# Patient Record
Sex: Female | Born: 1965
Health system: Southern US, Community
[De-identification: ages and names within clinical notes are randomized; demographics above are authoritative.]

## PROBLEM LIST (undated history)

## (undated) ENCOUNTER — Emergency Department (HOSPITAL_COMMUNITY): Discharge status: Absent without leave | Payer: Self-pay

## (undated) DIAGNOSIS — E079 Disorder of thyroid, unspecified: Secondary | ICD-10-CM

## (undated) DIAGNOSIS — F329 Major depressive disorder, single episode, unspecified: Secondary | ICD-10-CM

## (undated) DIAGNOSIS — C801 Malignant (primary) neoplasm, unspecified: Secondary | ICD-10-CM

## (undated) DIAGNOSIS — I639 Cerebral infarction, unspecified: Secondary | ICD-10-CM

## (undated) DIAGNOSIS — K5190 Ulcerative colitis, unspecified, without complications: Secondary | ICD-10-CM

## (undated) DIAGNOSIS — I10 Essential (primary) hypertension: Secondary | ICD-10-CM

## (undated) DIAGNOSIS — F32A Depression, unspecified: Secondary | ICD-10-CM

## (undated) DIAGNOSIS — B1920 Unspecified viral hepatitis C without hepatic coma: Secondary | ICD-10-CM

## (undated) HISTORY — PX: ARM WOUND REPAIR / CLOSURE: SUR1141

## (undated) HISTORY — PX: TONSILLECTOMY: SUR1361

## (undated) HISTORY — PX: COLONOSCOPY: SHX174

---

## 2002-09-14 ENCOUNTER — Emergency Department (HOSPITAL_COMMUNITY): Admission: EM | Admit: 2002-09-14 | Discharge: 2002-09-14 | Payer: Self-pay | Admitting: Emergency Medicine

## 2002-11-01 ENCOUNTER — Inpatient Hospital Stay (HOSPITAL_COMMUNITY): Admission: AD | Admit: 2002-11-01 | Discharge: 2002-11-01 | Payer: Self-pay | Admitting: Family Medicine

## 2002-11-01 ENCOUNTER — Encounter: Payer: Self-pay | Admitting: Family Medicine

## 2002-12-01 ENCOUNTER — Inpatient Hospital Stay (HOSPITAL_COMMUNITY): Admission: AD | Admit: 2002-12-01 | Discharge: 2002-12-01 | Payer: Self-pay | Admitting: *Deleted

## 2002-12-20 ENCOUNTER — Other Ambulatory Visit: Admission: RE | Admit: 2002-12-20 | Discharge: 2002-12-20 | Payer: Self-pay | Admitting: *Deleted

## 2002-12-28 ENCOUNTER — Encounter: Payer: Self-pay | Admitting: *Deleted

## 2002-12-28 ENCOUNTER — Inpatient Hospital Stay (HOSPITAL_COMMUNITY): Admission: AD | Admit: 2002-12-28 | Discharge: 2002-12-28 | Payer: Self-pay | Admitting: Obstetrics and Gynecology

## 2003-01-11 ENCOUNTER — Encounter: Admission: RE | Admit: 2003-01-11 | Discharge: 2003-01-11 | Payer: Self-pay | Admitting: *Deleted

## 2003-01-18 ENCOUNTER — Encounter: Admission: RE | Admit: 2003-01-18 | Discharge: 2003-01-18 | Payer: Self-pay | Admitting: *Deleted

## 2003-02-09 ENCOUNTER — Encounter: Admission: RE | Admit: 2003-02-09 | Discharge: 2003-02-09 | Payer: Self-pay | Admitting: Family Medicine

## 2003-02-16 ENCOUNTER — Encounter: Admission: RE | Admit: 2003-02-16 | Discharge: 2003-02-16 | Payer: Self-pay | Admitting: Family Medicine

## 2003-02-17 ENCOUNTER — Ambulatory Visit (HOSPITAL_COMMUNITY): Admission: RE | Admit: 2003-02-17 | Discharge: 2003-02-17 | Payer: Self-pay | Admitting: *Deleted

## 2003-02-23 ENCOUNTER — Encounter: Admission: RE | Admit: 2003-02-23 | Discharge: 2003-02-23 | Payer: Self-pay | Admitting: Family Medicine

## 2003-03-02 ENCOUNTER — Encounter: Admission: RE | Admit: 2003-03-02 | Discharge: 2003-03-02 | Payer: Self-pay | Admitting: *Deleted

## 2003-03-07 ENCOUNTER — Ambulatory Visit (HOSPITAL_COMMUNITY): Admission: RE | Admit: 2003-03-07 | Discharge: 2003-03-07 | Payer: Self-pay | Admitting: *Deleted

## 2003-03-09 ENCOUNTER — Encounter: Admission: RE | Admit: 2003-03-09 | Discharge: 2003-03-09 | Payer: Self-pay | Admitting: *Deleted

## 2003-03-16 ENCOUNTER — Ambulatory Visit (HOSPITAL_COMMUNITY): Admission: RE | Admit: 2003-03-16 | Discharge: 2003-03-16 | Payer: Self-pay | Admitting: *Deleted

## 2003-03-16 ENCOUNTER — Encounter: Admission: RE | Admit: 2003-03-16 | Discharge: 2003-03-16 | Payer: Self-pay | Admitting: Family Medicine

## 2003-03-17 ENCOUNTER — Emergency Department (HOSPITAL_COMMUNITY): Admission: EM | Admit: 2003-03-17 | Discharge: 2003-03-17 | Payer: Self-pay | Admitting: Emergency Medicine

## 2003-03-19 ENCOUNTER — Inpatient Hospital Stay (HOSPITAL_COMMUNITY): Admission: AD | Admit: 2003-03-19 | Discharge: 2003-03-19 | Payer: Self-pay | Admitting: *Deleted

## 2003-03-19 ENCOUNTER — Encounter: Payer: Self-pay | Admitting: *Deleted

## 2003-10-09 ENCOUNTER — Emergency Department (HOSPITAL_COMMUNITY): Admission: EM | Admit: 2003-10-09 | Discharge: 2003-10-09 | Payer: Self-pay | Admitting: *Deleted

## 2003-10-11 ENCOUNTER — Emergency Department (HOSPITAL_COMMUNITY): Admission: AD | Admit: 2003-10-11 | Discharge: 2003-10-11 | Payer: Self-pay | Admitting: Family Medicine

## 2003-10-16 ENCOUNTER — Emergency Department (HOSPITAL_COMMUNITY): Admission: AD | Admit: 2003-10-16 | Discharge: 2003-10-16 | Payer: Self-pay | Admitting: Family Medicine

## 2003-12-06 ENCOUNTER — Emergency Department (HOSPITAL_COMMUNITY): Admission: EM | Admit: 2003-12-06 | Discharge: 2003-12-06 | Payer: Self-pay | Admitting: Emergency Medicine

## 2004-06-25 ENCOUNTER — Emergency Department (HOSPITAL_COMMUNITY): Admission: EM | Admit: 2004-06-25 | Discharge: 2004-06-25 | Payer: Self-pay | Admitting: Emergency Medicine

## 2004-12-05 ENCOUNTER — Emergency Department (HOSPITAL_COMMUNITY): Admission: EM | Admit: 2004-12-05 | Discharge: 2004-12-05 | Payer: Self-pay | Admitting: Emergency Medicine

## 2005-04-22 ENCOUNTER — Inpatient Hospital Stay (HOSPITAL_COMMUNITY): Admission: AD | Admit: 2005-04-22 | Discharge: 2005-04-22 | Payer: Self-pay | Admitting: *Deleted

## 2005-05-22 ENCOUNTER — Inpatient Hospital Stay (HOSPITAL_COMMUNITY): Admission: AD | Admit: 2005-05-22 | Discharge: 2005-05-22 | Payer: Self-pay | Admitting: Family Medicine

## 2005-06-05 ENCOUNTER — Ambulatory Visit: Payer: Self-pay | Admitting: Family Medicine

## 2005-07-03 ENCOUNTER — Ambulatory Visit: Payer: Self-pay | Admitting: Family Medicine

## 2006-01-25 ENCOUNTER — Emergency Department (HOSPITAL_COMMUNITY): Admission: EM | Admit: 2006-01-25 | Discharge: 2006-01-25 | Payer: Self-pay | Admitting: *Deleted

## 2006-02-11 ENCOUNTER — Emergency Department (HOSPITAL_COMMUNITY): Admission: EM | Admit: 2006-02-11 | Discharge: 2006-02-12 | Payer: Self-pay | Admitting: *Deleted

## 2006-06-20 ENCOUNTER — Emergency Department (HOSPITAL_COMMUNITY): Admission: EM | Admit: 2006-06-20 | Discharge: 2006-06-20 | Payer: Self-pay | Admitting: Emergency Medicine

## 2006-12-16 ENCOUNTER — Inpatient Hospital Stay (HOSPITAL_COMMUNITY): Admission: AD | Admit: 2006-12-16 | Discharge: 2006-12-16 | Payer: Self-pay | Admitting: Gynecology

## 2007-01-09 ENCOUNTER — Emergency Department (HOSPITAL_COMMUNITY): Admission: EM | Admit: 2007-01-09 | Discharge: 2007-01-10 | Payer: Self-pay | Admitting: Emergency Medicine

## 2014-02-28 ENCOUNTER — Emergency Department (HOSPITAL_COMMUNITY): Payer: Self-pay

## 2014-02-28 ENCOUNTER — Emergency Department (HOSPITAL_COMMUNITY)
Admission: EM | Admit: 2014-02-28 | Discharge: 2014-02-28 | Disposition: A | Discharge status: Home / Self Care | Payer: Self-pay | Attending: Emergency Medicine | Admitting: Emergency Medicine

## 2014-02-28 ENCOUNTER — Encounter (HOSPITAL_COMMUNITY): Payer: Self-pay | Admitting: Emergency Medicine

## 2014-02-28 DIAGNOSIS — R059 Cough, unspecified: Secondary | ICD-10-CM | POA: Insufficient documentation

## 2014-02-28 DIAGNOSIS — K5190 Ulcerative colitis, unspecified, without complications: Secondary | ICD-10-CM | POA: Insufficient documentation

## 2014-02-28 DIAGNOSIS — Z8673 Personal history of transient ischemic attack (TIA), and cerebral infarction without residual deficits: Secondary | ICD-10-CM | POA: Insufficient documentation

## 2014-02-28 DIAGNOSIS — F17200 Nicotine dependence, unspecified, uncomplicated: Secondary | ICD-10-CM | POA: Insufficient documentation

## 2014-02-28 DIAGNOSIS — E079 Disorder of thyroid, unspecified: Secondary | ICD-10-CM | POA: Insufficient documentation

## 2014-02-28 DIAGNOSIS — R0982 Postnasal drip: Secondary | ICD-10-CM | POA: Insufficient documentation

## 2014-02-28 DIAGNOSIS — Z880 Allergy status to penicillin: Secondary | ICD-10-CM | POA: Insufficient documentation

## 2014-02-28 DIAGNOSIS — Z888 Allergy status to other drugs, medicaments and biological substances status: Secondary | ICD-10-CM | POA: Insufficient documentation

## 2014-02-28 DIAGNOSIS — F3289 Other specified depressive episodes: Secondary | ICD-10-CM | POA: Insufficient documentation

## 2014-02-28 DIAGNOSIS — J3489 Other specified disorders of nose and nasal sinuses: Secondary | ICD-10-CM | POA: Insufficient documentation

## 2014-02-28 DIAGNOSIS — R05 Cough: Secondary | ICD-10-CM | POA: Insufficient documentation

## 2014-02-28 DIAGNOSIS — Z8619 Personal history of other infectious and parasitic diseases: Secondary | ICD-10-CM | POA: Insufficient documentation

## 2014-02-28 DIAGNOSIS — L509 Urticaria, unspecified: Secondary | ICD-10-CM | POA: Insufficient documentation

## 2014-02-28 DIAGNOSIS — F329 Major depressive disorder, single episode, unspecified: Secondary | ICD-10-CM | POA: Insufficient documentation

## 2014-02-28 DIAGNOSIS — R6889 Other general symptoms and signs: Secondary | ICD-10-CM | POA: Insufficient documentation

## 2014-02-28 HISTORY — DX: Depression, unspecified: F32.A

## 2014-02-28 HISTORY — DX: Ulcerative colitis, unspecified, without complications: K51.90

## 2014-02-28 HISTORY — DX: Cerebral infarction, unspecified: I63.9

## 2014-02-28 HISTORY — DX: Unspecified viral hepatitis C without hepatic coma: B19.20

## 2014-02-28 HISTORY — DX: Major depressive disorder, single episode, unspecified: F32.9

## 2014-02-28 HISTORY — DX: Disorder of thyroid, unspecified: E07.9

## 2014-02-28 MED ORDER — PREDNISONE 20 MG PO TABS: 40.0000 mg | ORAL_TABLET | Freq: Every day | ORAL | Status: DC

## 2014-02-28 MED ORDER — BENZONATATE 100 MG PO CAPS: 100.0000 mg | ORAL_CAPSULE | Freq: Three times a day (TID) | ORAL | Status: DC

## 2014-02-28 MED ORDER — PREDNISONE 20 MG PO TABS
60.0000 mg | ORAL_TABLET | Freq: Once | ORAL | Status: AC
  Administered 2014-02-28: 60 mg via ORAL
  Filled 2014-02-28: qty 3

## 2014-02-28 MED ORDER — FLUTICASONE PROPIONATE HFA 44 MCG/ACT IN AERO
2.0000 | INHALATION_SPRAY | Freq: Once | RESPIRATORY_TRACT | Status: DC
  Filled 2014-02-28: qty 10.6

## 2014-02-28 MED ORDER — FLUTICASONE PROPIONATE 50 MCG/ACT NA SUSP
2.0000 | Freq: Every day | NASAL | Status: DC
  Administered 2014-02-28: 2 via NASAL
  Filled 2014-02-28 (×2): qty 16

## 2014-02-28 NOTE — ED Notes (Signed)
Pt presents with a rash/hives to her upper back/chest, abd, and bilateral arms x3 weeks. Pt reports using hydrocortisone and benadryl OTC with no relief. Pt also c/o sinus congestion and cough x3 weeks also with no relief with OTC medications.

## 2014-02-28 NOTE — ED Notes (Signed)
Pt verbalizes understanding of d/c instructions and denies any further needs at this time. 

## 2014-02-28 NOTE — ED Provider Notes (Signed)
CSN: [id number]     Arrival date & time 02/28/14  1548 History   None    This chart was scribed for non-physician practitioner, Monico Blitz  PA-C working with Ephraim Hamburger, MD by Forrestine Him, ED Scribe. This patient was seen in room TR05C/TR05C and the patient's care was started at 5:07 PM.   Chief Complaint  Patient presents with  . Rash   HPI  HPI Comments: Holly Weber is a 48 y.o. female with a PMHx of Thyroid disease and Hepatitis C who presents to the Emergency Department complaining of a pruritis rash to the upper back, chest, and arms bilaterally x 3 weeks that is unchanged. No new soaps or detergents. She also reports sinus congestion, and a cough onset 3 weeks. She has tried applying topical calamine lotion and hydrocortisone to the areas without any improvement. She Has also tried OTC Benadryl without any relief. At this time she denies any fever or chills, shortness of breath, nausea vomiting. Pt is currently an every day smoker. She has no other pertinent past medical history. No other concerns this visit.  Pt is currently not followed by a PCP  Past Medical History  Diagnosis Date  . Colitis, ulcerative   . Thyroid disease   . Hepatitis C   . Depression   . Stroke    Past Surgical History  Procedure Laterality Date  . Tonsillectomy    . Arm wound repair / closure    . Colonoscopy     History reviewed. No pertinent family history. History  Substance Use Topics  . Smoking status: Current Every Day Smoker  . Smokeless tobacco: Not on file  . Alcohol Use: No   OB History   Grav Para Term Preterm Abortions TAB SAB Ect Mult Living                 Review of Systems  Constitutional: Negative for fever and chills.  HENT: Positive for congestion.   Eyes: Negative for redness.  Respiratory: Positive for cough.   Skin: Positive for rash.  Psychiatric/Behavioral: Negative for confusion.      Allergies  Asa; Nsaids; and Penicillins  Home Medications    Prior to Admission medications   Medication Sig Start Date End Date Taking? Authorizing Provider  predniSONE (DELTASONE) 20 MG tablet Take 2 tablets (40 mg total) by mouth daily. 02/28/14   Yer Olivencia, PA-C   Triage Vitals: BP 141/83  Pulse 101  Temp(Src) 98.3 F (36.8 C) (Oral)  SpO2 95%   Physical Exam  Nursing note and vitals reviewed. Constitutional: She is oriented to person, place, and time. She appears well-developed and well-nourished. No distress.  HENT:  Head: Normocephalic and atraumatic.  Right Ear: Hearing, tympanic membrane and external ear normal.  Left Ear: Hearing, tympanic membrane and external ear normal.  Nose: Nose normal.  Mouth/Throat: Oropharynx is clear and moist.  No drooling or stridor. Posterior pharynx mildly erythematous no significant tonsillar hypertrophy. No exudate. Soft palate rises symmetrically. No TTP or induration under tongue.   No tenderness to palpation of frontal or bilateral maxillary sinuses.  Moderate erythematous mucosal edema in the nares, no scabs.  Bilateral tympanic membranes with normal architecture and good light reflex.    Eyes: Conjunctivae and EOM are normal. Pupils are equal, round, and reactive to light.  Neck: Normal range of motion.  Cardiovascular: Normal rate, regular rhythm and normal heart sounds.   Pulmonary/Chest: Effort normal and breath sounds normal. No stridor. No respiratory distress.  She has no wheezes. She has no rales. She exhibits no tenderness.  Abdominal: Soft. There is no tenderness.  Musculoskeletal: Normal range of motion.  Lymphadenopathy:    She has no cervical adenopathy.  Neurological: She is alert and oriented to person, place, and time.  Skin: Rash noted.  Hives to bilateral upper extremities and trunk  No warmth, induration, discharge  Psychiatric: She has a normal mood and affect.    ED Course  Procedures (including critical care time)  DIAGNOSTIC STUDIES: Oxygen Saturation  is 95% on RA, Adequate by my interpretation.    COORDINATION OF CARE: 5:18 PM-Discussed treatment plan with pt at bedside and pt agreed to plan.     Labs Review Labs Reviewed - No data to display  Imaging Review Dg Chest 2 View  02/28/2014   CLINICAL DATA:  Shortness of breath, history of asthma  EXAM: CHEST  2 VIEW  COMPARISON:  12/06/2003  FINDINGS: Cardiomediastinal silhouette is stable. No acute infiltrate or pleural effusion. No pulmonary edema. Mild degenerative changes thoracic spine.  IMPRESSION: No active cardiopulmonary disease.   Electronically Signed   By: Lahoma Crocker M.D.   On: 02/28/2014 16:48     EKG Interpretation None      MDM   Final diagnoses:  Hives  Post-nasal drip  Rhinorrhea  Tobacco use disorder     Filed Vitals:   02/28/14 1558  BP: 141/83  Pulse: 101  Temp: 98.3 F (36.8 C)  TempSrc: Oral  SpO2: 95%    Medications  predniSONE (DELTASONE) tablet 60 mg (not administered)  fluticasone (FLOVENT HFA) 44 MCG/ACT inhaler 2 puff (not administered)    Lavada Weber is a 48 y.o. female presenting with hives and cough. Lung sounds are clear, x-ray without infiltrate. Advised smoking cessation. Posterior pharynx is mildly injected consistent with a post nasal drip.  Evaluation does not show pathology that would require ongoing emergent intervention or inpatient treatment. Pt is hemodynamically stable and mentating appropriately. Discussed findings and plan with patient/guardian, who agrees with care plan. All questions answered. Return precautions discussed and outpatient follow up given.   New Prescriptions   PREDNISONE (DELTASONE) 20 MG TABLET    Take 2 tablets (40 mg total) by mouth daily.       I personally performed the services described in this documentation, which was scribed in my presence. The recorded information has been reviewed and is accurate.    Monico Blitz, PA-C 02/28/14 1720

## 2014-02-28 NOTE — Discharge Instructions (Signed)
1 to 2 tablets of 25 mg Benadryl pills every 4-6 hours as needed to a maximum of 300 mg per day. In addition, you may apply a topical hydrocortisone ointment to all affected areas except for the face.   Do not hesitate to call 911 or return to the emergency room if you develop any shortness of breath, wheezing, tongue or lip swelling.  Do not hesitate to return to the Emergency Department for any new, worsening or concerning symptoms.   If you do not have a primary care doctor you can establish one at the   [facility]: [address] [place] [phone]  After you establish care. Let them know you were seen in the emergency room. They must obtain records for further management.

## 2014-03-01 NOTE — ED Provider Notes (Signed)
Medical screening examination/treatment/procedure(s) were performed by non-physician practitioner and as supervising physician I was immediately available for consultation/collaboration.   EKG Interpretation None        Ephraim Hamburger, MD 03/01/14 1539

## 2014-03-01 NOTE — Discharge Planning (Signed)
[place] Lia[facility] was not able to see patient, [facility] orange card information and resource guide will be mailed to the address listed.

## 2014-03-28 ENCOUNTER — Ambulatory Visit: Payer: Self-pay

## 2014-07-20 ENCOUNTER — Ambulatory Visit (HOSPITAL_COMMUNITY): Payer: Self-pay | Attending: Emergency Medicine

## 2014-07-20 ENCOUNTER — Emergency Department (INDEPENDENT_AMBULATORY_CARE_PROVIDER_SITE_OTHER)
Admission: EM | Admit: 2014-07-20 | Discharge: 2014-07-20 | Disposition: A | Discharge status: Home / Self Care | Payer: Self-pay | Source: Home / Self Care | Attending: Family Medicine | Admitting: Family Medicine

## 2014-07-20 ENCOUNTER — Encounter (HOSPITAL_COMMUNITY): Payer: Self-pay | Admitting: Emergency Medicine

## 2014-07-20 DIAGNOSIS — R079 Chest pain, unspecified: Secondary | ICD-10-CM | POA: Insufficient documentation

## 2014-07-20 DIAGNOSIS — R059 Cough, unspecified: Secondary | ICD-10-CM

## 2014-07-20 DIAGNOSIS — R05 Cough: Secondary | ICD-10-CM

## 2014-07-20 DIAGNOSIS — J40 Bronchitis, not specified as acute or chronic: Secondary | ICD-10-CM

## 2014-07-20 MED ORDER — ALBUTEROL SULFATE (2.5 MG/3ML) 0.083% IN NEBU
INHALATION_SOLUTION | RESPIRATORY_TRACT | Status: AC
  Filled 2014-07-20: qty 6

## 2014-07-20 MED ORDER — PREDNISONE 10 MG PO TABS: ORAL_TABLET | ORAL | Status: DC

## 2014-07-20 MED ORDER — BENZONATATE 200 MG PO CAPS: 200.0000 mg | ORAL_CAPSULE | Freq: Three times a day (TID) | ORAL | Status: DC | PRN

## 2014-07-20 MED ORDER — IPRATROPIUM BROMIDE 0.02 % IN SOLN
0.5000 mg | Freq: Once | RESPIRATORY_TRACT | Status: AC
  Administered 2014-07-20: 0.5 mg via RESPIRATORY_TRACT

## 2014-07-20 MED ORDER — IPRATROPIUM BROMIDE 0.02 % IN SOLN
RESPIRATORY_TRACT | Status: AC
  Filled 2014-07-20: qty 2.5

## 2014-07-20 MED ORDER — ALBUTEROL SULFATE (2.5 MG/3ML) 0.083% IN NEBU
5.0000 mg | INHALATION_SOLUTION | Freq: Once | RESPIRATORY_TRACT | Status: AC
  Administered 2014-07-20: 5 mg via RESPIRATORY_TRACT

## 2014-07-20 MED ORDER — AZITHROMYCIN 250 MG PO TABS: ORAL_TABLET | ORAL | Status: DC

## 2014-07-20 NOTE — ED Provider Notes (Signed)
CSN: [id number]     Arrival date & time 07/20/14  1337 History   First MD Initiated Contact with Patient 07/20/14 1405     Chief Complaint  Patient presents with  . URI   (Consider location/radiation/quality/duration/timing/severity/associated sxs/prior Treatment) HPI       48 year old female presents for evaluation of headache, cough, congestion, shortness of breath, sore throat, chest tightness with coughing, all for 1 month. She says her symptoms have been intermittent however. She feels like she starts to get better but then will get worse again. She is taking numerous over-the-counter medications recommended by her pharmacist without relief of her symptoms. She thinks she has also pulled a muscle in her back from coughing too hard. She has a a few episodes of posttussive vomiting. No fever, chills. No recent travel or sick contacts. She has a history of ulcerative colitis, hepatitis, and stroke.  Past Medical History  Diagnosis Date  . Colitis, ulcerative   . Thyroid disease   . Hepatitis C   . Depression   . Stroke    Past Surgical History  Procedure Laterality Date  . Tonsillectomy    . Arm wound repair / closure    . Colonoscopy     No family history on file. History  Substance Use Topics  . Smoking status: Current Every Day Smoker  . Smokeless tobacco: Not on file  . Alcohol Use: No   OB History   Grav Para Term Preterm Abortions TAB SAB Ect Mult Living                 Review of Systems  Constitutional: Positive for fatigue. Negative for fever and chills.  HENT: Positive for congestion, postnasal drip, rhinorrhea and sore throat. Negative for ear pain, hearing loss, nosebleeds and sinus pressure.   Eyes: Negative for visual disturbance.  Respiratory: Positive for cough, chest tightness, shortness of breath and wheezing.   Cardiovascular: Negative for chest pain and leg swelling.  Gastrointestinal: Positive for vomiting. Negative for nausea, abdominal pain and  diarrhea.  Endocrine: Negative for polydipsia and polyuria.  Genitourinary: Negative for dysuria, urgency, hematuria and flank pain.  Musculoskeletal: Negative for arthralgias and myalgias.  Skin: Negative for rash.  Neurological: Negative for weakness and numbness.  Hematological: Negative for adenopathy.  All other systems reviewed and are negative.   Allergies  Asa; Nsaids; and Penicillins  Home Medications   Prior to Admission medications   Medication Sig Start Date End Date Taking? Authorizing Provider  Chlorphen-Phenyleph-ASA (ALKA-SELTZER PLUS COLD PO) Take by mouth.   Yes Historical Provider, MD  guaifenesin (ROBITUSSIN) 100 MG/5ML syrup Take 200 mg by mouth 3 (three) times daily as needed for cough.   Yes Historical Provider, MD  azithromycin (ZITHROMAX Z-PAK) 250 MG tablet Use as directed 07/20/14   Liam Graham, PA-C  benzonatate (TESSALON) 100 MG capsule Take 1 capsule (100 mg total) by mouth every 8 (eight) hours. 02/28/14   Nicole Pisciotta, PA-C  benzonatate (TESSALON) 200 MG capsule Take 1 capsule (200 mg total) by mouth 3 (three) times daily as needed for cough. 07/20/14   Freeman Caldron Etheleen Valtierra, PA-C  predniSONE (DELTASONE) 10 MG tablet 4 tabs PO QD for 4 days; 3 tabs PO QD for 3 days; 2 tabs PO QD for 2 days; 1 tab PO QD for 1 day 07/20/14   Liam Graham, PA-C  predniSONE (DELTASONE) 20 MG tablet Take 2 tablets (40 mg total) by mouth daily. 02/28/14   Monico Blitz, PA-C  BP 92/56  Pulse 96  Temp(Src) 98.1 F (36.7 C) (Oral)  Resp 24  SpO2 96% Physical Exam  Nursing note and vitals reviewed. Constitutional: She is oriented to person, place, and time. Vital signs are normal. She appears well-developed and well-nourished. No distress.  HENT:  Head: Normocephalic and atraumatic.  Right Ear: External ear normal.  Left Ear: External ear normal.  Nose: Nose normal. Right sinus exhibits no maxillary sinus tenderness and no frontal sinus tenderness. Left sinus exhibits  no maxillary sinus tenderness and no frontal sinus tenderness.  Mouth/Throat: Uvula is midline, oropharynx is clear and moist and mucous membranes are normal. No oropharyngeal exudate or posterior oropharyngeal erythema.  Eyes: Conjunctivae are normal. Right eye exhibits no discharge. Left eye exhibits no discharge.  Neck: Normal range of motion. Neck supple. No JVD present.  Cardiovascular: Normal rate, regular rhythm, normal heart sounds and intact distal pulses.   Pulmonary/Chest: Effort normal. Not tachypneic. No respiratory distress. She has no decreased breath sounds. She has wheezes (diffuse, inspiratory and expiratory ). She has no rhonchi. She has no rales.  Lymphadenopathy:    She has no cervical adenopathy.  Neurological: She is alert and oriented to person, place, and time. She has normal strength. Coordination normal.  Skin: Skin is warm and dry. No rash noted. She is not diaphoretic.  Psychiatric: She has a normal mood and affect. Judgment normal.    ED Course  Procedures (including critical care time) Labs Review Labs Reviewed - No data to display  Imaging Review Dg Chest 2 View  07/20/2014   CLINICAL DATA:  Cough and chest pain for 1 month  EXAM: CHEST  2 VIEW  COMPARISON:  02/28/2014  FINDINGS: The heart size and mediastinal contours are within normal limits. Both lungs are clear. The visualized skeletal structures are unremarkable.  IMPRESSION: No active cardiopulmonary disease.   Electronically Signed   By: Inez Catalina M.D.   On: 07/20/2014 15:56     MDM   1. Cough   2. Bronchitis    Chest x-ray is normal. Treat symptomatically as well as with azithromycin due to the duration of symptoms beyond 3 weeks. Return precautions discussed with patient  Medications  albuterol (PROVENTIL) (2.5 MG/3ML) 0.083% nebulizer solution 5 mg (5 mg Nebulization Given 07/20/14 1459)  ipratropium (ATROVENT) nebulizer solution 0.5 mg (0.5 mg Nebulization Given 07/20/14 1459)    Discharge Medication List as of 07/20/2014  4:21 PM    START taking these medications   Details  azithromycin (ZITHROMAX Z-PAK) 250 MG tablet Use as directed, Print    !! benzonatate (TESSALON) 200 MG capsule Take 1 capsule (200 mg total) by mouth 3 (three) times daily as needed for cough., Starting 07/20/2014, Until Discontinued, Print    !! predniSONE (DELTASONE) 10 MG tablet 4 tabs PO QD for 4 days; 3 tabs PO QD for 3 days; 2 tabs PO QD for 2 days; 1 tab PO QD for 1 day, Print     !! - Potential duplicate medications found. Please discuss with provider.         Liam Graham, PA-C 07/20/14 [phone]

## 2014-07-20 NOTE — Discharge Instructions (Signed)
Cough, Adult  A cough is a reflex that helps clear your throat and airways. It can help heal the body or may be a reaction to an irritated airway. A cough may only last 2 or 3 weeks (acute) or may last more than 8 weeks (chronic).  CAUSES Acute cough:  Viral or bacterial infections. Chronic cough:  Infections.  Allergies.  Asthma.  Post-nasal drip.  Smoking.  Heartburn or acid reflux.  Some medicines.  Chronic lung problems (COPD).  Cancer. SYMPTOMS   Cough.  Fever.  Chest pain.  Increased breathing rate.  High-pitched whistling sound when breathing (wheezing).  Colored mucus that you cough up (sputum). TREATMENT   A bacterial cough may be treated with antibiotic medicine.  A viral cough must run its course and will not respond to antibiotics.  Your caregiver may recommend other treatments if you have a chronic cough. HOME CARE INSTRUCTIONS   Only take over-the-counter or prescription medicines for pain, discomfort, or fever as directed by your caregiver. Use cough suppressants only as directed by your caregiver.  Use a cold steam vaporizer or humidifier in your bedroom or home to help loosen secretions.  Sleep in a semi-upright position if your cough is worse at night.  Rest as needed.  Stop smoking if you smoke. SEEK IMMEDIATE MEDICAL CARE IF:   You have pus in your sputum.  Your cough starts to worsen.  You cannot control your cough with suppressants and are losing sleep.  You begin coughing up blood.  You have difficulty breathing.  You develop pain which is getting worse or is uncontrolled with medicine.  You have a fever. MAKE SURE YOU:   Understand these instructions.  Will watch your condition.  Will get help right away if you are not doing well or get worse. Document Released: 03/07/2011 Document Revised: 12/01/2011 Document Reviewed: 03/07/2011 Exit[employer] Patient Information 2015 [employer]. This information is not intended  to replace advice given to you by your health care provider. Make sure you discuss any questions you have with your health care provider.  

## 2014-07-20 NOTE — ED Notes (Signed)
Patient transferred to [facility] for x ray

## 2014-07-20 NOTE — ED Notes (Signed)
Cough is very forceful, sometimes resulting in vomiting and patient thinks she has a pulled muscle from coughing

## 2014-07-20 NOTE — ED Notes (Signed)
Headache, sneezing, coughing, runny nose and sob.  Reports white phlegm, no fever

## 2015-08-20 ENCOUNTER — Emergency Department (INDEPENDENT_AMBULATORY_CARE_PROVIDER_SITE_OTHER)
Admission: EM | Admit: 2015-08-20 | Discharge: 2015-08-20 | Disposition: A | Discharge status: Home / Self Care | Payer: Self-pay | Source: Home / Self Care

## 2015-08-20 ENCOUNTER — Encounter (HOSPITAL_COMMUNITY): Payer: Self-pay | Admitting: Emergency Medicine

## 2015-08-20 ENCOUNTER — Emergency Department (INDEPENDENT_AMBULATORY_CARE_PROVIDER_SITE_OTHER): Payer: Self-pay

## 2015-08-20 DIAGNOSIS — M79644 Pain in right finger(s): Secondary | ICD-10-CM

## 2015-08-20 DIAGNOSIS — K0889 Other specified disorders of teeth and supporting structures: Secondary | ICD-10-CM

## 2015-08-20 MED ORDER — METRONIDAZOLE 500 MG PO TABS: 500.0000 mg | ORAL_TABLET | Freq: Two times a day (BID) | ORAL | Status: DC

## 2015-08-20 MED ORDER — TRAMADOL HCL 50 MG PO TABS: 50.0000 mg | ORAL_TABLET | Freq: Four times a day (QID) | ORAL | Status: DC | PRN

## 2015-08-20 NOTE — ED Notes (Signed)
Pt here with c/o right hand index finger, unsure of reason of pain Swelling and unable fully bend Pt also returns with worsening cough unrelieved by prescriptions given
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
## 2015-08-20 NOTE — Discharge Instructions (Signed)
Musculoskeletal Pain Musculoskeletal pain is muscle and boney aches and pains. These pains can occur in any part of the body. Your caregiver may treat you without knowing the cause of the pain. They may treat you if blood or urine tests, X-rays, and other tests were normal.  CAUSES There is often not a definite cause or reason for these pains. These pains may be caused by a type of germ (virus). The discomfort may also come from overuse. Overuse includes working out too hard when your body is not fit. Boney aches also come from weather changes. Bone is sensitive to atmospheric pressure changes. HOME CARE INSTRUCTIONS   Ask when your test results will be ready. Make sure you get your test results.  Only take over-the-counter or prescription medicines for pain, discomfort, or fever as directed by your caregiver. If you were given medications for your condition, do not drive, operate machinery or power tools, or sign legal documents for 24 hours. Do not drink alcohol. Do not take sleeping pills or other medications that may interfere with treatment.  Continue all activities unless the activities cause more pain. When the pain lessens, slowly resume normal activities. Gradually increase the intensity and duration of the activities or exercise.  During periods of severe pain, bed rest may be helpful. Lay or sit in any position that is comfortable.  Putting ice on the injured area.  Put ice in a bag.  Place a towel between your skin and the bag.  Leave the ice on for 15 to 20 minutes, 3 to 4 times a day.  Follow up with your caregiver for continued problems and no reason can be found for the pain. If the pain becomes worse or does not go away, it may be necessary to repeat tests or do additional testing. Your caregiver may need to look further for a possible cause. SEEK IMMEDIATE MEDICAL CARE IF:  You have pain that is getting worse and is not relieved by medications.  You develop chest pain  that is associated with shortness or breath, sweating, feeling sick to your stomach (nauseous), or throw up (vomit).  Your pain becomes localized to the abdomen.  You develop any new symptoms that seem different or that concern you. MAKE SURE YOU:   Understand these instructions.  Will watch your condition.  Will get help right away if you are not doing well or get worse.   This information is not intended to replace advice given to you by your health care provider. Make sure you discuss any questions you have with your health care provider.   Document Released: 09/08/2005 Document Revised: 12/01/2011 Document Reviewed: 05/13/2013 Elsevier Interactive Patient Education 2016 [place] Pain Dental pain may be caused by many things, including:  Tooth decay (cavities or caries). Cavities cause the nerve of your tooth to be open to air and hot or cold temperatures. This can cause pain or discomfort.  Abscess or infection. A dental abscess is an area that is full of infected pus from a bacterial infection in the inner part of the tooth (pulp). It usually happens at the end of the tooth's root.  Injury.  An unknown reason (idiopathic). Your pain may be mild or severe. It may only happen when:  You are chewing.  You are exposed to hot or cold temperature.  You are eating or drinking sugary foods or beverages, such as:  Soda.  Candy. Your pain may also be there all of the time. HOME CARE  Watch your dental pain for any changes. Do these things to lessen your discomfort:  Take medicines only as told by your dentist.  If your dentist tells you to take an antibiotic medicine, finish all of it even if you start to feel better.  Keep all follow-up visits as told by your dentist. This is important.  Do not apply heat to the outside of your face.  Rinse your mouth or gargle with salt water if told by your dentist. This helps with pain and swelling.  You can make salt  water by adding  tsp of salt to 1 cup of warm water.  Apply ice to the painful area of your face:  Put ice in a plastic bag.  Place a towel between your skin and the bag.  Leave the ice on for 20 minutes, 2-3 times per day.  Avoid foods or drinks that cause you pain, such as:  Very hot or very cold foods or drinks.  Sweet or sugary foods or drinks. GET HELP IF:  Your pain is not helped with medicines.  Your symptoms are worse.  You have new symptoms. GET HELP RIGHT AWAY IF:  You cannot open your mouth.  You are having trouble breathing or swallowing.  You have a fever.  Your face, neck, or jaw is puffy (swollen).   This information is not intended to replace advice given to you by your health care provider. Make sure you discuss any questions you have with your health care provider.   Document Released: 02/25/2008 Document Revised: 01/23/2015 Document Reviewed: 09/04/2014 Elsevier Interactive Patient Education [employer].

## 2015-11-01 ENCOUNTER — Emergency Department (HOSPITAL_COMMUNITY)
Admission: EM | Admit: 2015-11-01 | Discharge: 2015-11-01 | Disposition: A | Discharge status: Home / Self Care | Payer: Self-pay | Attending: Emergency Medicine | Admitting: Emergency Medicine

## 2015-11-01 ENCOUNTER — Encounter (HOSPITAL_COMMUNITY): Payer: Self-pay

## 2015-11-01 ENCOUNTER — Emergency Department (HOSPITAL_COMMUNITY): Payer: Self-pay

## 2015-11-01 DIAGNOSIS — Z8719 Personal history of other diseases of the digestive system: Secondary | ICD-10-CM | POA: Insufficient documentation

## 2015-11-01 DIAGNOSIS — Z8639 Personal history of other endocrine, nutritional and metabolic disease: Secondary | ICD-10-CM | POA: Insufficient documentation

## 2015-11-01 DIAGNOSIS — F329 Major depressive disorder, single episode, unspecified: Secondary | ICD-10-CM | POA: Insufficient documentation

## 2015-11-01 DIAGNOSIS — Z792 Long term (current) use of antibiotics: Secondary | ICD-10-CM | POA: Insufficient documentation

## 2015-11-01 DIAGNOSIS — S60222A Contusion of left hand, initial encounter: Secondary | ICD-10-CM | POA: Insufficient documentation

## 2015-11-01 DIAGNOSIS — W2203XA Walked into furniture, initial encounter: Secondary | ICD-10-CM | POA: Insufficient documentation

## 2015-11-01 DIAGNOSIS — Y9289 Other specified places as the place of occurrence of the external cause: Secondary | ICD-10-CM | POA: Insufficient documentation

## 2015-11-01 DIAGNOSIS — Y998 Other external cause status: Secondary | ICD-10-CM | POA: Insufficient documentation

## 2015-11-01 DIAGNOSIS — Z8673 Personal history of transient ischemic attack (TIA), and cerebral infarction without residual deficits: Secondary | ICD-10-CM | POA: Insufficient documentation

## 2015-11-01 DIAGNOSIS — F17200 Nicotine dependence, unspecified, uncomplicated: Secondary | ICD-10-CM | POA: Insufficient documentation

## 2015-11-01 DIAGNOSIS — Y9389 Activity, other specified: Secondary | ICD-10-CM | POA: Insufficient documentation

## 2015-11-01 DIAGNOSIS — Z8619 Personal history of other infectious and parasitic diseases: Secondary | ICD-10-CM | POA: Insufficient documentation

## 2015-11-01 DIAGNOSIS — Z7952 Long term (current) use of systemic steroids: Secondary | ICD-10-CM | POA: Insufficient documentation

## 2015-11-01 DIAGNOSIS — Z880 Allergy status to penicillin: Secondary | ICD-10-CM | POA: Insufficient documentation

## 2015-11-01 MED ORDER — OXYCODONE-ACETAMINOPHEN 5-325 MG PO TABS
ORAL_TABLET | ORAL | Status: AC
  Filled 2015-11-01: qty 1

## 2015-11-01 MED ORDER — OXYCODONE-ACETAMINOPHEN 5-325 MG PO TABS
1.0000 | ORAL_TABLET | Freq: Once | ORAL | Status: AC
  Administered 2015-11-01: 1 via ORAL

## 2015-11-01 NOTE — Discharge Instructions (Signed)
Hand Contusion Holly Weber, continue to use ice packs, take Tylenol, and elevate your hand for treatment. See a primary care physician within 3 days for close follow-up. The x-rays did not show any broken bones. Come back to emergency department immediately for any worsening pain or symptoms. Thank you.  A hand contusion is a deep bruise to the hand. Contusions happen when an injury causes bleeding under the skin. Signs of bruising include pain, puffiness (swelling), and discolored skin. The contusion may turn blue, purple, or yellow. HOME CARE  Put ice on the injured area.  Put ice in a plastic bag.  Place a towel between your skin and the bag.  Leave the ice on for 15-20 minutes, 03-04 times a day.  Only take medicines as told by your doctor.  Use an elastic wrap only as told. You may remove the wrap for sleeping, showering, and bathing. Take the wrap off if you lose feeling (have numbness) in your fingers, or they turn blue or cold. Put the wrap on more loosely.  Keep the hand raised (elevated) with pillows.  Avoid using your hand too much if it is painful. GET HELP RIGHT AWAY IF:   You have more redness, puffiness, or pain in your hand.  Your puffiness or pain does not get better with medicine.  You lose feeling in your hand, or you cannot move your fingers.  Your hand turns cold or blue.  You have pain when you move your fingers.  Your hand feels warm.  Your contusion does not get better in 2 days. MAKE SURE YOU:   Understand these instructions.  Will watch this condition.  Will get help right away if you are not doing well or you get worse.   This information is not intended to replace advice given to you by your health care provider. Make sure you discuss any questions you have with your health care provider.   Document Released: 02/25/2008 Document Revised: 09/29/2014 Document Reviewed: 03/01/2012 Elsevier Interactive Patient Education [employer].

## 2015-11-01 NOTE — ED Provider Notes (Signed)
CSN: [id number]     Arrival date & time 11/01/15  0345 History   First MD Initiated Contact with Patient 11/01/15 [phone]     Chief Complaint  Patient presents with  . Hand Injury     (Consider location/radiation/quality/duration/timing/severity/associated sxs/prior Treatment) HPI  Holly Weber is a 50 y.o. female withhim past medical history presenting today with left hand pain. Patient was recently evaluated with her son, she states that she struck her hand on the table. She has pain and swelling to her fourth and fifth digits. She is concerned for possible fracture. She took ibuprofen without any significant pain relief. She denies injury elsewhere. There are no further complaints.  10 Systems reviewed and are negative for acute change except as noted in the HPI.     Past Medical History  Diagnosis Date  . Colitis, ulcerative ([place])   . Thyroid disease   . Hepatitis C   . Depression   . Stroke [facility])    Past Surgical History  Procedure Laterality Date  . Tonsillectomy    . Arm wound repair / closure    . Colonoscopy     History reviewed. No pertinent family history. Social History  Substance Use Topics  . Smoking status: Current Every Day Smoker  . Smokeless tobacco: None  . Alcohol Use: No   OB History    No data available     Review of Systems    Allergies  Asa; Nsaids; and Penicillins  Home Medications   Prior to Admission medications   Medication Sig Start Date End Date Taking? Authorizing Provider  azithromycin (ZITHROMAX Z-PAK) 250 MG tablet Use as directed 07/20/14   Liam Graham, PA-C  benzonatate (TESSALON) 100 MG capsule Take 1 capsule (100 mg total) by mouth every 8 (eight) hours. 02/28/14   Nicole Pisciotta, PA-C  benzonatate (TESSALON) 200 MG capsule Take 1 capsule (200 mg total) by mouth 3 (three) times daily as needed for cough. 07/20/14   Freeman Caldron Baker, PA-C  Chlorphen-Phenyleph-ASA (ALKA-SELTZER PLUS COLD PO) Take by mouth.    Historical  Provider, MD  guaifenesin (ROBITUSSIN) 100 MG/5ML syrup Take 200 mg by mouth 3 (three) times daily as needed for cough.    Historical Provider, MD  metroNIDAZOLE (FLAGYL) 500 MG tablet Take 1 tablet (500 mg total) by mouth 2 (two) times daily. 08/20/15   Konrad Felix, PA  predniSONE (DELTASONE) 10 MG tablet 4 tabs PO QD for 4 days; 3 tabs PO QD for 3 days; 2 tabs PO QD for 2 days; 1 tab PO QD for 1 day 07/20/14   Liam Graham, PA-C  predniSONE (DELTASONE) 20 MG tablet Take 2 tablets (40 mg total) by mouth daily. 02/28/14   Nicole Pisciotta, PA-C  traMADol (ULTRAM) 50 MG tablet Take 1 tablet (50 mg total) by mouth every 6 (six) hours as needed. 08/20/15   Konrad Felix, PA   BP 156/85 mmHg  Pulse 95  Temp(Src) 98.1 F (36.7 C) (Oral)  Resp 18  SpO2 96% Physical Exam  Constitutional: She is oriented to person, place, and time. She appears well-developed and well-nourished. No distress.  HENT:  Head: Normocephalic and atraumatic.  Nose: Nose normal.  Mouth/Throat: Oropharynx is clear and moist. No oropharyngeal exudate.  Eyes: Conjunctivae and EOM are normal. Pupils are equal, round, and reactive to light. No scleral icterus.  Neck: Normal range of motion. Neck supple. No JVD present. No tracheal deviation present. No thyromegaly present.  Cardiovascular: Normal rate,  regular rhythm and normal heart sounds.  Exam reveals no gallop and no friction rub.   No murmur heard. Pulmonary/Chest: Effort normal and breath sounds normal. No respiratory distress. She has no wheezes. She exhibits no tenderness.  Abdominal: Soft. Bowel sounds are normal. She exhibits no distension and no mass. There is no tenderness. There is no rebound and no guarding.  Musculoskeletal: Normal range of motion. She exhibits edema and tenderness.  Dorsum of the left hand over the fourth and fifth metacarpals reveal significant swelling and tenderness to palpation. There is no warmth or erythema. No break in the skin.   Lymphadenopathy:    She has no cervical adenopathy.  Neurological: She is alert and oriented to person, place, and time. No cranial nerve deficit. She exhibits normal muscle tone.  Skin: Skin is warm and dry. No rash noted. No erythema. No pallor.  Nursing note and vitals reviewed.   ED Course  Procedures (including critical care time) Labs Review Labs Reviewed - No data to display  Imaging Review Dg Hand Complete Left  11/01/2015  CLINICAL DATA:  Hit countertop with left hand, with pain and swelling about the fourth and fifth metacarpals. Initial encounter. EXAM: LEFT HAND - COMPLETE 3+ VIEW COMPARISON:  None. FINDINGS: There is no evidence of fracture or dislocation. The joint spaces are preserved. The carpal rows are intact, and demonstrate normal alignment. Mild soft tissue swelling is noted about the ulnar aspect of the hand. IMPRESSION: No evidence of fracture or dislocation. Electronically Signed   By: Garald Balding M.D.   On: 11/01/2015 04:37   I have personally reviewed and evaluated these images and lab results as part of my medical decision-making.   EKG Interpretation None      MDM   Final diagnoses:  Hand contusion, left, initial encounter    Patient presents emergency department for hand pain. X-rays are negative for fractures. Patient advised to continue ice, ibuprofen (she states she took this despite her allergy list) or Tylenol, and to elevate the hand for treatment. Primary care follow-up was advised within 3 days. She appears well and in no acute distress, signs within her normal limits and she is safe for discharge.  Everlene Balls, MD 11/01/15 [phone]
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
## 2015-11-01 NOTE — ED Notes (Signed)
Pt left with all her belongings and ambulated out of the treatment area.  

## 2015-11-11 ENCOUNTER — Encounter (HOSPITAL_COMMUNITY): Payer: Self-pay | Admitting: Emergency Medicine

## 2015-11-11 ENCOUNTER — Emergency Department (HOSPITAL_COMMUNITY)
Admission: EM | Admit: 2015-11-11 | Discharge: 2015-11-12 | Disposition: A | Discharge status: Home / Self Care | Payer: Self-pay | Attending: Emergency Medicine | Admitting: Emergency Medicine

## 2015-11-11 DIAGNOSIS — L02512 Cutaneous abscess of left hand: Secondary | ICD-10-CM | POA: Insufficient documentation

## 2015-11-11 DIAGNOSIS — Z8719 Personal history of other diseases of the digestive system: Secondary | ICD-10-CM | POA: Insufficient documentation

## 2015-11-11 DIAGNOSIS — Z8673 Personal history of transient ischemic attack (TIA), and cerebral infarction without residual deficits: Secondary | ICD-10-CM | POA: Insufficient documentation

## 2015-11-11 DIAGNOSIS — Z8639 Personal history of other endocrine, nutritional and metabolic disease: Secondary | ICD-10-CM | POA: Insufficient documentation

## 2015-11-11 DIAGNOSIS — Z8619 Personal history of other infectious and parasitic diseases: Secondary | ICD-10-CM | POA: Insufficient documentation

## 2015-11-11 DIAGNOSIS — Z880 Allergy status to penicillin: Secondary | ICD-10-CM | POA: Insufficient documentation

## 2015-11-11 DIAGNOSIS — F17200 Nicotine dependence, unspecified, uncomplicated: Secondary | ICD-10-CM | POA: Insufficient documentation

## 2015-11-11 DIAGNOSIS — L03114 Cellulitis of left upper limb: Secondary | ICD-10-CM | POA: Insufficient documentation

## 2015-11-11 DIAGNOSIS — L02519 Cutaneous abscess of unspecified hand: Secondary | ICD-10-CM

## 2015-11-11 DIAGNOSIS — Z8659 Personal history of other mental and behavioral disorders: Secondary | ICD-10-CM | POA: Insufficient documentation

## 2015-11-11 NOTE — ED Notes (Signed)
Pt states that she hit her hand on something several weeks ago and now it is infected appearing. Area is swollen and scabbed over. States she cut it herself at home several weeks ago and pus came out. Alert and oriented.

## 2015-11-12 ENCOUNTER — Emergency Department (HOSPITAL_COMMUNITY): Payer: Self-pay

## 2015-11-12 LAB — CBC WITH DIFFERENTIAL/PLATELET
BASOS ABS: 0.1 10*3/uL (ref 0.0–0.1)
BASOS PCT: 1 %
EOS PCT: 2 %
Eosinophils Absolute: 0.2 10*3/uL (ref 0.0–0.7)
HCT: 46.3 % — ABNORMAL HIGH (ref 36.0–46.0)
HEMOGLOBIN: 14.9 g/dL (ref 12.0–15.0)
LYMPHS ABS: 3.2 10*3/uL (ref 0.7–4.0)
Lymphocytes Relative: 39 %
MCH: 27.2 pg (ref 26.0–34.0)
MCHC: 32.2 g/dL (ref 30.0–36.0)
MCV: 84.5 fL (ref 78.0–100.0)
Monocytes Absolute: 0.7 10*3/uL (ref 0.1–1.0)
Monocytes Relative: 8 %
NEUTROS PCT: 50 %
Neutro Abs: 4.2 10*3/uL (ref 1.7–7.7)
Platelets: 349 10*3/uL (ref 150–400)
RBC: 5.48 MIL/uL — AB (ref 3.87–5.11)
RDW: 14.1 % (ref 11.5–15.5)
WBC: 8.3 10*3/uL (ref 4.0–10.5)

## 2015-11-12 LAB — BASIC METABOLIC PANEL
Anion gap: 11 (ref 5–15)
BUN: 10 mg/dL (ref 6–20)
CHLORIDE: 103 mmol/L (ref 101–111)
CO2: 26 mmol/L (ref 22–32)
Calcium: 9.6 mg/dL (ref 8.9–10.3)
Creatinine, Ser: 0.6 mg/dL (ref 0.44–1.00)
GFR calc non Af Amer: >60 mL/min (ref >60)
Glucose, Bld: 159 mg/dL — ABNORMAL HIGH (ref 65–99)
POTASSIUM: 3.9 mmol/L (ref 3.5–5.1)
Sodium: 140 mmol/L (ref 135–145)

## 2015-11-12 MED ORDER — HYDROCODONE-ACETAMINOPHEN 5-325 MG PO TABS
2.0000 | ORAL_TABLET | Freq: Once | ORAL | Status: AC
  Administered 2015-11-12: 2 via ORAL
  Filled 2015-11-12: qty 2

## 2015-11-12 MED ORDER — CLINDAMYCIN PHOSPHATE 600 MG/50ML IV SOLN
600.0000 mg | Freq: Once | INTRAVENOUS | Status: AC
  Administered 2015-11-12: 600 mg via INTRAVENOUS
  Filled 2015-11-12: qty 50

## 2015-11-12 MED ORDER — HYDROCODONE-ACETAMINOPHEN 5-325 MG PO TABS: 1.0000 | ORAL_TABLET | Freq: Four times a day (QID) | ORAL | Status: DC | PRN

## 2015-11-12 MED ORDER — LIDOCAINE HCL 2 % IJ SOLN
5.0000 mL | Freq: Once | INTRAMUSCULAR | Status: AC
  Administered 2015-11-12: 100 mg
  Filled 2015-11-12: qty 20

## 2015-11-12 MED ORDER — CLINDAMYCIN HCL 150 MG PO CAPS: 300.0000 mg | ORAL_CAPSULE | Freq: Four times a day (QID) | ORAL | Status: DC

## 2015-11-12 NOTE — Consult Note (Signed)
Reason for Consult:infection L hand Referring Physician: ER  CC:My hand is infected  HPI:  Holly Weber is an 50 y.o. right handed female who presents with pain, swelling, redness L dorsal hand.  ~2wks ago punched a wall, XRays negative, re-injured 2d after initial event, since then worsening pain, swelling; has been using peroxide, otc antibiotic cream with no improvement.       .   Pain is rated at  9  /10 and is described as sharp.  Pain is constant.  Pain is made better by rest/immobilization, worse with motion.   Associated signs/symptoms:no fracture Previous treatment:    Past Medical History  Diagnosis Date  . Colitis, ulcerative ([place])   . Thyroid disease   . Hepatitis C   . Depression   . Stroke [facility])     Past Surgical History  Procedure Laterality Date  . Tonsillectomy    . Arm wound repair / closure    . Colonoscopy      History reviewed. No pertinent family history.  Social History:  reports that she has been smoking.  She does not have any smokeless tobacco history on file. She reports that she does not drink alcohol or use illicit drugs.  Allergies:  Allergies  Allergen Reactions  . Asa [Aspirin] Other (See Comments)    Ulcerative colitis flares.  . Nsaids Other (See Comments)    Ulcerative colitis flares  . Penicillins Rash    Has patient had a PCN reaction causing immediate rash, facial/tongue/throat swelling, SOB or lightheadedness with hypotension:unsure Has patient had a PCN reaction causing severe rash involving mucus membranes or skin necrosis:unsure Has patient had a PCN reaction that required hospitalization:No Has patient had a PCN reaction occurring within the last 10 years:Yes If all of the above answers are "NO", then may proceed with Cephalosporin use.     Medications: I have reviewed the patient's current medications.  Results for orders placed or performed during the hospital encounter of 11/11/15 (from the past 48 hour(s))  CBC with  Differential/Platelet     Status: Abnormal   Collection Time: 11/12/15 12:22 AM  Result Value Ref Range   WBC 8.3 4.0 - 10.5 K/uL   RBC 5.48 (H) 3.87 - 5.11 MIL/uL   Hemoglobin 14.9 12.0 - 15.0 g/dL   HCT 46.3 (H) 36.0 - 46.0 %   MCV 84.5 78.0 - 100.0 fL   MCH 27.2 26.0 - 34.0 pg   MCHC 32.2 30.0 - 36.0 g/dL   RDW 14.1 11.5 - 15.5 %   Platelets 349 150 - 400 K/uL   Neutrophils Relative % 50 %   Neutro Abs 4.2 1.7 - 7.7 K/uL   Lymphocytes Relative 39 %   Lymphs Abs 3.2 0.7 - 4.0 K/uL   Monocytes Relative 8 %   Monocytes Absolute 0.7 0.1 - 1.0 K/uL   Eosinophils Relative 2 %   Eosinophils Absolute 0.2 0.0 - 0.7 K/uL   Basophils Relative 1 %   Basophils Absolute 0.1 0.0 - 0.1 K/uL  Basic metabolic panel     Status: Abnormal   Collection Time: 11/12/15 12:22 AM  Result Value Ref Range   Sodium 140 135 - 145 mmol/L   Potassium 3.9 3.5 - 5.1 mmol/L   Chloride 103 101 - 111 mmol/L   CO2 26 22 - 32 mmol/L   Glucose, Bld 159 (H) 65 - 99 mg/dL   BUN 10 6 - 20 mg/dL   Creatinine, Ser 0.60 0.44 - 1.00 mg/dL  Calcium 9.6 8.9 - 10.3 mg/dL   GFR calc non Af Amer >60 >60 mL/min   GFR calc Af Amer >60 >60 mL/min    Comment: (NOTE) The eGFR has been calculated using the CKD EPI equation. This calculation has not been validated in all clinical situations. eGFR's persistently <60 mL/min signify possible Chronic Kidney Disease.    Anion gap 11 5 - 15    Dg Hand Complete Left  11/12/2015  CLINICAL DATA:  Progressive redness, swelling and pain about the left hand for a few days. Symptoms involve the ulnar side posteriorly. EXAM: LEFT HAND - COMPLETE 3+ VIEW COMPARISON:  Radiographs 11/01/2015 FINDINGS: Dorsal ulnar soft tissue edema in the region of the metacarpals, increased from prior. No radiopaque foreign body or tracking soft tissue air. No associated osseous abnormality. No fracture or dislocation. IMPRESSION: Progressive soft tissue edema about the dorsal ulnar hand. No acute osseous  abnormality. Electronically Signed   By: Jeb Levering M.D.   On: 11/12/2015 02:45    Pertinent items are noted in HPI. Temp:  [97.5 F (36.4 C)-97.8 F (36.6 C)] 97.5 F (36.4 C) (02/20 0629) Pulse Rate:  [84-93] 86 (02/20 0629) Resp:  [18-20] 18 (02/20 0629) BP: (156-190)/(80-96) 156/80 mmHg (02/20 0629) SpO2:  [93 %-98 %] 98 % (02/20 0629) General appearance: alert and cooperative Resp: clear to auscultation bilaterally Cardio: regular rate and rhythm GI: soft, non-tender; bowel sounds normal; no masses,  no organomegaly Extremities: L dorsal hand with ~1cm scab, with underlying fluctuance, surrounding erythema; R Hand wnl   Assessment: Abscess L hand Plan: Will I&D I have discussed this treatment plan in detail with patient , including the risks of the recommended treatment or surgery, the benefits and the alternatives.  The patient  understands that additional treatment may be necessary.  Ramzy Cappelletti CHRISTOPHER 11/12/2015, 7:35 AM
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
## 2015-11-12 NOTE — Discharge Instructions (Signed)

## 2015-11-12 NOTE — ED Provider Notes (Signed)
Care transferred to me. Patient with hand infection, Dr. Lenon Curt currently at bedside. Plan per hand surgery.  Dr. Lenon Curt has debrided. D/c home with antibiotics. Patient does not have insurance, given good Rx coupon for $16 clindamycin at [employer]. She tells me she can afford this. F/u with hand as outpatient.  Sherwood Gambler, MD 11/12/15 [phone]

## 2015-11-12 NOTE — ED Notes (Signed)
Bed: YP95 Expected date:  Expected time:  Means of arrival:  Comments: Aileen Fass

## 2015-11-12 NOTE — ED Notes (Signed)
MD at bedside. 

## 2015-11-16 ENCOUNTER — Telehealth (HOSPITAL_COMMUNITY): Payer: Self-pay

## 2015-11-16 NOTE — Telephone Encounter (Signed)
Pt calling for care instructions of hand stated doesn't have AVS.  Current writer was reviewing chart and place caller on hold when I picked up line was open but coud'nt get a response from caller.

## 2016-01-17 ENCOUNTER — Ambulatory Visit (INDEPENDENT_AMBULATORY_CARE_PROVIDER_SITE_OTHER): Payer: Self-pay

## 2016-01-17 ENCOUNTER — Ambulatory Visit (HOSPITAL_COMMUNITY)
Admission: EM | Admit: 2016-01-17 | Discharge: 2016-01-17 | Disposition: A | Discharge status: Home / Self Care | Payer: Self-pay | Attending: Emergency Medicine | Admitting: Emergency Medicine

## 2016-01-17 ENCOUNTER — Encounter (HOSPITAL_COMMUNITY): Payer: Self-pay | Admitting: Emergency Medicine

## 2016-01-17 DIAGNOSIS — J9809 Other diseases of bronchus, not elsewhere classified: Secondary | ICD-10-CM

## 2016-01-17 DIAGNOSIS — W1839XA Other fall on same level, initial encounter: Secondary | ICD-10-CM

## 2016-01-17 DIAGNOSIS — S60222A Contusion of left hand, initial encounter: Secondary | ICD-10-CM

## 2016-01-17 DIAGNOSIS — L03116 Cellulitis of left lower limb: Secondary | ICD-10-CM

## 2016-01-17 DIAGNOSIS — W010XXA Fall on same level from slipping, tripping and stumbling without subsequent striking against object, initial encounter: Secondary | ICD-10-CM

## 2016-01-17 MED ORDER — TRAMADOL-ACETAMINOPHEN 37.5-325 MG PO TABS: 1.0000 | ORAL_TABLET | Freq: Four times a day (QID) | ORAL | Status: DC | PRN

## 2016-01-17 MED ORDER — CEFTRIAXONE SODIUM 1 G IJ SOLR
1.0000 g | Freq: Once | INTRAMUSCULAR | Status: AC
  Administered 2016-01-17: 1 g via INTRAMUSCULAR

## 2016-01-17 MED ORDER — CEFTRIAXONE SODIUM 1 G IJ SOLR
INTRAMUSCULAR | Status: AC
  Filled 2016-01-17: qty 10

## 2016-01-17 MED ORDER — CLINDAMYCIN HCL 300 MG PO CAPS: 300.0000 mg | ORAL_CAPSULE | Freq: Three times a day (TID) | ORAL | Status: DC

## 2016-01-17 NOTE — Discharge Instructions (Signed)
Cellulitis Cellulitis is an infection of the skin and the tissue beneath it. The infected area is usually red and tender. Cellulitis occurs most often in the arms and lower legs.  CAUSES  Cellulitis is caused by bacteria that enter the skin through cracks or cuts in the skin. The most common types of bacteria that cause cellulitis are staphylococci and streptococci. SIGNS AND SYMPTOMS   Redness and warmth.  Swelling.  Tenderness or pain.  Fever. DIAGNOSIS  Your health care provider can usually determine what is wrong based on a physical exam. Blood tests may also be done. TREATMENT  Treatment usually involves taking an antibiotic medicine. HOME CARE INSTRUCTIONS   Take your antibiotic medicine as directed by your health care provider. Finish the antibiotic even if you start to feel better.  Keep the infected arm or leg elevated to reduce swelling.  Apply a warm cloth to the affected area up to 4 times per day to relieve pain.  Take medicines only as directed by your health care provider.  Keep all follow-up visits as directed by your health care provider. SEEK MEDICAL CARE IF:   You notice red streaks coming from the infected area.  Your red area gets larger or turns dark in color.  Your bone or joint underneath the infected area becomes painful after the skin has healed.  Your infection returns in the same area or another area.  You notice a swollen bump in the infected area.  You develop new symptoms.  You have a fever. SEEK IMMEDIATE MEDICAL CARE IF:   You feel very sleepy.  You develop vomiting or diarrhea.  You have a general ill feeling (malaise) with muscle aches and pains.   This information is not intended to replace advice given to you by your health care provider. Make sure you discuss any questions you have with your health care provider.   Document Released: 06/18/2005 Document Revised: 05/30/2015 Document Reviewed: 11/24/2011 Elsevier Interactive  Patient Education 2016 [place] A contusion is a deep bruise. Contusions are the result of a blunt injury to tissues and muscle fibers under the skin. The injury causes bleeding under the skin. The skin overlying the contusion may turn blue, purple, or yellow. Minor injuries will give you a painless contusion, but more severe contusions may stay painful and swollen for a few weeks.  CAUSES  This condition is usually caused by a blow, trauma, or direct force to an area of the body. SYMPTOMS  Symptoms of this condition include:  Swelling of the injured area.  Pain and tenderness in the injured area.  Discoloration. The area may have redness and then turn blue, purple, or yellow. DIAGNOSIS  This condition is diagnosed based on a physical exam and medical history. An X-ray, CT scan, or MRI may be needed to determine if there are any associated injuries, such as broken bones (fractures). TREATMENT  Specific treatment for this condition depends on what area of the body was injured. In general, the best treatment for a contusion is resting, icing, applying pressure to (compression), and elevating the injured area. This is often called the RICE strategy. Over-the-counter anti-inflammatory medicines may also be recommended for pain control.  HOME CARE INSTRUCTIONS   Rest the injured area.  If directed, apply ice to the injured area:  Put ice in a plastic bag.  Place a towel between your skin and the bag.  Leave the ice on for 20 minutes, 2-3 times per day.  If  directed, apply light compression to the injured area using an elastic bandage. Make sure the bandage is not wrapped too tightly. Remove and reapply the bandage as directed by your health care provider.  If possible, raise (elevate) the injured area above the level of your heart while you are sitting or lying down.  Take over-the-counter and prescription medicines only as told by your health care provider. SEEK  MEDICAL CARE IF:  Your symptoms do not improve after several days of treatment.  Your symptoms get worse.  You have difficulty moving the injured area. SEEK IMMEDIATE MEDICAL CARE IF:   You have severe pain.  You have numbness in a hand or foot.  Your hand or foot turns pale or cold.   This information is not intended to replace advice given to you by your health care provider. Make sure you discuss any questions you have with your health care provider.   Document Released: 06/18/2005 Document Revised: 05/30/2015 Document Reviewed: 01/24/2015 Elsevier Interactive Patient Education 2016 [place]  A hand contusion is a deep bruise to the hand. Contusions happen when an injury causes bleeding under the skin. Signs of bruising include pain, puffiness (swelling), and discolored skin. The contusion may turn blue, purple, or yellow. HOME CARE  Put ice on the injured area.  Put ice in a plastic bag.  Place a towel between your skin and the bag.  Leave the ice on for 15-20 minutes, 03-04 times a day.  Only take medicines as told by your doctor.  Use an elastic wrap only as told. You may remove the wrap for sleeping, showering, and bathing. Take the wrap off if you lose feeling (have numbness) in your fingers, or they turn blue or cold. Put the wrap on more loosely.  Keep the hand raised (elevated) with pillows.  Avoid using your hand too much if it is painful. GET HELP RIGHT AWAY IF:   You have more redness, puffiness, or pain in your hand.  Your puffiness or pain does not get better with medicine.  You lose feeling in your hand, or you cannot move your fingers.  Your hand turns cold or blue.  You have pain when you move your fingers.  Your hand feels warm.  Your contusion does not get better in 2 days. MAKE SURE YOU:   Understand these instructions.  Will watch this condition.  Will get help right away if you are not doing well or you get  worse.   This information is not intended to replace advice given to you by your health care provider. Make sure you discuss any questions you have with your health care provider.   Document Released: 02/25/2008 Document Revised: 09/29/2014 Document Reviewed: 03/01/2012 Elsevier Interactive Patient Education [employer].

## 2016-01-17 NOTE — ED Notes (Signed)
Patient observed for 20 minutes prior to discharge.

## 2016-01-17 NOTE — ED Notes (Signed)
The patient presented to the [facility] with a complaint of left foot pain that she attributes to a possible infection. The patient's foot was swollen, red and warm to the touch. Patient did have good PMS.

## 2016-01-17 NOTE — ED Provider Notes (Signed)
CSN: [id number]     Arrival date & time 01/17/16  1751 History   First MD Initiated Contact with Patient 01/17/16 2031     Chief Complaint  Patient presents with  . Foot Pain   (Consider location/radiation/quality/duration/timing/severity/associated sxs/prior Treatment) HPI Comments: 50 year old female states that she was intoxicated to the point where she passed out approximately 3 days ago. She states that she struck her left foot on some object when she fell. She is now complaining of pain to the left foot where there is marked erythema involving approximately  3/4  force of the foot from the toes or the ankle. To the dorsum of the foot there is a wound with superficial underlying fluctuance, puffiness and tenderness.  SHe is also complaining of a cough. She is a current daily smoker.  After the history was obtained from the patient she later told me that she injured her right hand. She is complaining of swelling pain and tenderness to the dorsum of the hand as well as the right index finger.   Past Medical History  Diagnosis Date  . Colitis, ulcerative ([place])   . Thyroid disease   . Hepatitis C   . Depression   . Stroke [facility])    Past Surgical History  Procedure Laterality Date  . Tonsillectomy    . Arm wound repair / closure    . Colonoscopy     History reviewed. No pertinent family history. Social History  Substance Use Topics  . Smoking status: Current Every Day Smoker  . Smokeless tobacco: None  . Alcohol Use: No   OB History    No data available     Review of Systems  Constitutional: Negative for fever.  HENT: Negative.   Respiratory: Positive for cough and shortness of breath.   Cardiovascular: Negative.   Gastrointestinal: Negative.   Genitourinary: Negative.   Musculoskeletal: Positive for gait problem. Negative for myalgias and back pain.       As per history of present illness  Skin: Positive for color change.  Neurological: Negative.   All other systems  reviewed and are negative.   Allergies  Asa; Nsaids; and Penicillins  Home Medications   Prior to Admission medications   Medication Sig Start Date End Date Taking? Authorizing Provider  clindamycin (CLEOCIN) 300 MG capsule Take 1 capsule (300 mg total) by mouth 3 (three) times daily. 01/17/16   Janne Napoleon, NP  diphenhydrAMINE (BENADRYL) 25 MG tablet Take 25 mg by mouth every 6 (six) hours as needed.    Historical Provider, MD  HYDROcodone-acetaminophen (NORCO) 5-325 MG tablet Take 1-2 tablets by mouth every 6 (six) hours as needed for severe pain. 11/12/15   Sherwood Gambler, MD  traMADol-acetaminophen (ULTRACET) 37.5-325 MG tablet Take 1 tablet by mouth every 6 (six) hours as needed. 01/17/16   Janne Napoleon, NP   Meds Ordered and Administered this Visit   Medications  cefTRIAXone (ROCEPHIN) injection 1 g (1 g Intramuscular Given 01/17/16 2054)    BP 187/88 mmHg  Pulse 93  Temp(Src) 98.9 F (37.2 C) (Oral)  Resp 16  SpO2 100% No data found.   Physical Exam  Constitutional: She is oriented to person, place, and time. She appears well-developed and well-nourished. No distress.  HENT:  Mouth/Throat: Oropharynx is clear and moist.  Eyes: EOM are normal.  Neck: Normal range of motion. Neck supple.  Cardiovascular: Normal rate, regular rhythm and normal heart sounds.   Pulmonary/Chest: Effort normal. No respiratory distress. She has wheezes.  Bilateral diffuse expiratory wheezes.  Musculoskeletal: She exhibits edema and tenderness.  Swelling and tenderness to the right hand and index finger. Diminished range of motion of the right index finger.Distal neurovascular and sensory is intact.  Left foot without deformity. Positive for mild swelling and deep erythema to the distal 75% of the foot. Apparent wound with purplish red discoloration to the dorsum of the foot. Positive for tenderness.  Lymphadenopathy:    She has no cervical adenopathy.  Neurological: She is alert and oriented to  person, place, and time. She exhibits normal muscle tone.  Skin: Skin is warm and dry.  Nursing note and vitals reviewed.   ED Course  Procedures (including critical care time)  Labs Review Labs Reviewed - No data to display  Imaging Review Dg Hand Complete Right  01/17/2016  CLINICAL DATA:  Status post fall 3 days ago with pain in the right second and third metacarpals. EXAM: RIGHT HAND - COMPLETE 3+ VIEW COMPARISON:  August 20, 2015 FINDINGS: There is no evidence of fracture or dislocation. There is no evidence of arthropathy or other focal bone abnormality. Soft tissues are unremarkable. IMPRESSION: No acute fracture or dislocation. Electronically Signed   By: Abelardo Diesel M.D.   On: 01/17/2016 21:11   Dg Foot Complete Left  01/17/2016  CLINICAL DATA:  Status post fall a few days ago injuring left foot with pain. EXAM: LEFT FOOT - COMPLETE 3+ VIEW COMPARISON:  None. FINDINGS: There is no evidence of fracture or dislocation. There is small 1 island in the distal aspect of the fifth metatarsal. Soft tissues are unremarkable. IMPRESSION: No acute fracture or dislocation. Electronically Signed   By: Abelardo Diesel M.D.   On: 01/17/2016 21:13     Visual Acuity Review  Right Eye Distance:   Left Eye Distance:   Bilateral Distance:    Right Eye Near:   Left Eye Near:    Bilateral Near:         MDM   1. Fall from slip, trip, or stumble, initial encounter   2. Cellulitis of foot without toes, left   3. Hand contusion, left, initial encounter   4. Recurrent bronchospasm    Meds ordered this encounter  Medications  . cefTRIAXone (ROCEPHIN) injection 1 g    Sig:   . clindamycin (CLEOCIN) 300 MG capsule    Sig: Take 1 capsule (300 mg total) by mouth 3 (three) times daily.    Dispense:  30 capsule    Refill:  0    Order Specific Question:  Supervising Provider    Answer:  Melony Overly [id number]  . traMADol-acetaminophen (ULTRACET) 37.5-325 MG tablet    Sig: Take 1 tablet by  mouth every 6 (six) hours as needed.    Dispense:  15 tablet    Refill:  0    Order Specific Question:  Supervising Provider    Answer:  Melony Overly [id number]   Meds ordered this encounter  Medications  . cefTRIAXone (ROCEPHIN) injection 1 g    Sig:   . clindamycin (CLEOCIN) 300 MG capsule    Sig: Take 1 capsule (300 mg total) by mouth 3 (three) times daily.    Dispense:  30 capsule    Refill:  0    Order Specific Question:  Supervising Provider    Answer:  Melony Overly [id number]  . traMADol-acetaminophen (ULTRACET) 37.5-325 MG tablet    Sig: Take 1 tablet by mouth every 6 (six) hours as needed.  Dispense:  15 tablet    Refill:  0    Order Specific Question:  Supervising Provider    Answer:  Melony Overly [[id number]]   ACE to right hand  Ice locally Follow up woth your doctor or here Monday for recheck of wound. If worse return or go to the ED. Use your albuterol inhaler for cough and wheeze, stop smoking.    Janne Napoleon, NP 01/17/16 2135

## 2016-01-17 NOTE — ED Notes (Signed)
Patient called in waiting room, no answer

## 2016-04-03 ENCOUNTER — Emergency Department (HOSPITAL_COMMUNITY)
Admission: EM | Admit: 2016-04-03 | Discharge: 2016-04-03 | Discharge status: Absent without leave | Payer: Self-pay | Source: Home / Self Care | Attending: Emergency Medicine | Admitting: Emergency Medicine

## 2016-04-03 ENCOUNTER — Emergency Department (HOSPITAL_COMMUNITY): Payer: Self-pay

## 2016-04-03 DIAGNOSIS — Y9389 Activity, other specified: Secondary | ICD-10-CM | POA: Insufficient documentation

## 2016-04-03 DIAGNOSIS — Z79899 Other long term (current) drug therapy: Secondary | ICD-10-CM | POA: Insufficient documentation

## 2016-04-03 DIAGNOSIS — S0083XA Contusion of other part of head, initial encounter: Secondary | ICD-10-CM

## 2016-04-03 DIAGNOSIS — W1839XA Other fall on same level, initial encounter: Secondary | ICD-10-CM | POA: Insufficient documentation

## 2016-04-03 DIAGNOSIS — F17200 Nicotine dependence, unspecified, uncomplicated: Secondary | ICD-10-CM | POA: Insufficient documentation

## 2016-04-03 DIAGNOSIS — T5491XA Toxic effect of unspecified corrosive substance, accidental (unintentional), initial encounter: Secondary | ICD-10-CM

## 2016-04-03 DIAGNOSIS — Y999 Unspecified external cause status: Secondary | ICD-10-CM | POA: Insufficient documentation

## 2016-04-03 DIAGNOSIS — S0093XA Contusion of unspecified part of head, initial encounter: Secondary | ICD-10-CM | POA: Insufficient documentation

## 2016-04-03 DIAGNOSIS — F329 Major depressive disorder, single episode, unspecified: Secondary | ICD-10-CM | POA: Insufficient documentation

## 2016-04-03 DIAGNOSIS — Z8673 Personal history of transient ischemic attack (TIA), and cerebral infarction without residual deficits: Secondary | ICD-10-CM | POA: Insufficient documentation

## 2016-04-03 DIAGNOSIS — Y929 Unspecified place or not applicable: Secondary | ICD-10-CM | POA: Insufficient documentation

## 2016-04-03 DIAGNOSIS — T405X1A Poisoning by cocaine, accidental (unintentional), initial encounter: Secondary | ICD-10-CM | POA: Insufficient documentation

## 2016-04-03 LAB — CBC
HCT: 45.3 % (ref 36.0–46.0)
Hemoglobin: 15.1 g/dL — ABNORMAL HIGH (ref 12.0–15.0)
MCH: 27.3 pg (ref 26.0–34.0)
MCHC: 33.3 g/dL (ref 30.0–36.0)
MCV: 81.9 fL (ref 78.0–100.0)
PLATELETS: 225 10*3/uL (ref 150–400)
RBC: 5.53 MIL/uL — AB (ref 3.87–5.11)
RDW: 14.3 % (ref 11.5–15.5)
WBC: 7.4 10*3/uL (ref 4.0–10.5)

## 2016-04-03 LAB — COMPREHENSIVE METABOLIC PANEL
ALK PHOS: 107 U/L (ref 38–126)
ALT: 24 U/L (ref 14–54)
AST: 23 U/L (ref 15–41)
Albumin: 3.8 g/dL (ref 3.5–5.0)
Anion gap: 7 (ref 5–15)
BILIRUBIN TOTAL: 0.5 mg/dL (ref 0.3–1.2)
BUN: 15 mg/dL (ref 6–20)
CALCIUM: 8.6 mg/dL — AB (ref 8.9–10.3)
CO2: 23 mmol/L (ref 22–32)
CREATININE: 0.66 mg/dL (ref 0.44–1.00)
Chloride: 107 mmol/L (ref 101–111)
GFR calc Af Amer: >60 mL/min (ref >60)
Glucose, Bld: 116 mg/dL — ABNORMAL HIGH (ref 65–99)
Potassium: 3.4 mmol/L — ABNORMAL LOW (ref 3.5–5.1)
Sodium: 137 mmol/L (ref 135–145)
TOTAL PROTEIN: 7.8 g/dL (ref 6.5–8.1)

## 2016-04-03 LAB — I-STAT BETA HCG BLOOD, ED (MC, WL, AP ONLY)
HCG, QUANTITATIVE: 6 m[IU]/mL — AB (ref <5)
I-stat hCG, quantitative: 5.3 m[IU]/mL — ABNORMAL HIGH (ref <5)

## 2016-04-03 LAB — RAPID URINE DRUG SCREEN, HOSP PERFORMED
AMPHETAMINES: NOT DETECTED
Barbiturates: NOT DETECTED
Benzodiazepines: NOT DETECTED
Cocaine: POSITIVE — AB
Opiates: NOT DETECTED
Tetrahydrocannabinol: POSITIVE — AB

## 2016-04-03 LAB — ACETAMINOPHEN LEVEL: Acetaminophen (Tylenol), Serum: <10 ug/mL — ABNORMAL LOW (ref 10–30)

## 2016-04-03 LAB — CBG MONITORING, ED: GLUCOSE-CAPILLARY: 120 mg/dL — AB (ref 65–99)

## 2016-04-03 LAB — ETHANOL

## 2016-04-03 LAB — SALICYLATE LEVEL: Salicylate Lvl: <4 mg/dL (ref 2.8–30.0)

## 2016-04-03 NOTE — ED Notes (Signed)
Bed: GE36 Expected date:  Expected time:  Means of arrival:  Comments: EMS 62F swallowed small bag of cocaine?

## 2016-04-03 NOTE — ED Notes (Addendum)
Per EMS, Pt brought in with [employer], picked up at [facility]. EMS was about to clear the pt. to be arrested and pt then stated that she swallowed a small bag of cocaine. Pt is a regular IV cocaine user. Pt appears diaphoretic and agitated. A&Ox4 and ambulatory. When pt found out she was going to jail pt threw herself in the floor and faked a seizure and hit her head/ R eyebrow on the floor. Multiple witnesses confirm it was not a real seizure. Pt was not post ictal. Pt had given [employer] a dead person's ID and credit cards. Pt was being arrested for stealing, drug issues, and outstanding warrants.

## 2016-04-03 NOTE — ED Provider Notes (Signed)
CSN: [id number]     Arrival date & time 04/03/16  2013 History  By signing my name below, I, [facility], attest that this documentation has been prepared under the direction and in the presence of Junius Creamer, NP. Electronically Signed: Virgel Bouquet, ED Scribe. 04/03/2016. 10:20 PM.    Chief Complaint  Patient presents with  . Ingestion     The history is provided by the patient. No language interpreter was used.   HPI Comments: Holly Weber is a 50 y.o. female with a hx of CP, HA, arthritis, and IV cocaine use who presents to the Emergency Department for evaluation after ingesting a small bag of cocaine shortly PTA. Pt states that she has been taking cocaine since yesterday, bought a new 3-gram bag earlier today, and was about to be arrested this evening when she swallowed this bag followed by seizure-like activity that caused her to fall and strike her head on the ground. She notes that the bag contained 3 grams of cocaine when she bought it but was unsure how much was left when she ingested the bag this evening. She reports associated back pain, HA, and a feverish feeling. She notes that she has not eaten recently but did drink beer earlier today. Per pt, she has had two seizures in the past although she is unable to recall her most recent episode. She takes prescription medications regularly for "pain and nerves" but states that she is unable to recall who prescribed these medications to her. Denies any other symptoms currently.  Past Medical History  Diagnosis Date  . Colitis, ulcerative ([place])   . Thyroid disease   . Hepatitis C   . Depression   . Stroke [facility])    Past Surgical History  Procedure Laterality Date  . Tonsillectomy    . Arm wound repair / closure    . Colonoscopy     No family history on file. Social History  Substance Use Topics  . Smoking status: Current Every Day Smoker  . Smokeless tobacco: Not on file  . Alcohol Use: No   OB History    No  data available     Review of Systems  Constitutional: Negative for fever.  HENT: Negative for congestion.   Eyes: Negative for visual disturbance.  Respiratory: Negative for shortness of breath.   Musculoskeletal: Positive for back pain. Negative for neck pain.  Skin: Positive for wound.  Neurological: Positive for headaches.  All other systems reviewed and are negative.     Allergies  Asa; Nsaids; and Penicillins  Home Medications   Prior to Admission medications   Medication Sig Start Date End Date Taking? Authorizing Provider  clindamycin (CLEOCIN) 300 MG capsule Take 1 capsule (300 mg total) by mouth 3 (three) times daily. Patient not taking: Reported on 04/04/2016 01/17/16   Janne Napoleon, NP  HYDROcodone-acetaminophen (NORCO) 5-325 MG tablet Take 1-2 tablets by mouth every 6 (six) hours as needed for severe pain. Patient not taking: Reported on 04/04/2016 11/12/15   Sherwood Gambler, MD  traMADol-acetaminophen (ULTRACET) 37.5-325 MG tablet Take 1 tablet by mouth every 6 (six) hours as needed. Patient not taking: Reported on 04/04/2016 01/17/16   Janne Napoleon, NP   BP 190/92 mmHg  Pulse 70  Temp(Src) 97.9 F (36.6 C) (Oral)  Resp 18  Ht 5' 6"  (1.676 m)  Wt 86.183 kg  BMI 30.68 kg/m2  SpO2 97% Physical Exam  Constitutional: She is oriented to person, place, and time. She appears well-developed and  well-nourished. No distress.  HENT:  Head: Normocephalic and atraumatic.  Eyes: Conjunctivae are normal.  Neck: Normal range of motion.  Cardiovascular: Normal rate.   Pulmonary/Chest: Effort normal. No respiratory distress.  Musculoskeletal: Normal range of motion.  Neurological: She is alert and oriented to person, place, and time.  Skin: Skin is warm and dry.     Psychiatric: Her speech is normal. Her affect is inappropriate. Cognition and memory are impaired. She expresses impulsivity and inappropriate judgment.  agitiated unable to hold still than falls asleep mid sentence  She is inattentive.  Nursing note and vitals reviewed.   ED Course  Procedures   DIAGNOSTIC STUDIES: Oxygen Saturation is 100% on RA, normal by my interpretation.    COORDINATION OF CARE: 8:43 PM Will monitor pt in ED. Will return to discuss results of labs and imaging. Discussed treatment plan with pt at bedside and pt agreed to plan.   Labs Review Labs Reviewed  COMPREHENSIVE METABOLIC PANEL - Abnormal; Notable for the following:    Potassium 3.4 (*)    Glucose, Bld 116 (*)    Calcium 8.6 (*)    All other components within normal limits  ACETAMINOPHEN LEVEL - Abnormal; Notable for the following:    Acetaminophen (Tylenol), Serum <10 (*)    All other components within normal limits  CBC - Abnormal; Notable for the following:    RBC 5.53 (*)    Hemoglobin 15.1 (*)    All other components within normal limits  URINE RAPID DRUG SCREEN, HOSP PERFORMED - Abnormal; Notable for the following:    Cocaine POSITIVE (*)    Tetrahydrocannabinol POSITIVE (*)    All other components within normal limits  CBG MONITORING, ED - Abnormal; Notable for the following:    Glucose-Capillary 120 (*)    All other components within normal limits  I-STAT BETA HCG BLOOD, ED ([facility], [facility], [facility] ONLY) - Abnormal; Notable for the following:    I-stat hCG, quantitative 6.0 (*)    All other components within normal limits  I-STAT BETA HCG BLOOD, ED ([facility], [facility], [facility] ONLY) - Abnormal; Notable for the following:    I-stat hCG, quantitative 5.3 (*)    All other components within normal limits  ETHANOL  SALICYLATE LEVEL    Imaging Review Ct Head Wo Contrast  04/04/2016  CLINICAL DATA:  Seizure-like activity after swallowing cocaine. EXAM: CT HEAD WITHOUT CONTRAST TECHNIQUE: Contiguous axial images were obtained from the base of the skull through the vertex without intravenous contrast. COMPARISON:  None. FINDINGS: Examination is somewhat limited due to motion artifact. Ventricles and sulci appear symmetrical. No  ventricular dilatation. No mass effect or midline shift. No abnormal extra-axial fluid collections. Gray-white matter junctions are distinct. Basal cisterns are not effaced. No evidence of acute intracranial hemorrhage. No depressed skull fractures. Visualized paranasal sinuses and mastoid air cells are not opacified. IMPRESSION: No acute intracranial abnormalities. Electronically Signed   By: Lucienne Capers M.D.   On: 04/04/2016 01:19   Ct Head Wo Contrast  04/03/2016  CLINICAL DATA:  Facial trauma.  Diaphoretic.  Alert and oriented. EXAM: CT HEAD WITHOUT CONTRAST TECHNIQUE: Contiguous axial images were obtained from the base of the skull through the vertex without intravenous contrast. COMPARISON:  None. FINDINGS: There is no evidence of mass effect, midline shift or extra-axial fluid collections. There is no evidence of a space-occupying lesion or intracranial hemorrhage. There is no evidence of a cortical-based area of acute infarction. The ventricles and sulci are appropriate for the  patient's age. The basal cisterns are patent. Visualized portions of the orbits are unremarkable. The visualized portions of the paranasal sinuses and mastoid air cells are unremarkable. The osseous structures are unremarkable. IMPRESSION: No acute intracranial pathology. Electronically Signed   By: Kathreen Devoid   On: 04/03/2016 22:48   I have personally reviewed and evaluated these images and lab results as part of my medical decision-making.   EKG Interpretation None    [employer] reports the "seizure" was not real as patient was shieldiong her head from the concrete as she hit her head on the ground and made sure people were watching, there was was post ictal phase as patient was back to normal behavior immediately after.  Patient states she swallowed the bag of cocaine to avoid a charge by the police. She states she has swallowed as much as 14 grams at a time in similar circumstances   MDM   Final diagnoses:   Ingestion of bleach, initial encounter  Facial contusion, initial encounter    I personally performed the services described in this documentation, which was scribed in my presence. The recorded information has been reviewed and is accurate.  Junius Creamer, NP 04/04/16 [place], MD 04/05/16 [phone]

## 2016-04-04 ENCOUNTER — Emergency Department (HOSPITAL_COMMUNITY): Payer: Self-pay

## 2016-04-04 ENCOUNTER — Emergency Department (HOSPITAL_COMMUNITY)
Admission: EM | Admit: 2016-04-04 | Discharge: 2016-04-04 | Disposition: A | Discharge status: Home / Self Care | Payer: Self-pay | Attending: Emergency Medicine | Admitting: Emergency Medicine

## 2016-04-04 ENCOUNTER — Encounter (HOSPITAL_COMMUNITY): Payer: Self-pay | Admitting: Emergency Medicine

## 2016-04-04 DIAGNOSIS — S0093XA Contusion of unspecified part of head, initial encounter: Secondary | ICD-10-CM

## 2016-04-04 DIAGNOSIS — T5791XA Toxic effect of unspecified inorganic substance, accidental (unintentional), initial encounter: Secondary | ICD-10-CM

## 2016-04-04 LAB — RAPID URINE DRUG SCREEN, HOSP PERFORMED
Amphetamines: NOT DETECTED
BARBITURATES: NOT DETECTED
Benzodiazepines: NOT DETECTED
COCAINE: POSITIVE — AB
Opiates: NOT DETECTED
Tetrahydrocannabinol: POSITIVE — AB

## 2016-04-04 LAB — COMPREHENSIVE METABOLIC PANEL
ALBUMIN: 3.7 g/dL (ref 3.5–5.0)
ALK PHOS: 97 U/L (ref 38–126)
ALT: 23 U/L (ref 14–54)
ANION GAP: 6 (ref 5–15)
AST: 25 U/L (ref 15–41)
BUN: 12 mg/dL (ref 6–20)
CALCIUM: 8.7 mg/dL — AB (ref 8.9–10.3)
CO2: 24 mmol/L (ref 22–32)
Chloride: 108 mmol/L (ref 101–111)
Creatinine, Ser: 0.54 mg/dL (ref 0.44–1.00)
GFR calc non Af Amer: >60 mL/min (ref >60)
GLUCOSE: 102 mg/dL — AB (ref 65–99)
Potassium: 3.9 mmol/L (ref 3.5–5.1)
SODIUM: 138 mmol/L (ref 135–145)
TOTAL PROTEIN: 7.7 g/dL (ref 6.5–8.1)
Total Bilirubin: 0.3 mg/dL (ref 0.3–1.2)

## 2016-04-04 LAB — CBC
HEMATOCRIT: 44.4 % (ref 36.0–46.0)
Hemoglobin: 14.7 g/dL (ref 12.0–15.0)
MCH: 26.6 pg (ref 26.0–34.0)
MCHC: 33.1 g/dL (ref 30.0–36.0)
MCV: 80.4 fL (ref 78.0–100.0)
Platelets: 262 10*3/uL (ref 150–400)
RBC: 5.52 MIL/uL — ABNORMAL HIGH (ref 3.87–5.11)
RDW: 14.5 % (ref 11.5–15.5)
WBC: 7.4 10*3/uL (ref 4.0–10.5)

## 2016-04-04 LAB — I-STAT BETA HCG BLOOD, ED (MC, WL, AP ONLY): HCG, QUANTITATIVE: 6.8 m[IU]/mL — AB (ref <5)

## 2016-04-04 LAB — ACETAMINOPHEN LEVEL

## 2016-04-04 LAB — ETHANOL: Alcohol, Ethyl (B): <5 mg/dL (ref <5)

## 2016-04-04 LAB — SALICYLATE LEVEL: Salicylate Lvl: <4 mg/dL (ref 2.8–30.0)

## 2016-04-04 NOTE — Discharge Instructions (Signed)
Facial or Scalp Contusion  A facial or scalp contusion is a deep bruise on the face or head. Contusions happen when an injury causes bleeding under the skin. Signs of bruising include pain, puffiness (swelling), and discolored skin. The contusion may turn blue, purple, or yellow. HOME CARE  Only take medicines as told by your doctor.  Put ice on the injured area.  Put ice in a plastic bag.  Place a towel between your skin and the bag.  Leave the ice on for 20 minutes, 2-3 times a day. GET HELP IF:  You have bite problems.  You have pain when chewing.  You are worried about your face not healing normally. GET HELP RIGHT AWAY IF:   You have severe pain or a headache and medicine does not help.  You are very tired or confused, or your personality changes.  You throw up (vomit).  You have a nosebleed that will not stop.  You see two of everything (double vision) or have blurry vision.  You have fluid coming from your nose or ear.  You have problems walking or using your arms or legs. MAKE SURE YOU:   Understand these instructions.  Will watch your condition.  Will get help right away if you are not doing well or get worse.   This information is not intended to replace advice given to you by your health care provider. Make sure you discuss any questions you have with your health care provider.   Document Released: 08/28/2011 Document Revised: 09/29/2014 Document Reviewed: 04/21/2013 Elsevier Interactive Patient Education [employer].

## 2016-04-04 NOTE — ED Provider Notes (Signed)
CSN: [id number]     Arrival date & time 04/03/16  2013 History  By signing my name below, I, [facility], attest that this documentation has been prepared under the direction and in the presence of Junius Creamer, NP. Electronically Signed: Virgel Bouquet, ED Scribe. 04/04/2016. 12:23 AM.   Chief Complaint  Patient presents with  . Ingestion    cocaine    The history is provided by the patient. No language interpreter was used.    HPI Comments: Holly Weber is a 50 y.o. female with a hx of colitis, hepatitis C, depression, and stroke who presents to the Emergency Department for evaluation after ingesting a small bag of cocaine shortly PTA. Pt states that she has been taking cocaine since yesterday, bought a new 3-gram bag earlier today, and was about to be arrested this evening when she swallowed this bag followed by seizure-like activity that caused her to fall and strike her head on the ground. She notes that the bag contained 3 grams of cocaine when she bought it but was unsure how much was left when she ingested the bag this evening. She reports associated back pain, HA, and a feverish feeling. She notes that she has not eaten recently but did drink beer earlier today. Per pt, she has had two seizures in the past although she is unable to recall her most recent episode. She takes prescription medications regularly for "pain and nerves" but states that she is unable to recall who prescribed these medications to her. Denies any other symptoms currently.  Past Medical History  Diagnosis Date  . Colitis, ulcerative ([place])   . Thyroid disease   . Hepatitis C   . Depression   . Stroke [facility])    Past Surgical History  Procedure Laterality Date  . Tonsillectomy    . Arm wound repair / closure    . Colonoscopy     History reviewed. No pertinent family history. Social History  Substance Use Topics  . Smoking status: Current Every Day Smoker  . Smokeless tobacco: None  . Alcohol  Use: No   OB History    No data available     Review of Systems  Constitutional: Negative for fever.  HENT: Negative for congestion.   Eyes: Negative for visual disturbance.  Respiratory: Negative for shortness of breath.   Musculoskeletal: Positive for back pain. Negative for neck pain.  Skin: Positive for wound.  Neurological: Positive for headaches.  All other systems reviewed and are negative.     Allergies  Asa; Nsaids; and Penicillins  Home Medications   Prior to Admission medications   Medication Sig Start Date End Date Taking? Authorizing Provider  clindamycin (CLEOCIN) 300 MG capsule Take 1 capsule (300 mg total) by mouth 3 (three) times daily. Patient not taking: Reported on 04/04/2016 01/17/16   Janne Napoleon, NP  HYDROcodone-acetaminophen (NORCO) 5-325 MG tablet Take 1-2 tablets by mouth every 6 (six) hours as needed for severe pain. Patient not taking: Reported on 04/04/2016 11/12/15   Sherwood Gambler, MD  traMADol-acetaminophen (ULTRACET) 37.5-325 MG tablet Take 1 tablet by mouth every 6 (six) hours as needed. Patient not taking: Reported on 04/04/2016 01/17/16   Janne Napoleon, NP   BP 133/89 mmHg  Pulse 65  Temp(Src) 97.9 F (36.6 C) (Oral)  Resp 19  Ht 5' 6"  (1.676 m)  Wt 86.183 kg  BMI 30.68 kg/m2  SpO2 98% Physical Exam  Constitutional: She is oriented to person, place, and time. She appears well-developed  and well-nourished. No distress.  HENT:  Head: Normocephalic and atraumatic.  Eyes: Conjunctivae are normal.  Neck: Normal range of motion.  Cardiovascular: Normal rate.   Pulmonary/Chest: Effort normal. No respiratory distress.  Musculoskeletal: Normal range of motion.  Neurological: She is alert and oriented to person, place, and time.  Skin: Skin is warm and dry.     Psychiatric: Her speech is normal. Her affect is inappropriate. Cognition and memory are impaired. She expresses impulsivity and inappropriate judgment. She exhibits abnormal recent  memory.  Agitated. Unable to hold still then falls asleep mid-sentence. She is inattentive.  Nursing note and vitals reviewed.   ED Course  Procedures   DIAGNOSTIC STUDIES: Oxygen Saturation is 100% on RA, normal by my interpretation.    COORDINATION OF CARE: 12:21 AM Monitored pt in ED for the past four hours. Spoke with medical staff who stated that the pt gave her sister's name during initial assesment. Will order labs and head CT. Discussed treatment plan with pt at bedside and pt agreed to plan.   Labs Review Labs Reviewed  COMPREHENSIVE METABOLIC PANEL - Abnormal; Notable for the following:    Glucose, Bld 102 (*)    Calcium 8.7 (*)    All other components within normal limits  ACETAMINOPHEN LEVEL - Abnormal; Notable for the following:    Acetaminophen (Tylenol), Serum <10 (*)    All other components within normal limits  CBC - Abnormal; Notable for the following:    RBC 5.52 (*)    All other components within normal limits  URINE RAPID DRUG SCREEN, HOSP PERFORMED - Abnormal; Notable for the following:    Cocaine POSITIVE (*)    Tetrahydrocannabinol POSITIVE (*)    All other components within normal limits  I-STAT BETA HCG BLOOD, ED ([facility], [facility], [facility] ONLY) - Abnormal; Notable for the following:    I-stat hCG, quantitative 6.8 (*)    All other components within normal limits  ETHANOL  SALICYLATE LEVEL    Imaging Review Ct Head Wo Contrast  04/04/2016  CLINICAL DATA:  Seizure-like activity after swallowing cocaine. EXAM: CT HEAD WITHOUT CONTRAST TECHNIQUE: Contiguous axial images were obtained from the base of the skull through the vertex without intravenous contrast. COMPARISON:  None. FINDINGS: Examination is somewhat limited due to motion artifact. Ventricles and sulci appear symmetrical. No ventricular dilatation. No mass effect or midline shift. No abnormal extra-axial fluid collections. Gray-white matter junctions are distinct. Basal cisterns are not effaced. No evidence  of acute intracranial hemorrhage. No depressed skull fractures. Visualized paranasal sinuses and mastoid air cells are not opacified. IMPRESSION: No acute intracranial abnormalities. Electronically Signed   By: Lucienne Capers M.D.   On: 04/04/2016 01:19   I have personally reviewed and evaluated these images and lab results as part of my medical decision-making.   EKG Interpretation None      [employer] reports the "seizure" was not real as patient was shielding her head from the concrete as she hit her head on the ground and made sure people were watching, there was was post ictal phase as patient was back to normal behavior immediately after.   Patient states she swallowed the bag of cocaine to avoid a charge by the police. She states she has swallowed as much as 14 grams at a time in similar circumstances  Patient has been observed for the past 8 hours.  She has been calm and cooperative.  Vital signs have normalized.  There's been no seizure activity.  Head CT  is normal. MDM   Final diagnoses:  Ingestion of substance, initial encounter  Head contusion, initial encounter        Junius Creamer, NP 04/04/16 [place], MD 04/04/16 1220

## 2016-04-04 NOTE — ED Notes (Addendum)
   Done 07/13/ 2017 2014 Per EMS, Pt brought in with [employer], picked up at [facility]. EMS was about to clear the pt. to be arrested and pt then stated that she swallowed a small bag of cocaine. Pt is a regular IV cocaine user. Pt appears diaphoretic and agitated. A&Ox4 and ambulatory. When pt found out she was going to jail pt threw herself in the floor and faked a seizure and hit her head/ R eyebrow on the floor. Multiple witnesses confirm it was not a real seizure. Pt was not post ictal. Pt had given [employer] a dead person's ID and credit cards. Pt was being arrested for stealing, drug issues, and outstanding warrants.

## 2016-04-04 NOTE — ED Notes (Signed)
Patient used sister ID. Patient really is another person. Working to credit patient for labs and CT.

## 2016-04-04 NOTE — Discharge Instructions (Signed)
Contusion A contusion is a deep bruise. Contusions happen when an injury causes bleeding under the skin. Symptoms of bruising include pain, swelling, and discolored skin. The skin may turn blue, purple, or yellow. HOME CARE   Rest the injured area.  If told, put ice on the injured area.  Put ice in a plastic bag.  Place a towel between your skin and the bag.  Leave the ice on for 20 minutes, 2-3 times per day.  If told, put light pressure (compression) on the injured area using an elastic bandage. Make sure the bandage is not too tight. Remove it and put it back on as told by your doctor.  If possible, raise (elevate) the injured area above the level of your heart while you are sitting or lying down.  Take over-the-counter and prescription medicines only as told by your doctor. GET HELP IF:  Your symptoms do not get better after several days of treatment.  Your symptoms get worse.  You have trouble moving the injured area. GET HELP RIGHT AWAY IF:   You have very bad pain.  You have a loss of feeling (numbness) in a hand or foot.  Your hand or foot turns pale or cold.   This information is not intended to replace advice given to you by your health care provider. Make sure you discuss any questions you have with your health care provider.   Document Released: 02/25/2008 Document Revised: 05/30/2015 Document Reviewed: 01/24/2015 Elsevier Interactive Patient Education [employer].

## 2016-04-04 NOTE — ED Notes (Signed)
Patient stated " The cocaine isnt going to kill me like I wanted it too. I wish I was dead."

## 2016-04-04 NOTE — ED Provider Notes (Signed)
Original note written under name provided by patient she was later than identified as Basilia Mcroberts and chart was copied into this chart   Junius Creamer, NP 04/04/16 0024  Daleen Bo, MD 04/04/16 [phone]

## 2016-12-13 ENCOUNTER — Emergency Department (HOSPITAL_COMMUNITY): Payer: Self-pay

## 2016-12-13 ENCOUNTER — Inpatient Hospital Stay (HOSPITAL_COMMUNITY)
Admission: EM | Admit: 2016-12-13 | Discharge: 2016-12-21 | DRG: 871 | Discharge status: Against Medical Advice | Payer: Self-pay | Attending: Nephrology | Admitting: Nephrology

## 2016-12-13 ENCOUNTER — Observation Stay (HOSPITAL_COMMUNITY): Payer: Self-pay

## 2016-12-13 ENCOUNTER — Encounter (HOSPITAL_COMMUNITY): Payer: Self-pay | Admitting: Emergency Medicine

## 2016-12-13 DIAGNOSIS — Z720 Tobacco use: Secondary | ICD-10-CM | POA: Diagnosis present

## 2016-12-13 DIAGNOSIS — D72829 Elevated white blood cell count, unspecified: Secondary | ICD-10-CM | POA: Insufficient documentation

## 2016-12-13 DIAGNOSIS — F1210 Cannabis abuse, uncomplicated: Secondary | ICD-10-CM | POA: Diagnosis present

## 2016-12-13 DIAGNOSIS — J189 Pneumonia, unspecified organism: Secondary | ICD-10-CM | POA: Diagnosis present

## 2016-12-13 DIAGNOSIS — Z765 Malingerer [conscious simulation]: Secondary | ICD-10-CM

## 2016-12-13 DIAGNOSIS — I159 Secondary hypertension, unspecified: Secondary | ICD-10-CM

## 2016-12-13 DIAGNOSIS — M5442 Lumbago with sciatica, left side: Secondary | ICD-10-CM | POA: Diagnosis present

## 2016-12-13 DIAGNOSIS — M461 Sacroiliitis, not elsewhere classified: Secondary | ICD-10-CM | POA: Diagnosis present

## 2016-12-13 DIAGNOSIS — E871 Hypo-osmolality and hyponatremia: Secondary | ICD-10-CM | POA: Diagnosis present

## 2016-12-13 DIAGNOSIS — A4101 Sepsis due to Methicillin susceptible Staphylococcus aureus: Principal | ICD-10-CM | POA: Diagnosis present

## 2016-12-13 DIAGNOSIS — Z880 Allergy status to penicillin: Secondary | ICD-10-CM

## 2016-12-13 DIAGNOSIS — F329 Major depressive disorder, single episode, unspecified: Secondary | ICD-10-CM | POA: Diagnosis present

## 2016-12-13 DIAGNOSIS — M609 Myositis, unspecified: Secondary | ICD-10-CM | POA: Diagnosis present

## 2016-12-13 DIAGNOSIS — F1990 Other psychoactive substance use, unspecified, uncomplicated: Secondary | ICD-10-CM | POA: Diagnosis present

## 2016-12-13 DIAGNOSIS — E876 Hypokalemia: Secondary | ICD-10-CM | POA: Diagnosis present

## 2016-12-13 DIAGNOSIS — I10 Essential (primary) hypertension: Secondary | ICD-10-CM | POA: Diagnosis present

## 2016-12-13 DIAGNOSIS — Z886 Allergy status to analgesic agent status: Secondary | ICD-10-CM

## 2016-12-13 DIAGNOSIS — M549 Dorsalgia, unspecified: Secondary | ICD-10-CM

## 2016-12-13 DIAGNOSIS — F1410 Cocaine abuse, uncomplicated: Secondary | ICD-10-CM | POA: Diagnosis present

## 2016-12-13 DIAGNOSIS — A419 Sepsis, unspecified organism: Secondary | ICD-10-CM

## 2016-12-13 DIAGNOSIS — R29898 Other symptoms and signs involving the musculoskeletal system: Secondary | ICD-10-CM

## 2016-12-13 DIAGNOSIS — F17210 Nicotine dependence, cigarettes, uncomplicated: Secondary | ICD-10-CM | POA: Diagnosis present

## 2016-12-13 DIAGNOSIS — M009 Pyogenic arthritis, unspecified: Secondary | ICD-10-CM | POA: Diagnosis present

## 2016-12-13 DIAGNOSIS — Z79899 Other long term (current) drug therapy: Secondary | ICD-10-CM

## 2016-12-13 DIAGNOSIS — D649 Anemia, unspecified: Secondary | ICD-10-CM | POA: Diagnosis present

## 2016-12-13 DIAGNOSIS — R7881 Bacteremia: Secondary | ICD-10-CM | POA: Diagnosis present

## 2016-12-13 DIAGNOSIS — M869 Osteomyelitis, unspecified: Secondary | ICD-10-CM | POA: Diagnosis present

## 2016-12-13 DIAGNOSIS — R739 Hyperglycemia, unspecified: Secondary | ICD-10-CM | POA: Diagnosis present

## 2016-12-13 DIAGNOSIS — Z8673 Personal history of transient ischemic attack (TIA), and cerebral infarction without residual deficits: Secondary | ICD-10-CM

## 2016-12-13 DIAGNOSIS — B182 Chronic viral hepatitis C: Secondary | ICD-10-CM | POA: Diagnosis present

## 2016-12-13 DIAGNOSIS — B9561 Methicillin susceptible Staphylococcus aureus infection as the cause of diseases classified elsewhere: Secondary | ICD-10-CM | POA: Diagnosis present

## 2016-12-13 HISTORY — DX: Essential (primary) hypertension: I10

## 2016-12-13 LAB — CBC WITH DIFFERENTIAL/PLATELET
BASOS ABS: 0 10*3/uL (ref 0.0–0.1)
Basophils Relative: 0 %
Eosinophils Absolute: 0.1 10*3/uL (ref 0.0–0.7)
Eosinophils Relative: 0 %
HEMATOCRIT: 38.8 % (ref 36.0–46.0)
Hemoglobin: 13 g/dL (ref 12.0–15.0)
LYMPHS PCT: 10 %
Lymphs Abs: 1.4 10*3/uL (ref 0.7–4.0)
MCH: 26.5 pg (ref 26.0–34.0)
MCHC: 33.5 g/dL (ref 30.0–36.0)
MCV: 79.2 fL (ref 78.0–100.0)
MONO ABS: 1.3 10*3/uL — AB (ref 0.1–1.0)
Monocytes Relative: 9 %
NEUTROS ABS: 11.4 10*3/uL — AB (ref 1.7–7.7)
Neutrophils Relative %: 81 %
Platelets: 167 10*3/uL (ref 150–400)
RBC: 4.9 MIL/uL (ref 3.87–5.11)
RDW: 13.6 % (ref 11.5–15.5)
WBC: 14.2 10*3/uL — ABNORMAL HIGH (ref 4.0–10.5)

## 2016-12-13 LAB — PROTIME-INR
INR: 1.07
PROTHROMBIN TIME: 13.9 s (ref 11.4–15.2)

## 2016-12-13 LAB — URINALYSIS, ROUTINE W REFLEX MICROSCOPIC
BILIRUBIN URINE: NEGATIVE
GLUCOSE, UA: NEGATIVE mg/dL
HGB URINE DIPSTICK: NEGATIVE
KETONES UR: 5 mg/dL — AB
Leukocytes, UA: NEGATIVE
Nitrite: NEGATIVE
PROTEIN: NEGATIVE mg/dL
Specific Gravity, Urine: 1.018 (ref 1.005–1.030)
pH: 7 (ref 5.0–8.0)

## 2016-12-13 LAB — BASIC METABOLIC PANEL
ANION GAP: 10 (ref 5–15)
BUN: 9 mg/dL (ref 6–20)
CALCIUM: 8.8 mg/dL — AB (ref 8.9–10.3)
CO2: 23 mmol/L (ref 22–32)
CREATININE: 0.52 mg/dL (ref 0.44–1.00)
Chloride: 101 mmol/L (ref 101–111)
GFR calc Af Amer: >60 mL/min (ref >60)
GFR calc non Af Amer: >60 mL/min (ref >60)
Glucose, Bld: 147 mg/dL — ABNORMAL HIGH (ref 65–99)
Potassium: 3.4 mmol/L — ABNORMAL LOW (ref 3.5–5.1)
Sodium: 134 mmol/L — ABNORMAL LOW (ref 135–145)

## 2016-12-13 LAB — CBC
HEMATOCRIT: 41.8 % (ref 36.0–46.0)
Hemoglobin: 14 g/dL (ref 12.0–15.0)
MCH: 26.8 pg (ref 26.0–34.0)
MCHC: 33.5 g/dL (ref 30.0–36.0)
MCV: 79.9 fL (ref 78.0–100.0)
Platelets: 164 10*3/uL (ref 150–400)
RBC: 5.23 MIL/uL — ABNORMAL HIGH (ref 3.87–5.11)
RDW: 13.7 % (ref 11.5–15.5)
WBC: 16.5 10*3/uL — ABNORMAL HIGH (ref 4.0–10.5)

## 2016-12-13 LAB — CREATININE, SERUM
Creatinine, Ser: 0.59 mg/dL (ref 0.44–1.00)
GFR calc Af Amer: >60 mL/min (ref >60)
GFR calc non Af Amer: >60 mL/min (ref >60)

## 2016-12-13 LAB — RAPID URINE DRUG SCREEN, HOSP PERFORMED
Amphetamines: NOT DETECTED
BARBITURATES: NOT DETECTED
BENZODIAZEPINES: NOT DETECTED
COCAINE: POSITIVE — AB
OPIATES: POSITIVE — AB
Tetrahydrocannabinol: POSITIVE — AB

## 2016-12-13 LAB — POC URINE PREG, ED: Preg Test, Ur: NEGATIVE

## 2016-12-13 LAB — TSH: TSH: 1.23 u[IU]/mL (ref 0.350–4.500)

## 2016-12-13 LAB — MAGNESIUM: MAGNESIUM: 1.6 mg/dL — AB (ref 1.7–2.4)

## 2016-12-13 MED ORDER — POTASSIUM CHLORIDE IN NACL 20-0.9 MEQ/L-% IV SOLN
INTRAVENOUS | Status: DC
  Administered 2016-12-13 – 2016-12-17 (×5): via INTRAVENOUS
  Filled 2016-12-13 (×9): qty 1000

## 2016-12-13 MED ORDER — MORPHINE SULFATE (PF) 4 MG/ML IV SOLN
4.0000 mg | Freq: Once | INTRAVENOUS | Status: AC
  Administered 2016-12-13: 4 mg via INTRAVENOUS
  Filled 2016-12-13: qty 1

## 2016-12-13 MED ORDER — ACETAMINOPHEN 325 MG PO TABS
650.0000 mg | ORAL_TABLET | Freq: Four times a day (QID) | ORAL | Status: DC | PRN
  Administered 2016-12-13 – 2016-12-17 (×3): 650 mg via ORAL
  Filled 2016-12-13 (×3): qty 2

## 2016-12-13 MED ORDER — VANCOMYCIN HCL IN DEXTROSE 1-5 GM/200ML-% IV SOLN
1000.0000 mg | Freq: Once | INTRAVENOUS | Status: AC
  Administered 2016-12-14: 1000 mg via INTRAVENOUS
  Filled 2016-12-13: qty 200

## 2016-12-13 MED ORDER — SODIUM CHLORIDE 0.9 % IV BOLUS (SEPSIS)
1000.0000 mL | Freq: Once | INTRAVENOUS | Status: AC
  Administered 2016-12-14: 1000 mL via INTRAVENOUS

## 2016-12-13 MED ORDER — DEXTROSE 5 % IV SOLN
2.0000 g | Freq: Three times a day (TID) | INTRAVENOUS | Status: DC
  Administered 2016-12-14 (×2): 2 g via INTRAVENOUS
  Filled 2016-12-13 (×3): qty 2

## 2016-12-13 MED ORDER — SODIUM CHLORIDE 0.9 % IV BOLUS (SEPSIS)
1000.0000 mL | Freq: Once | INTRAVENOUS | Status: AC
  Administered 2016-12-13: 1000 mL via INTRAVENOUS

## 2016-12-13 MED ORDER — LEVOFLOXACIN IN D5W 750 MG/150ML IV SOLN: 750.0000 mg | INTRAVENOUS | Status: DC

## 2016-12-13 MED ORDER — HYDROMORPHONE HCL 1 MG/ML IJ SOLN
1.0000 mg | Freq: Once | INTRAMUSCULAR | Status: AC
  Administered 2016-12-13: 1 mg via INTRAVENOUS
  Filled 2016-12-13: qty 1

## 2016-12-13 MED ORDER — CYCLOBENZAPRINE HCL 10 MG PO TABS
10.0000 mg | ORAL_TABLET | Freq: Once | ORAL | Status: AC
  Administered 2016-12-13: 10 mg via ORAL
  Filled 2016-12-13: qty 1

## 2016-12-13 MED ORDER — GADOBENATE DIMEGLUMINE 529 MG/ML IV SOLN
20.0000 mL | Freq: Once | INTRAVENOUS | Status: AC | PRN
  Administered 2016-12-13: 19 mL via INTRAVENOUS

## 2016-12-13 MED ORDER — ONDANSETRON HCL 4 MG/2ML IJ SOLN
4.0000 mg | Freq: Once | INTRAMUSCULAR | Status: AC
Start: 2016-12-13 — End: 2016-12-13
  Administered 2016-12-13: 4 mg via INTRAVENOUS
  Filled 2016-12-13: qty 2

## 2016-12-13 MED ORDER — VANCOMYCIN HCL IN DEXTROSE 750-5 MG/150ML-% IV SOLN
750.0000 mg | Freq: Three times a day (TID) | INTRAVENOUS | Status: DC
  Administered 2016-12-14 – 2016-12-15 (×4): 750 mg via INTRAVENOUS
  Filled 2016-12-13 (×6): qty 150

## 2016-12-13 MED ORDER — DOCUSATE SODIUM 100 MG PO CAPS
100.0000 mg | ORAL_CAPSULE | Freq: Two times a day (BID) | ORAL | Status: DC
  Administered 2016-12-13 – 2016-12-21 (×16): 100 mg via ORAL
  Filled 2016-12-13 (×16): qty 1

## 2016-12-13 MED ORDER — KETOROLAC TROMETHAMINE 30 MG/ML IJ SOLN
30.0000 mg | Freq: Four times a day (QID) | INTRAMUSCULAR | Status: DC | PRN
  Administered 2016-12-13 – 2016-12-18 (×11): 30 mg via INTRAVENOUS
  Filled 2016-12-13 (×11): qty 1

## 2016-12-13 MED ORDER — HYDROCODONE-ACETAMINOPHEN 5-325 MG PO TABS
1.0000 | ORAL_TABLET | ORAL | Status: DC | PRN
  Administered 2016-12-14 – 2016-12-16 (×7): 2 via ORAL
  Administered 2016-12-16: 1 via ORAL
  Administered 2016-12-16: 2 via ORAL
  Administered 2016-12-17: 1 via ORAL
  Administered 2016-12-17 – 2016-12-21 (×13): 2 via ORAL
  Filled 2016-12-13 (×3): qty 2
  Filled 2016-12-13: qty 1
  Filled 2016-12-13 (×19): qty 2
  Filled 2016-12-13: qty 1
  Filled 2016-12-13: qty 2

## 2016-12-13 MED ORDER — SODIUM CHLORIDE 0.9% FLUSH
3.0000 mL | Freq: Two times a day (BID) | INTRAVENOUS | Status: DC
  Administered 2016-12-13 – 2016-12-17 (×6): 3 mL via INTRAVENOUS

## 2016-12-13 MED ORDER — ENOXAPARIN SODIUM 40 MG/0.4ML ~~LOC~~ SOLN
40.0000 mg | SUBCUTANEOUS | Status: DC
  Administered 2016-12-13 – 2016-12-20 (×8): 40 mg via SUBCUTANEOUS
  Filled 2016-12-13 (×9): qty 0.4

## 2016-12-13 MED ORDER — INFLUENZA VAC SPLIT QUAD 0.5 ML IM SUSY
0.5000 mL | PREFILLED_SYRINGE | INTRAMUSCULAR | Status: AC
  Administered 2016-12-14: 0.5 mL via INTRAMUSCULAR
  Filled 2016-12-13: qty 0.5

## 2016-12-13 MED ORDER — NICOTINE 21 MG/24HR TD PT24
21.0000 mg | MEDICATED_PATCH | Freq: Every day | TRANSDERMAL | Status: DC
  Administered 2016-12-13 – 2016-12-21 (×9): 21 mg via TRANSDERMAL
  Filled 2016-12-13 (×10): qty 1

## 2016-12-13 MED ORDER — AZTREONAM 2 G IJ SOLR
2.0000 g | Freq: Once | INTRAMUSCULAR | Status: AC
  Administered 2016-12-14: 2 g via INTRAVENOUS
  Filled 2016-12-13: qty 2

## 2016-12-13 MED ORDER — ACETAMINOPHEN 650 MG RE SUPP: 650.0000 mg | Freq: Four times a day (QID) | RECTAL | Status: DC | PRN

## 2016-12-13 MED ORDER — HYDROCHLOROTHIAZIDE 25 MG PO TABS
25.0000 mg | ORAL_TABLET | Freq: Every day | ORAL | Status: DC
  Administered 2016-12-13 – 2016-12-17 (×5): 25 mg via ORAL
  Filled 2016-12-13 (×5): qty 1

## 2016-12-13 MED ORDER — METHOCARBAMOL 1000 MG/10ML IJ SOLN
500.0000 mg | Freq: Three times a day (TID) | INTRAVENOUS | Status: DC
  Administered 2016-12-13 – 2016-12-17 (×14): 500 mg via INTRAVENOUS
  Filled 2016-12-13 (×15): qty 5

## 2016-12-13 MED ORDER — MORPHINE SULFATE (PF) 4 MG/ML IV SOLN
4.0000 mg | INTRAVENOUS | Status: DC | PRN
  Administered 2016-12-13 – 2016-12-18 (×18): 4 mg via INTRAVENOUS
  Filled 2016-12-13 (×18): qty 1

## 2016-12-13 MED ORDER — SENNOSIDES-DOCUSATE SODIUM 8.6-50 MG PO TABS
1.0000 | ORAL_TABLET | Freq: Every evening | ORAL | Status: DC | PRN
  Administered 2016-12-21: 1 via ORAL
  Filled 2016-12-13 (×2): qty 1

## 2016-12-13 MED ORDER — ONDANSETRON HCL 4 MG PO TABS: 4.0000 mg | ORAL_TABLET | Freq: Four times a day (QID) | ORAL | Status: DC | PRN

## 2016-12-13 MED ORDER — BISACODYL 10 MG RE SUPP: 10.0000 mg | Freq: Every day | RECTAL | Status: DC | PRN

## 2016-12-13 MED ORDER — SODIUM CHLORIDE 0.9 % IV BOLUS (SEPSIS)
250.0000 mL | Freq: Once | INTRAVENOUS | Status: AC
  Administered 2016-12-14: 250 mL via INTRAVENOUS

## 2016-12-13 MED ORDER — LEVOFLOXACIN IN D5W 750 MG/150ML IV SOLN
750.0000 mg | Freq: Once | INTRAVENOUS | Status: AC
  Administered 2016-12-14: 750 mg via INTRAVENOUS
  Filled 2016-12-13: qty 150

## 2016-12-13 MED ORDER — MAGNESIUM SULFATE 2 GM/50ML IV SOLN
2.0000 g | Freq: Once | INTRAVENOUS | Status: AC
  Administered 2016-12-14: 2 g via INTRAVENOUS
  Filled 2016-12-13: qty 50

## 2016-12-13 MED ORDER — ONDANSETRON HCL 4 MG/2ML IJ SOLN
4.0000 mg | Freq: Four times a day (QID) | INTRAMUSCULAR | Status: DC | PRN
  Administered 2016-12-13: 4 mg via INTRAVENOUS
  Filled 2016-12-13: qty 2

## 2016-12-13 NOTE — Progress Notes (Signed)
Patient ID: Holly Weber, female   DOB: 21-Sep-1966, 51 y.o.   MRN: [id number] The patient had a fever of 101.2 F.  Chart was reviewed. Sepsis work up initiated.  Started on IV antibiotics and NS bolus at 30 mL/Kg.  Labs are still pending.  CLINICAL DATA:  Sepsis.  EXAM: PORTABLE CHEST 1 VIEW  COMPARISON:  07/20/2014  FINDINGS: The heart size is normal. There are patchy densities in the lung bases bilaterally suspicious for infiltrates. No pulmonary edema.  IMPRESSION: Suspect bilateral lower lobe infiltrates.  Tennis Must, MD [phone].

## 2016-12-13 NOTE — ED Notes (Signed)
Patient transported to X-ray 

## 2016-12-13 NOTE — ED Notes (Signed)
Admitting at bedside 

## 2016-12-13 NOTE — Progress Notes (Signed)
MD on call notified that patient has a temp greater than 101, and her HR is continuously tachy.  MD ordered blood cultures, lactic acid levels, chest xray, boluses, Magnesium, and several antibiotics.  Will administer medications, and closely monitor patient progress.

## 2016-12-13 NOTE — ED Notes (Signed)
Pt c/o of major pain in MRI. This RN took pain meds over to MRI.

## 2016-12-13 NOTE — Progress Notes (Signed)
Pharmacy Antibiotic Note  Holly Weber is a 51 y.o. female admitted on 12/13/2016 with sepsis.  Pharmacy has been consulted for Vancocin, Levaquin, and Azactam dosing.  Plan: Vancomycin 1024m IV x1 then 7563mIV every 8 hours.  Goal trough 15-20 mcg/mL.  Levaquin 75066mV every 24 hours. Azactam 2g IV every 8 hours.  Height: 5' 5"  (165.1 cm) Weight: 160 lb 1.6 oz (72.6 kg) IBW/kg (Calculated) : 57  Temp (24hrs), Avg:99.7 F (37.6 C), Min:98.8 F (37.1 C), Max:101.2 F (38.4 C)   Recent Labs Lab 12/13/16 0825 12/13/16 1718  WBC 14.2* 16.5*  CREATININE 0.52 0.59    Estimated Creatinine Clearance: 84.9 mL/min (by C-G formula based on SCr of 0.59 mg/dL).    Allergies  Allergen Reactions  . Asa [Aspirin] Other (See Comments)    Ulcerative colitis flares.  . Nsaids Other (See Comments)    Ulcerative colitis flares  . Penicillins Rash and Other (See Comments)    Has patient had a PCN reaction causing immediate rash, facial/tongue/throat swelling, SOB or lightheadedness with hypotension:unsure Has patient had a PCN reaction causing severe rash involving mucus membranes or skin necrosis:unsure Has patient had a PCN reaction that required hospitalization:No Has patient had a PCN reaction occurring within the last 10 years:Yes If all of the above answers are "NO", then may proceed with Cephalosporin use.      Thank you for allowing pharmacy to be a part of this patient's care.  VerWynona NeatharmD, BCPS  12/13/2016 10:37 PM

## 2016-12-13 NOTE — ED Notes (Signed)
Patient is stable and ready to be transport to the floor at this time.  Report was called to 5W RN.  Belongings taken with the patient to the floor.

## 2016-12-13 NOTE — ED Triage Notes (Signed)
Per EMS: Pt from home. Pt has middle to lower back pain radiating to left hip. Started yesterday and has progressively gotten worse. Pt unable to get out of bed this morning. Denies falling or any other injury.

## 2016-12-13 NOTE — Progress Notes (Signed)
When patient was admitted we found a plastic bag hidden behind her back with a white substance in it. Provider notified. Holly Weber is a 51 y.o. female patient admitted from ED awake, alert - oriented  X 4 - no acute distress noted.  VSS - Blood pressure (!) 163/91, pulse (!) 109, temperature 98.8 F (37.1 C), temperature source Oral, resp. rate (!) 26, height 5' 5"  (1.651 m), weight 72.6 kg (160 lb 1.6 oz), SpO2 94 %.    IV in place, occlusive dsg intact without redness.  Orientation to room, and floor completed with information packet given to patient/family.  Patient declined safety video at this time.  Admission INP armband ID verified with patient/family, and in place.   SR up x 2, fall assessment complete, with patient and family able to verbalize understanding of risk associated with falls, and verbalized understanding to call nsg before up out of bed.  Call light within reach, patient able to voice, and demonstrate understanding.  Skin, clean-dry- intact without evidence of bruising, or skin tears.        Will cont to eval and treat per MD orders.  [name], RN 12/13/2016 7:35 PM

## 2016-12-13 NOTE — ED Provider Notes (Signed)
[facility] DEPT Provider Note   CSN: [id number] Arrival date & time: 12/13/16  0109     History   Chief Complaint Chief Complaint  Patient presents with  . Back Pain    HPI Holly Weber is a 51 y.o. female with a past medical history significant for ulcerative colitis, hepatitis C, prior stroke, thyroid disease, depression, and reported chronic muscle spasms who presents with low back pain, left hip pain, and decrease ambulation. Patient reports that starting yesterday around noon, patient had gradual onset of low back pain. She describes as worsening to a 10 out of 10 severity. It goes from her left low back into her left hip and left leg. She describes the pain as sharp, worsened with any type of movement, and associated with inability to ambulate. She says that she was unable to walk or get to the bathroom status urinating on herself today. She said she did not lose control of her bladder but could not get to the bathroom in time. She denies numbness but feels weaker in the left leg. She denies abdominal pain, fevers, chills, chest pain, shortness of breath, constipation, diarrhea, or cough. She does report that she has had some bright blood in her bowel movements associated with her ulcerative colitis.  Patient denies any falls or traumatic injuries. She says this feels much worse than any low back pain she's had in the past. She says this feels different than prior muscle spasms.   The history is provided by the patient.  Back Pain   This is a new problem. The current episode started yesterday. The problem occurs constantly. The problem has been gradually worsening. The pain is associated with no known injury. The pain is present in the lumbar spine. The quality of the pain is described as shooting and stabbing. The pain radiates to the left thigh and left knee. The pain is at a severity of 10/10. The pain is severe. The symptoms are aggravated by bending, twisting and certain  positions. The pain is the same all the time. Associated symptoms include leg pain and weakness. Pertinent negatives include no chest pain, no fever, no numbness, no abdominal pain, no abdominal swelling, no bowel incontinence, no perianal numbness, no bladder incontinence (pt urinated on self when she could not ambulate to bathroom), no pelvic pain and no paresthesias. She has tried nothing for the symptoms.    Past Medical History:  Diagnosis Date  . Colitis, ulcerative ([place])   . Depression   . Hepatitis C   . Stroke ([place])   . Thyroid disease     There are no active problems to display for this patient.   Past Surgical History:  Procedure Laterality Date  . ARM WOUND REPAIR / CLOSURE    . COLONOSCOPY    . TONSILLECTOMY      OB History    No data available       Home Medications    Prior to Admission medications   Medication Sig Start Date End Date Taking? Authorizing Provider  clindamycin (CLEOCIN) 300 MG capsule Take 1 capsule (300 mg total) by mouth 3 (three) times daily. Patient not taking: Reported on 04/04/2016 01/17/16   Janne Napoleon, NP  HYDROcodone-acetaminophen (NORCO) 5-325 MG tablet Take 1-2 tablets by mouth every 6 (six) hours as needed for severe pain. Patient not taking: Reported on 04/04/2016 11/12/15   Sherwood Gambler, MD  traMADol-acetaminophen (ULTRACET) 37.5-325 MG tablet Take 1 tablet by mouth every 6 (six) hours as needed.  Patient not taking: Reported on 04/04/2016 01/17/16   Janne Napoleon, NP    Family History History reviewed. No pertinent family history.  Social History Social History  Substance Use Topics  . Smoking status: Current Every Day Smoker  . Smokeless tobacco: Not on file  . Alcohol use No     Allergies   Asa [aspirin]; Nsaids; and Penicillins   Review of Systems Review of Systems  Constitutional: Negative for chills, diaphoresis, fatigue and fever.  HENT: Negative for congestion.   Eyes: Negative for visual disturbance.    Respiratory: Negative for chest tightness, shortness of breath and wheezing.   Cardiovascular: Negative for chest pain and leg swelling.  Gastrointestinal: Positive for blood in stool (chronic reported from ulcerative colitis). Negative for abdominal pain, bowel incontinence, diarrhea, nausea and vomiting.  Genitourinary: Negative for bladder incontinence (pt urinated on self when she could not ambulate to bathroom), dyspareunia, flank pain, frequency and pelvic pain.  Musculoskeletal: Positive for back pain. Negative for neck pain and neck stiffness.  Skin: Negative for rash and wound.  Neurological: Positive for weakness. Negative for seizures, light-headedness, numbness and paresthesias.  Psychiatric/Behavioral: Negative for agitation.  All other systems reviewed and are negative.    Physical Exam Updated Vital Signs BP (!) 161/82 (BP Location: Left Arm)   Pulse 88   Temp 98.8 F (37.1 C) (Oral)   Resp 20   SpO2 99%   Physical Exam  Constitutional: She is oriented to person, place, and time. She appears well-developed and well-nourished. No distress.  HENT:  Head: Normocephalic and atraumatic.  Right Ear: External ear normal.  Left Ear: External ear normal.  Nose: Nose normal.  Mouth/Throat: Oropharynx is clear and moist. No oropharyngeal exudate.  Eyes: Conjunctivae and EOM are normal. Pupils are equal, round, and reactive to light.  Neck: Normal range of motion. Neck supple.  Cardiovascular: Normal rate, regular rhythm and intact distal pulses.   No murmur heard. Pulmonary/Chest: Effort normal. No stridor. No respiratory distress. She has no wheezes. She exhibits no tenderness.  Abdominal: Soft. She exhibits no distension. There is no tenderness. There is no rebound.  Musculoskeletal: She exhibits tenderness. She exhibits no edema or deformity.       Lumbar back: She exhibits tenderness.       Back:  Straight leg raise positive. No numbness. Possible poor effort but  decreased strength in left leg raise. Normal pulses.   Neurological: She is alert and oriented to person, place, and time. She has normal reflexes. She displays normal reflexes. No cranial nerve deficit or sensory deficit. She exhibits abnormal muscle tone. Coordination normal.  Skin: Skin is warm. Capillary refill takes less than 2 seconds. No rash noted. She is not diaphoretic. No erythema.  Psychiatric: She has a normal mood and affect.  Nursing note and vitals reviewed.    ED Treatments / Results  Labs (all labs ordered are listed, but only abnormal results are displayed) Labs Reviewed  CBC WITH DIFFERENTIAL/PLATELET - Abnormal; Notable for the following:       Result Value   WBC 14.2 (*)    Neutro Abs 11.4 (*)    Monocytes Absolute 1.3 (*)    All other components within normal limits  BASIC METABOLIC PANEL - Abnormal; Notable for the following:    Sodium 134 (*)    Potassium 3.4 (*)    Glucose, Bld 147 (*)    Calcium 8.8 (*)    All other components within normal limits  URINALYSIS, ROUTINE  W REFLEX MICROSCOPIC - Abnormal; Notable for the following:    APPearance HAZY (*)    Ketones, ur 5 (*)    All other components within normal limits  PROTIME-INR  RAPID URINE DRUG SCREEN, HOSP PERFORMED  POC URINE PREG, ED    EKG  EKG Interpretation None       Radiology Ct Lumbar Spine Wo Contrast  Result Date: 12/13/2016 CLINICAL DATA:  Lumbago with left-sided radicular symptoms EXAM: CT LUMBAR SPINE WITHOUT CONTRAST TECHNIQUE: Multidetector CT imaging of the lumbar spine was performed without intravenous contrast administration. Multiplanar CT image reconstructions were also generated. COMPARISON:  None. FINDINGS: Segmentation: There are 5 non-rib-bearing lumbar type vertebral bodies. 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 There is a well-defined S1-S2 interspace. Alignment: There is no spondylolisthesis. Vertebrae: No fracture.  No blastic or  lytic bone lesions. Paraspinal and other soft tissues: No paraspinous lesions are evident. There are foci of calcification in the abdominal aorta and common iliac artery without demonstrable abdominal aortic aneurysm. Disc levels: At T12-L1, there is no nerve root edema or effacement. No disc extrusion or stenosis. At L1-2, there is mild facet hypertrophy bilaterally. There is slight diffuse disc bulging. No nerve root edema or effacement. No disc extrusion or stenosis. At L2-3, there is mild facet hypertrophy bilaterally. There is a moderate diffuse disc bulging without nerve root edema or effacement. No disc extrusion or stenosis. At L3-4, there is moderate facet hypertrophy bilaterally. There is mild diffuse disc bulging without nerve root edema or effacement. No disc extrusion or stenosis. At L4-5, there is moderate facet hypertrophy bilaterally. There is broad-based disc bulging. No disc extrusion. There is, however, mild to moderate generalized spinal stenosis at L4-5 due to generalized disc bulging coupled with facet and ligamentum flava hypertrophy bilaterally. At L5-S1, there is moderate facet hypertrophy bilaterally. There is broad-based disc protrusion without nerve root edema or effacement. No frank disc extrusion or stenosis. At S1-2, there is moderate facet hypertrophy bilaterally. No nerve root edema or effacement. No disc extrusion or stenosis. There is osteoarthritic change in the assimilation joint on the right at S1-2. There is mild osteoarthritic change in the sacroiliac joints bilaterally. No sacroiliitis evident. 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 No disc extrusion on this study. Facet hypertrophy at multiple levels. No fracture or spondylolisthesis. 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 Aortoiliac atherosclerosis without demonstrable aneurysm. Electronically Signed   By: Lowella Grip III M.D.   On: 12/13/2016 09:26   Mr Lumbar Spine W Wo Contrast  Result Date: 12/13/2016 CLINICAL DATA:  Mid to low back pain radiating to LEFT hip. Progressive worsening. EXAM: MRI LUMBAR SPINE WITHOUT AND WITH CONTRAST TECHNIQUE: Multiplanar and multiecho pulse sequences of the lumbar spine were obtained without and with intravenous contrast. CONTRAST:  49m MULTIHANCE GADOBENATE DIMEGLUMINE 529 MG/ML IV SOLN COMPARISON:  CT lumbar earlier today. FINDINGS: Segmentation: Transitional anatomy. S1-S2 is well-developed due to partial assimilation of S1 into the sacrum. Alignment:  Anatomic Vertebrae: No worrisome osseous lesion. Congenitally short pedicles. No abnormal postcontrast enhancement. Conus medullaris: Extends to the L1 level and appears normal. Paraspinal and other soft tissues: Negative. Disc levels: L1-L2:  Unremarkable. L2-L3:  Annular bulge.  No impingement. L3-L4:  And bulge.  No impingement. L4-L5: Unremarkable  disc space. Posterior element hypertrophy. Short pedicles. Mild stenosis without definite impingement. L5-S1: Unremarkable disc space. Posterior element hypertrophy. Short pedicles. Mild stenosis without definite impingement. S1-S2 transitional interspace.  No impingement. IMPRESSION: No disc pathology of significance. 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 No definite subarticular zone or foraminal zone narrowing is established. Electronically Signed   By: Staci Righter M.D.   On: 12/13/2016 13:00   Dg Hip Unilat W Or Wo Pelvis 2-3 Views Left  Result Date: 12/13/2016 CLINICAL DATA:  Mid and lower back pain radiating to the left hip. EXAM: DG HIP (WITH OR WITHOUT PELVIS) 2-3V LEFT COMPARISON:  None. FINDINGS:  Pelvic bony ring is intact. No gross abnormality to the right hip. Left hip is located without a fracture. No significant joint space narrowing in either hip. Gas and stool in the pelvis. IMPRESSION: No acute abnormality in the pelvis or left hip. Electronically Signed   By: Markus Daft M.D.   On: 12/13/2016 09:12    Procedures Procedures (including critical care time)  Medications Ordered in ED Medications  morphine 4 MG/ML injection 4 mg (4 mg Intravenous Given 12/13/16 0808)  ondansetron (ZOFRAN) injection 4 mg (4 mg Intravenous Given 12/13/16 0808)  HYDROmorphone (DILAUDID) injection 1 mg (1 mg Intravenous Given 12/13/16 0921)  HYDROmorphone (DILAUDID) injection 1 mg (1 mg Intravenous Given 12/13/16 1012)  HYDROmorphone (DILAUDID) injection 1 mg (1 mg Intravenous Given 12/13/16 1142)  gadobenate dimeglumine (MULTIHANCE) injection 20 mL (19 mLs Intravenous Contrast Given 12/13/16 1220)  HYDROmorphone (DILAUDID) injection 1 mg (1 mg Intravenous Given 12/13/16 1509)  cyclobenzaprine (FLEXERIL) tablet 10 mg (10 mg Oral Given 12/13/16 1509)     Initial Impression / Assessment and Plan / ED Course  I have reviewed the triage vital signs and the nursing notes.  Pertinent labs & imaging results that were available during my care of the patient were reviewed by me and considered in my medical decision making (see chart for details).     Holly Weber is a 51 y.o. female with a past medical history significant for ulcerative colitis, hepatitis C, prior stroke, thyroid disease, depression, and reported chronic muscle spasms who presents with low back pain, left hip pain, and decrease ambulation.  History and exam are seen above.   On exam, patient is tenderness in her left low back and left SI area. Straight leg test positive with passive leg raise. Patient had no numbness appreciated but at decreased strength on the right with leg raise. Patient's lungs were clear, abdomen nontender. Physical exam  otherwise unremarkable.  Patient will have CT imaging to look for fracture. She was given pain medicine and have screening laboratory testing performed.  Given the weakness, anticipate patient may need MRI.   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 She said 3 weeks ago, she was injecting a cane. Laboratory testing shows a leukocytosis of 14.2. Urine showed no infection. Lumbar spine CT showed some mild to moderate spinal stenosis at L4/L5 with disc protrusion and some generalized facet and ligamentum flavum hypertrophy. No evidence of disc extrusion on the study. No fracture or spondylolisthesis seen. Some osteoarthritic changes seen at S1/S2.   Given the continued weakness on the left leg exam and the continued severe pain with the new revelation of IV drug use and leukocytosis, patient will have MRI with contrast to look for nerve abnormality versus epidural  abscess.  MRI showed no evidence of epidural abscess. No evidence of acute cord compression. No disc pathology of significance was seen.  After reassuring imaging, patient was given more pain medicine, oral muscle relaxant, and reassessed for improvement in her symptoms.   Patient was then walked to see if she could safely discharged. Unfortunately, patient screamed in pain and was unable to bear weight or even sit up in bed. Patient says that she lives at home upstairs. Given her uncontrolled pain despite IV pain medications, do not feel patient will be able to be discharged home safely given her decrease ambulation and severe pain.  Hospitalist team called and they will but the patient for pain management and physical therapy assessment. Patient admitted in stable condition.   Final Clinical Impressions(s) / ED Diagnoses   Final diagnoses:  Leg weakness  Acute left-sided low back pain with left-sided sciatica   Clinical Impression: 1. Acute left-sided low back pain with left-sided sciatica     2. Leg weakness   3. Back pain     Disposition: Admit to Hospitalist service    Courtney Paris, MD 12/13/16 [phone]

## 2016-12-13 NOTE — H&P (Signed)
[facility] History and Physical  Holly Weber JFH:[id number] DOB: 1965/12/15 DOA: 12/13/2016  Referring physician: ED MD PCP: No PCP Per Patient   Chief Complaint: acute lower back pain  HPI: Holly Weber is a 51 y.o. female w/  No pcp, with history of ulcers, colitis, hepatitis C, depression, history of stroke per prior records, cocaine abuse (last use, 4 wks ago IV per pt), tobacco abuse presents with progressive worsening lower back pain that is radiating down to the left leg numbness since Thursday around noon time.  Of note, she denies any urinary incontinence, but she had a hard time making it to the bathroom secondary to the severe pain and ended up urinating on herself.  Denies fevers or chills, cough, nausea, chest pain, shortness of breath, dyspnea on exertion.  History is limited given her sedated state. She tells me her pain is 7 out of 10. She is able to answer questions on verbal stimuli, but easily nods off to sleep after answer questions.   VS In ED on arrival: 169/84  t 98.8, rr 20, hr 88  Review of Systems:  Limited to sedation.  Past Medical History:  Diagnosis Date  . Colitis, ulcerative ([place])   . Depression   . Hepatitis C   . Stroke ([place])   . Thyroid disease    Past Surgical History:  Procedure Laterality Date  . ARM WOUND REPAIR / CLOSURE    . COLONOSCOPY    . TONSILLECTOMY     Social History:  reports that she has been smoking.  She does not have any smokeless tobacco history on file. She reports that she does not drink alcohol or use drugs.  Allergies  Allergen Reactions  . Asa [Aspirin] Other (See Comments)    Ulcerative colitis flares.  . Nsaids Other (See Comments)    Ulcerative colitis flares  . Penicillins Rash and Other (See Comments)    Has patient had a PCN reaction causing immediate rash, facial/tongue/throat swelling, SOB or lightheadedness with hypotension:unsure Has patient had a PCN reaction causing severe rash involving mucus  membranes or skin necrosis:unsure Has patient had a PCN reaction that required hospitalization:No Has patient had a PCN reaction occurring within the last 10 years:Yes If all of the above answers are "NO", then may proceed with Cephalosporin use.     History reviewed. No pertinent family history. reviewed   Prior to Admission medications   Medication Sig Start Date End Date Taking? Authorizing Provider  clindamycin (CLEOCIN) 300 MG capsule Take 1 capsule (300 mg total) by mouth 3 (three) times daily. Patient not taking: Reported on 04/04/2016 01/17/16   Janne Napoleon, NP  HYDROcodone-acetaminophen (NORCO) 5-325 MG tablet Take 1-2 tablets by mouth every 6 (six) hours as needed for severe pain. Patient not taking: Reported on 04/04/2016 11/12/15   Sherwood Gambler, MD  traMADol-acetaminophen (ULTRACET) 37.5-325 MG tablet Take 1 tablet by mouth every 6 (six) hours as needed. Patient not taking: Reported on 04/04/2016 01/17/16   Janne Napoleon, NP   Physical Exam: Vitals:   12/13/16 1430 12/13/16 1445 12/13/16 1500 12/13/16 1530  BP: (!) 176/83  (!) 183/84 (!) 152/87  Pulse: (!) 106 (!) 112 100 (!) 109  Resp:      Temp:      TempSrc:      SpO2: 93% 100% 100% 95%    Wt Readings from Last 3 Encounters:  04/03/16 86.2 kg (190 lb)  04/03/16 86.2 kg (190 lb)    General:  Appears calm, c/o of pain in lower back w/ radiation down to her left leg, sedated, able to answer questions w/ verbal stimuli, but than will quickly nod off to sleep after answering each question.  Older than stated age. Eyes: PERRL, normal lids, irises & conjunctiva ENT: grossly normal hearing, lips & tongue, mmm Neck: no LAD, no jvp. Cardiovascular: RRR, no m/r/g. No LE edema. Telemetry: SR, no arrhythmias  Respiratory: CTA bilaterally, no w/r/r. Normal respiratory effort. Abdomen: soft, ntnd, ntd Skin: no rash or induration seen on limited exam Musculoskeletal: grossly normal tone BUE/BLE, able to bend both of her knees while  in sitting up position on the bed w/o assitance, but rom more limited to left le.   Neg Homan's sign.  Pt had +straight leg test Left side initially per ED MD. Psychiatric: grossly normal mood and affect, speech fluent and appropriate Neurologic: grossly non-focal, but sedated from pain rx. Arousable to verbal stimuli          Labs on Admission:  Basic Metabolic Panel:  Recent Labs Lab 12/13/16 0825  NA 134*  K 3.4*  CL 101  CO2 23  GLUCOSE 147*  BUN 9  CREATININE 0.52  CALCIUM 8.8*   Liver Function Tests: No results for input(s): AST, ALT, ALKPHOS, BILITOT, PROT, ALBUMIN in the last 168 hours. No results for input(s): LIPASE, AMYLASE in the last 168 hours. No results for input(s): AMMONIA in the last 168 hours. CBC:  Recent Labs Lab 12/13/16 0825  WBC 14.2*  NEUTROABS 11.4*  HGB 13.0  HCT 38.8  MCV 79.2  PLT 167   Cardiac Enzymes: No results for input(s): CKTOTAL, CKMB, CKMBINDEX, TROPONINI in the last 168 hours.  BNP (last 3 results) No results for input(s): BNP in the last 8760 hours.  ProBNP (last 3 results) No results for input(s): PROBNP in the last 8760 hours.  CBG: No results for input(s): GLUCAP in the last 168 hours.  Radiological Exams on Admission: Ct Lumbar Spine Wo Contrast  Result Date: 12/13/2016 CLINICAL DATA:  Lumbago with left-sided radicular symptoms EXAM: CT LUMBAR SPINE WITHOUT CONTRAST TECHNIQUE: Multidetector CT imaging of the lumbar spine was performed without intravenous contrast administration. Multiplanar CT image reconstructions were also generated. COMPARISON:  None. FINDINGS: Segmentation: There are 5 non-rib-bearing lumbar type vertebral bodies. 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 There is a well-defined S1-S2 interspace. Alignment: There is no spondylolisthesis. Vertebrae: No fracture.  No blastic or lytic bone lesions. Paraspinal and other soft tissues: No paraspinous lesions are evident. There  are foci of calcification in the abdominal aorta and common iliac artery without demonstrable abdominal aortic aneurysm. Disc levels: At T12-L1, there is no nerve root edema or effacement. No disc extrusion or stenosis. At L1-2, there is mild facet hypertrophy bilaterally. There is slight diffuse disc bulging. No nerve root edema or effacement. No disc extrusion or stenosis. At L2-3, there is mild facet hypertrophy bilaterally. There is a moderate diffuse disc bulging without nerve root edema or effacement. No disc extrusion or stenosis. At L3-4, there is moderate facet hypertrophy bilaterally. There is mild diffuse disc bulging without nerve root edema or effacement. No disc extrusion or stenosis. At L4-5, there is moderate facet hypertrophy bilaterally. There is broad-based disc bulging. No disc extrusion. There is, however, mild to moderate generalized spinal stenosis at L4-5 due to generalized disc bulging coupled with facet and ligamentum flava hypertrophy bilaterally. At L5-S1, there is moderate facet hypertrophy bilaterally. There is broad-based disc  protrusion without nerve root edema or effacement. No frank disc extrusion or stenosis. At S1-2, there is moderate facet hypertrophy bilaterally. No nerve root edema or effacement. No disc extrusion or stenosis. There is osteoarthritic change in the assimilation joint on the right at S1-2. There is mild osteoarthritic change in the sacroiliac joints bilaterally. No sacroiliitis evident. 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 No disc extrusion on this study. Facet hypertrophy at multiple levels. No fracture or spondylolisthesis. 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 Aortoiliac atherosclerosis  without demonstrable aneurysm. Electronically Signed   By: Lowella Grip III M.D.   On: 12/13/2016 09:26   Mr Lumbar Spine W Wo Contrast  Result Date: 12/13/2016 CLINICAL DATA:  Mid to low back pain radiating to LEFT hip. Progressive worsening. EXAM: MRI LUMBAR SPINE WITHOUT AND WITH CONTRAST TECHNIQUE: Multiplanar and multiecho pulse sequences of the lumbar spine were obtained without and with intravenous contrast. CONTRAST:  28m MULTIHANCE GADOBENATE DIMEGLUMINE 529 MG/ML IV SOLN COMPARISON:  CT lumbar earlier today. FINDINGS: Segmentation: Transitional anatomy. S1-S2 is well-developed due to partial assimilation of S1 into the sacrum. Alignment:  Anatomic Vertebrae: No worrisome osseous lesion. Congenitally short pedicles. No abnormal postcontrast enhancement. Conus medullaris: Extends to the L1 level and appears normal. Paraspinal and other soft tissues: Negative. Disc levels: L1-L2:  Unremarkable. L2-L3:  Annular bulge.  No impingement. L3-L4:  And bulge.  No impingement. L4-L5: Unremarkable disc space. Posterior element hypertrophy. Short pedicles. Mild stenosis without definite impingement. L5-S1: Unremarkable disc space. Posterior element hypertrophy. Short pedicles. Mild stenosis without definite impingement. S1-S2 transitional interspace.  No impingement. IMPRESSION: No disc pathology of significance. 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 No definite subarticular zone or foraminal zone narrowing is established. Electronically Signed   By: JStaci RighterM.D.   On: 12/13/2016 13:00   Dg Hip Unilat W Or Wo Pelvis 2-3 Views Left  Result Date: 12/13/2016 CLINICAL DATA:  Mid and lower back pain radiating to the left hip. EXAM: DG HIP (WITH OR WITHOUT PELVIS) 2-3V LEFT COMPARISON:  None. FINDINGS: Pelvic bony ring is intact. No gross abnormality to the right hip. Left hip is located without a  fracture. No significant joint space narrowing in either hip. Gas and stool in the pelvis. IMPRESSION: No acute abnormality in the pelvis or left hip. Electronically Signed   By: AMarkus DaftM.D.   On: 12/13/2016 09:12   cxr //pending.  EKG: none  EKG Interpretation  Date/Time:    Ventricular Rate:    PR Interval:    QRS Duration:   QT Interval:    QTC Calculation:   R Axis:     Text Interpretation:          Assessment/Plan Principal Problem:   Acute low back pain with left-sided sciatica Active Problems:   Cocaine abuse   Tobacco abuse   HTN (hypertension)   Leukocytosis   1. Acute lower back pain w/ left sided sciatica - mri w/o disc pathology, likely MSK in nature, w/ persistent pain and unable to walk at this time. - obs med/surg - no signs of epi abscess on mri lumbar spine - trial tylenol/hydrocodone prn, toradol iv prn, and robaxin 500iv q8 scheduled.  Morphine  70m iv q4 hrs for severe - PT eval in am.  2. Cocaine iv use, last use 184monthgo per pt - uds ordered - tot cessation recd  3. Leukocytosis - unclear etiology, stress demargination from pain? - ua neg, ordered ucx  - ordered cxr as well  4. htn, uncontrolled, may be worse w/ pain - not on any meds - prior er visits w/ htn as well - started pt on hctz 25 qd, may need to change to prinzide 10-12.5 if still poorly uncontrolled - heart healthy diet - chk tsh/a1c  5. tob abuse  nicoderm patch 2165mday Total cessation recd  6. Pt is uninsured, no pcp - [facility] - have sw set up appt on dc w/ our clinic.  No c/s  Code Status: full (must indicate code status--if unknown or must be presumed, indicate so) DVT Prophylaxis: lovenox 40 sq qd Family Communication: no family (indicate person spoken with, if applicable, with phone number if by telephone) Disposition Plan: obs med surg (indicate anticipated LOS)  Time spent: 30m36m DawnMaren Reamer, MBA/MHA [facility] Pager [phone]

## 2016-12-14 ENCOUNTER — Encounter (HOSPITAL_COMMUNITY): Payer: Self-pay | Admitting: *Deleted

## 2016-12-14 DIAGNOSIS — A419 Sepsis, unspecified organism: Secondary | ICD-10-CM

## 2016-12-14 LAB — CBC WITH DIFFERENTIAL/PLATELET
BASOS ABS: 0 10*3/uL (ref 0.0–0.1)
Basophils Relative: 0 %
EOS ABS: 0 10*3/uL (ref 0.0–0.7)
Eosinophils Relative: 0 %
HCT: 35.7 % — ABNORMAL LOW (ref 36.0–46.0)
HEMOGLOBIN: 11.7 g/dL — AB (ref 12.0–15.0)
LYMPHS ABS: 1.4 10*3/uL (ref 0.7–4.0)
LYMPHS PCT: 11 %
MCH: 26.2 pg (ref 26.0–34.0)
MCHC: 32.8 g/dL (ref 30.0–36.0)
MCV: 79.9 fL (ref 78.0–100.0)
Monocytes Absolute: 1 10*3/uL (ref 0.1–1.0)
Monocytes Relative: 8 %
NEUTROS ABS: 10 10*3/uL — AB (ref 1.7–7.7)
Neutrophils Relative %: 81 %
Platelets: 154 10*3/uL (ref 150–400)
RBC: 4.47 MIL/uL (ref 3.87–5.11)
RDW: 13.7 % (ref 11.5–15.5)
WBC: 12.4 10*3/uL — AB (ref 4.0–10.5)

## 2016-12-14 LAB — COMPREHENSIVE METABOLIC PANEL
ALBUMIN: 3 g/dL — AB (ref 3.5–5.0)
ALT: 16 U/L (ref 14–54)
ANION GAP: 13 (ref 5–15)
AST: 19 U/L (ref 15–41)
Alkaline Phosphatase: 110 U/L (ref 38–126)
BILIRUBIN TOTAL: 1.6 mg/dL — AB (ref 0.3–1.2)
BUN: 7 mg/dL (ref 6–20)
CALCIUM: 8.8 mg/dL — AB (ref 8.9–10.3)
CHLORIDE: 93 mmol/L — AB (ref 101–111)
CO2: 24 mmol/L (ref 22–32)
Creatinine, Ser: 0.75 mg/dL (ref 0.44–1.00)
GFR calc Af Amer: >60 mL/min (ref >60)
GFR calc non Af Amer: >60 mL/min (ref >60)
GLUCOSE: 182 mg/dL — AB (ref 65–99)
Potassium: 3.3 mmol/L — ABNORMAL LOW (ref 3.5–5.1)
SODIUM: 130 mmol/L — AB (ref 135–145)
TOTAL PROTEIN: 7.2 g/dL (ref 6.5–8.1)

## 2016-12-14 LAB — HIV ANTIBODY (ROUTINE TESTING W REFLEX): HIV SCREEN 4TH GENERATION: NONREACTIVE

## 2016-12-14 LAB — PROTIME-INR
INR: 1.14
Prothrombin Time: 14.6 seconds (ref 11.4–15.2)

## 2016-12-14 LAB — PROCALCITONIN: PROCALCITONIN: 0.15 ng/mL

## 2016-12-14 LAB — URINE CULTURE: Culture: <10000 — AB

## 2016-12-14 LAB — LACTIC ACID, PLASMA
LACTIC ACID, VENOUS: 1.5 mmol/L (ref 0.5–1.9)
LACTIC ACID, VENOUS: 0.7 mmol/L (ref 0.5–1.9)

## 2016-12-14 LAB — APTT: aPTT: 41 seconds — ABNORMAL HIGH (ref 24–36)

## 2016-12-14 MED ORDER — WHITE PETROLATUM GEL
Status: AC
  Administered 2016-12-14: 13:00:00
  Filled 2016-12-14: qty 1

## 2016-12-14 NOTE — Progress Notes (Addendum)
Patient appearance has dramatically improved since implementation of medications and boluses.  BP now in 150s versus previous 170s; HR and temperature is WNL.  Patient pain level is much more manageable since fever broke.  Will continue to monitor patient.

## 2016-12-14 NOTE — Progress Notes (Signed)
    ID. 51yo F hx of chronic hep C, depression, cocaine abuse, admitted for progressive LBP. Found to be febrile up to 101F on admit with WBC of 12.4. Infectious work up shows blood cx + for MSSA. Imaging of spine thus far negative for discitis. She was empirically started on vancomycin and aztreonam since there is listing of penicillin allergy.  Recommend: - narrow to vancomycin alone - will review abtx allergy and may consider to challenge with cefazolin tomorrow - patient will need repeat blood cx tomorrow - she will need TEE - Dr Megan Salon to see tomorrow for further recs     [place] [facility]imicrobial Management Team Staphylococcus aureus bacteremia   Staphylococcus aureus bacteremia (SAB) is associated with a high rate of complications and mortality.  Specific aspects of clinical management are critical to optimizing the outcome of patients with SAB.  Therefore, the [facility] Antimicrobial Management Team [facility]) has initiated an intervention aimed at improving the management of SAB at [facility].  To do so, Infectious Diseases physicians are providing an evidence-based consult for the management of all patients with SAB.     Yes No Comments  Perform follow-up blood cultures (even if the patient is afebrile) to ensure clearance of bacteremia []  []  Please repeat on monday  Remove vascular catheter and obtain follow-up blood cultures after the removal of the catheter []  []  No picc line until we can establish bacteremia clearance  Perform echocardiography to evaluate for endocarditis (transthoracic ECHO is 40-50% sensitive, TEE is > 90% sensitive) []  []  Please keep in mind, that neither test can definitively EXCLUDE endocarditis, and that should clinical suspicion remain high for endocarditis the patient should then still be treated with an "endocarditis" duration of therapy = 6 weeks       Ensure source control []  []  Have all abscesses been drained effectively? Have deep seeded infections  (septic joints or osteomyelitis) had appropriate surgical debridement?  Investigate for "metastatic" sites of infection []  []  Does the patient have ANY symptom or physical exam finding that would suggest a deeper infection (back or neck pain that may be suggestive of vertebral osteomyelitis or epidural abscess, muscle pain that could be a symptom of pyomyositis)?  Keep in mind that for deep seeded infections MRI imaging with contrast is preferred rather than other often insensitive tests such as plain x-rays, especially early in a patient's presentation.  Change antibiotic therapy to __vancomycin for now____ []  []  Beta-lactam antibiotics are preferred for MSSA due to higher cure rates.   If on Vancomycin, goal trough should be 15 - 20 mcg/mL  Estimated duration of IV antibiotic therapy:  4-6 []  []  Consult case management for probably prolonged outpatient IV antibiotic therapy

## 2016-12-14 NOTE — Progress Notes (Signed)
Walked in patient room to stop IV from beeping and patient was moaning, screaming that she was in extreme pain in her back and that she had to "pee for 25 min and no one has come to help her." I asked her how she usually uses the bathroom (get up to go, bed pain, BSC?) she said female urinal. I got the urinal and let her urinate while she was still moaning that she was in pain. She received Morphine 43m at 8:34 and was due for Toradol. I went to get the Toradol and administered it at 11:00 then ask patient if she wanted to try an ice pack and she said she would try but didn't know if it would help. Got an ice pack and proceeded to try to place it in the area of pain. I ask patient to roll over so I could put it on her back and she said she couldn't. I told her I would help her by rolling with bed pad when I did she screamed and said I was being mean to her and that it was my fault that she had to wait 25 min to pee and that I was trying to hurt her. I explained to her that I just happen to come in to cut her IV off and that I did not even know that she needed to used the bathroom and all I was trying to do was help her. Left room so patient would have time to calm down and explained situation to charge nurse. Lonnie. Patient has called out again now wanting a muscle relaxer.  Robaxin not due until 14:00. Only other option is Hydrocodone. Not sure if all of this is drug-seeking behavior.

## 2016-12-14 NOTE — Progress Notes (Signed)
PHARMACY - PHYSICIAN COMMUNICATION CRITICAL VALUE ALERT - BLOOD CULTURE IDENTIFICATION (BCID)  Results for orders placed or performed during the hospital encounter of 12/13/16  Blood Culture ID Panel (Reflexed) (Collected: 12/13/2016 11:22 PM)  Result Value Ref Range   Enterococcus species NOT DETECTED NOT DETECTED   Listeria monocytogenes NOT DETECTED NOT DETECTED   Staphylococcus species DETECTED (A) NOT DETECTED   Staphylococcus aureus DETECTED (A) NOT DETECTED   Methicillin resistance NOT DETECTED NOT DETECTED   Streptococcus species NOT DETECTED NOT DETECTED   Streptococcus agalactiae NOT DETECTED NOT DETECTED   Streptococcus pneumoniae NOT DETECTED NOT DETECTED   Streptococcus pyogenes NOT DETECTED NOT DETECTED   Acinetobacter baumannii NOT DETECTED NOT DETECTED   Enterobacteriaceae species NOT DETECTED NOT DETECTED   Enterobacter cloacae complex NOT DETECTED NOT DETECTED   Escherichia coli NOT DETECTED NOT DETECTED   Klebsiella oxytoca NOT DETECTED NOT DETECTED   Klebsiella pneumoniae NOT DETECTED NOT DETECTED   Proteus species NOT DETECTED NOT DETECTED   Serratia marcescens NOT DETECTED NOT DETECTED   Haemophilus influenzae NOT DETECTED NOT DETECTED   Neisseria meningitidis NOT DETECTED NOT DETECTED   Pseudomonas aeruginosa NOT DETECTED NOT DETECTED   Candida albicans NOT DETECTED NOT DETECTED   Candida glabrata NOT DETECTED NOT DETECTED   Candida krusei NOT DETECTED NOT DETECTED   Candida parapsilosis NOT DETECTED NOT DETECTED   Candida tropicalis NOT DETECTED NOT DETECTED    Name of physician (or Provider) Contacted: Dr Clementeen Graham  Changes to prescribed antibiotics required: Continue abx  Wynell Balloon 12/14/2016  5:16 PM

## 2016-12-14 NOTE — Progress Notes (Addendum)
PROGRESS NOTE                                                                                                                                                                                                             Patient Demographics:    Holly Weber, is a 51 y.o. female, DOB - May 02, 1966, MVV:[id number]  Admit date - 12/13/2016   Admitting Physician Maren Reamer, MD  Outpatient Primary MD for the patient is No PCP Per Patient  LOS - 0  Outpatient Specialists:none  Chief Complaint  Patient presents with  . Back Pain       Brief Narrative   51 year old female with ulcerative colitis, hep C, depression, history of stroke, cocaine abuse (last use was about 4 weeks ago. IV), ongoing tobacco use presented with progressive worsening lower back pain radiating down to the left leg with some numbness since 2 days prior to admission. Denied any bowel or urinary incontinence. Patient reports having significant difficulty to move around or amblyopia. Denies fevers, chills, vomiting, chest pain, palpitations, loss of consciousness, shortness of breath or abdominal pain. In the ED she was septic with fever of 101.74F, tachycardic, tachypneic and WBC of 16.5 K. CT and MRI of the lumbar spine was unremarkable for stenosis, discitis or abscess. Chest x-ray concerning for bilateral infiltrate. Admitted to hospitalist service for sepsis.   Subjective:   Still c/o severe back pain radiating to left back .    Assessment  & Plan :   Principle problem: Sepsis ([place]) Etiology not clear. Chest x-ray does show bilateral infiltrate which might be the source. On empiric IV vancomycin and Aztreonam.  Follow blood cultures.  Acute low back pain with left-sided sciatica. CT and MRI of the lumbar spine without disc involvement, epidural abscess, spinal stenosis or nerve impingement. Suspected this is musculoskeletal. Pain control with  hydrocodone and Toradol when necessary. Continue scheduled Robaxin and IV morphine when necessary for severe pain. PT evaluation.  Polysubstance use Reports frequent cocaine use (last use was 1 month ago). Reduction positive for cocaine and THC.  Hypokalemia/hypomagnesemia Replenish  Hyponatremia Continue with IV fluids.  Uncontrolled hypertension Not on medication previously. Possibly triggered by pain. Started on HCTZ.  Tobacco abuse  counseled on cessation. Nicotine patch.   Code Status : full code  Family Communication  : none at bedside  Disposition Plan  : home once better  Barriers For Discharge : active symptoms  Consults  : none  Procedures  :  CT LS spine MRI LS spine  DVT Prophylaxis  :  Lovenox   Lab Results  Component Value Date   PLT 154 12/14/2016    Antibiotics  :   Anti-infectives    Start     Dose/Rate Route Frequency Ordered Stop   12/15/16 0000  levofloxacin (LEVAQUIN) IVPB 750 mg     750 mg 100 mL/hr over 90 Minutes Intravenous Every 24 hours 12/13/16 2243     12/14/16 0800  vancomycin (VANCOCIN) IVPB 750 mg/150 ml premix     750 mg 150 mL/hr over 60 Minutes Intravenous Every 8 hours 12/13/16 2243     12/14/16 0600  aztreonam (AZACTAM) 2 g in dextrose 5 % 50 mL IVPB     2 g 100 mL/hr over 30 Minutes Intravenous Every 8 hours 12/13/16 2243     12/13/16 2245  levofloxacin (LEVAQUIN) IVPB 750 mg     750 mg 100 mL/hr over 90 Minutes Intravenous  Once 12/13/16 2232 12/14/16 0138   12/13/16 2245  aztreonam (AZACTAM) 2 g in dextrose 5 % 50 mL IVPB     2 g 100 mL/hr over 30 Minutes Intravenous  Once 12/13/16 2232 12/14/16 0104   12/13/16 2245  vancomycin (VANCOCIN) IVPB 1000 mg/200 mL premix     1,000 mg 200 mL/hr over 60 Minutes Intravenous  Once 12/13/16 2232 12/14/16 0133        Objective:   Vitals:   12/14/16 0035 12/14/16 0104 12/14/16 0244 12/14/16 0443  BP:  120/64 (!) 156/67 137/71  Pulse:  92 90 83  Resp:  18 18 18   Temp:   98.4 F (36.9 C) 98.2 F (36.8 C) 98.1 F (36.7 C)  TempSrc:  Oral Oral Oral  SpO2: 96% 98% 97% 97%  Weight:      Height:        Wt Readings from Last 3 Encounters:  12/13/16 72.6 kg (160 lb 1.6 oz)  04/03/16 86.2 kg (190 lb)  04/03/16 86.2 kg (190 lb)     Intake/Output Summary (Last 24 hours) at 12/14/16 1243 Last data filed at 12/14/16 1139  Gross per 24 hour  Intake             1175 ml  Output             2725 ml  Net            -1550 ml     Physical Exam  Gen: In distress with pain HEENT: no pallor, moist mucosa, supple neck Chest: clear b/l, no added sounds CVS: N S1&S2, no murmurs,  GI: soft, NT, ND,  Musculoskeletal: On, no edema, limited mobility of the left hip due to pain     Data Review:    CBC  Recent Labs Lab 12/13/16 0825 12/13/16 1718 12/14/16 0117  WBC 14.2* 16.5* 12.4*  HGB 13.0 14.0 11.7*  HCT 38.8 41.8 35.7*  PLT 167 164 154  MCV 79.2 79.9 79.9  MCH 26.5 26.8 26.2  MCHC 33.5 33.5 32.8  RDW 13.6 13.7 13.7  LYMPHSABS 1.4  --  1.4  MONOABS 1.3*  --  1.0  EOSABS 0.1  --  0.0  BASOSABS 0.0  --  0.0    Chemistries   Recent Labs Lab 12/13/16 0825 12/13/16 1718 12/13/16 2322  NA 134*  --  130*  K 3.4*  --  3.3*  CL 101  --  93*  CO2 23  --  24  GLUCOSE 147*  --  182*  BUN 9  --  7  CREATININE 0.52 0.59 0.75  CALCIUM 8.8*  --  8.8*  MG  --  1.6*  --   AST  --   --  19  ALT  --   --  16  ALKPHOS  --   --  110  BILITOT  --   --  1.6*   ------------------------------------------------------------------------------------------------------------------ No results for input(s): CHOL, HDL, LDLCALC, TRIG, CHOLHDL, LDLDIRECT in the last 72 hours.  No results found for: HGBA1C ------------------------------------------------------------------------------------------------------------------  Recent Labs  12/13/16 1718  TSH 1.230    ------------------------------------------------------------------------------------------------------------------ No results for input(s): VITAMINB12, FOLATE, FERRITIN, TIBC, IRON, RETICCTPCT in the last 72 hours.  Coagulation profile  Recent Labs Lab 12/13/16 0825 12/13/16 2322  INR 1.07 1.14    No results for input(s): DDIMER in the last 72 hours.  Cardiac Enzymes No results for input(s): CKMB, TROPONINI, MYOGLOBIN in the last 168 hours.  Invalid input(s): CK ------------------------------------------------------------------------------------------------------------------ No results found for: BNP  Inpatient Medications  Scheduled Meds: . aztreonam  2 g Intravenous Q8H  . docusate sodium  100 mg Oral BID  . enoxaparin (LOVENOX) injection  40 mg Subcutaneous Q24H  . hydrochlorothiazide  25 mg Oral Daily  . [START ON 12/15/2016] levofloxacin (LEVAQUIN) IV  750 mg Intravenous Q24H  . methocarbamol (ROBAXIN)  IV  500 mg Intravenous Q8H  . nicotine  21 mg Transdermal Daily  . sodium chloride flush  3 mL Intravenous Q12H  . vancomycin  750 mg Intravenous Q8H   Continuous Infusions: . 0.9 % NaCl with KCl 20 mEq / L 50 mL/hr at 12/13/16 2336   PRN Meds:.acetaminophen **OR** acetaminophen, bisacodyl, HYDROcodone-acetaminophen, ketorolac, morphine injection, ondansetron **OR** ondansetron (ZOFRAN) IV, senna-docusate  Micro Results Recent Results (from the past 240 hour(s))  Culture, blood (x 2)     Status: None (Preliminary result)   Collection Time: 12/13/16 11:22 PM  Result Value Ref Range Status   Specimen Description BLOOD LEFT ANTECUBITAL  Final   Special Requests BOTTLES DRAWN AEROBIC AND ANAEROBIC 5CC  Final   Culture PENDING  Incomplete   Report Status PENDING  Incomplete    Radiology Reports Ct Lumbar Spine Wo Contrast  Result Date: 12/13/2016 CLINICAL DATA:  Lumbago with left-sided radicular symptoms EXAM: CT LUMBAR SPINE WITHOUT CONTRAST TECHNIQUE:  Multidetector CT imaging of the lumbar spine was performed without intravenous contrast administration. Multiplanar CT image reconstructions were also generated. COMPARISON:  None. FINDINGS: Segmentation: There are 5 non-rib-bearing lumbar type vertebral bodies. 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 There is a well-defined S1-S2 interspace. Alignment: There is no spondylolisthesis. Vertebrae: No fracture.  No blastic or lytic bone lesions. Paraspinal and other soft tissues: No paraspinous lesions are evident. There are foci of calcification in the abdominal aorta and common iliac artery without demonstrable abdominal aortic aneurysm. Disc levels: At T12-L1, there is no nerve root edema or effacement. No disc extrusion or stenosis. At L1-2, there is mild facet hypertrophy bilaterally. There is slight diffuse disc bulging. No nerve root edema or effacement. No disc extrusion or stenosis. At L2-3, there is mild facet hypertrophy bilaterally. There is a moderate diffuse disc bulging without nerve root edema or effacement. No disc extrusion or stenosis. At L3-4, there is moderate facet hypertrophy bilaterally. There is mild diffuse disc bulging without nerve root edema or effacement. No disc  extrusion or stenosis. At L4-5, there is moderate facet hypertrophy bilaterally. There is broad-based disc bulging. No disc extrusion. There is, however, mild to moderate generalized spinal stenosis at L4-5 due to generalized disc bulging coupled with facet and ligamentum flava hypertrophy bilaterally. At L5-S1, there is moderate facet hypertrophy bilaterally. There is broad-based disc protrusion without nerve root edema or effacement. No frank disc extrusion or stenosis. At S1-2, there is moderate facet hypertrophy bilaterally. No nerve root edema or effacement. No disc extrusion or stenosis. There is osteoarthritic change in the assimilation joint on the right at S1-2. There is mild  osteoarthritic change in the sacroiliac joints bilaterally. No sacroiliitis evident. 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 No disc extrusion on this study. Facet hypertrophy at multiple levels. No fracture or spondylolisthesis. 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 Aortoiliac atherosclerosis without demonstrable aneurysm. Electronically Signed   By: Lowella Grip III M.D.   On: 12/13/2016 09:26   Mr Lumbar Spine W Wo Contrast  Result Date: 12/13/2016 CLINICAL DATA:  Mid to low back pain radiating to LEFT hip. Progressive worsening. EXAM: MRI LUMBAR SPINE WITHOUT AND WITH CONTRAST TECHNIQUE: Multiplanar and multiecho pulse sequences of the lumbar spine were obtained without and with intravenous contrast. CONTRAST:  69m MULTIHANCE GADOBENATE DIMEGLUMINE 529 MG/ML IV SOLN COMPARISON:  CT lumbar earlier today. FINDINGS: Segmentation: Transitional anatomy. S1-S2 is well-developed due to partial assimilation of S1 into the sacrum. Alignment:  Anatomic Vertebrae: No worrisome osseous lesion. Congenitally short pedicles. No abnormal postcontrast enhancement. Conus medullaris: Extends to the L1 level and appears normal. Paraspinal and other soft tissues: Negative. Disc levels: L1-L2:  Unremarkable. L2-L3:  Annular bulge.  No impingement. L3-L4:  And bulge.  No impingement. L4-L5: Unremarkable disc space. Posterior element hypertrophy. Short pedicles. Mild stenosis without definite impingement. L5-S1: Unremarkable disc space. Posterior element hypertrophy. Short pedicles. Mild stenosis without definite impingement. S1-S2 transitional interspace.  No impingement. IMPRESSION: No disc pathology of significance. Posterior element hypertrophy, in  combination with short pedicles, contributes to mild congenital and acquired stenosis at L4-5 and L5-S1. No definite subarticular zone or foraminal zone narrowing is established. Electronically Signed   By: JStaci RighterM.D.   On: 12/13/2016 13:00   Dg Chest Port 1 View  Result Date: 12/13/2016 CLINICAL DATA:  Sepsis. EXAM: PORTABLE CHEST 1 VIEW COMPARISON:  07/20/2014 FINDINGS: The heart size is normal. There are patchy densities in the lung bases bilaterally suspicious for infiltrates. No pulmonary edema. IMPRESSION: Suspect bilateral lower lobe infiltrates. Electronically Signed   By: ENolon NationsM.D.   On: 12/13/2016 22:58   Dg Hip Unilat W Or Wo Pelvis 2-3 Views Left  Result Date: 12/13/2016 CLINICAL DATA:  Mid and lower back pain radiating to the left hip. EXAM: DG HIP (WITH OR WITHOUT PELVIS) 2-3V LEFT COMPARISON:  None. FINDINGS: Pelvic bony ring is intact. No gross abnormality to the right hip. Left hip is located without a fracture. No significant joint space narrowing in either hip. Gas and stool in the pelvis. IMPRESSION: No acute abnormality in the pelvis or left hip. Electronically Signed   By: AMarkus DaftM.D.   On: 12/13/2016 09:12    Time Spent in minutes 35   DLouellen MolderM.D on 12/14/2016 at 12:43 PM  Between 7am to  7pm - Pager - [phone]  After 7pm go to [email or web address] - password [email or web address]  [facility] -  Office  [phone]

## 2016-12-14 NOTE — Evaluation (Signed)
Physical Therapy Evaluation Patient Details Name: NAYLIN BURKLE MRN: [id number] DOB: 03/12/1966 Today's Date: 12/14/2016   History of Present Illness  Patient is a 51 yo female admitted 12/13/16 with severe back pain with Lt sided sciatica, HTN, developed fever, pna.    PMH:  Hep C, depression, CVA, cocaine abuse, tobacco use  Clinical Impression  Patient presents with problems listed below.  Will benefit from acute PT to maximize functional mobility prior to discharge.  If patient can progress to supervision/Mod I functional level, can return home with HHPT at d/c.  If unable to reach this functional level, may need to consider SNF.    Follow Up Recommendations Home health PT;Supervision for mobility/OOB    Equipment Recommendations  Rolling walker with 5" wheels;3in1 (PT)    Recommendations for Other Services       Precautions / Restrictions Precautions Precautions: Fall Restrictions Weight Bearing Restrictions: No      Mobility  Bed Mobility Overal bed mobility: Needs Assistance Bed Mobility: Rolling;Sidelying to Sit;Sit to Sidelying Rolling: Min guard Sidelying to sit: Mod assist     Sit to sidelying: Mod assist General bed mobility comments: Verbal cues for log rolling technique.  Patient moves very slowly with all mobility.  Able to roll with no physical assist using rail and increased time.  Required mod assist to raise trunk to upright position with increased time.  Patient initially using BUE's for support and with majority of weight shifted to Rt side in sitting.  Patient sat EOB x 20 minutes.  Worked on shifting weight toward Lt hip.  Patient distracted by combing knots out of hair per her request.  Patient in more midline position and able to raise both hands from bed after 15 minutes in sitting.  Unable to scoot toward [facility] laterally - assisted with bed pad.  Returned to sidelying with mod assist to lower trunk and bring LE's onto bed.  Patient yelling in pain during  mobility.  All position changes very slow.  Transfers                 General transfer comment: NT - patient declined due to pain.  Ambulation/Gait                Stairs            Wheelchair Mobility    Modified Rankin (Stroke Patients Only)       Balance Overall balance assessment: Needs assistance Sitting-balance support: Bilateral upper extremity supported;Feet supported Sitting balance-Leahy Scale: Poor Sitting balance - Comments: Required UE assist to maintain sitting initially. Postural control: Posterior lean;Right lateral lean                                   Pertinent Vitals/Pain Pain Assessment: 0-10 Pain Score: 7  Pain Location: Back, Lt hip and Lt leg Pain Descriptors / Indicators: Aching;Grimacing;Guarding;Radiating;Sharp;Shooting;Spasm Pain Intervention(s): Limited activity within patient's tolerance;Monitored during session;Repositioned;Patient requesting pain meds-RN notified;RN gave pain meds during session    [place] expects to be discharged to:: Private residence Living Arrangements: Non-relatives/Friends (patient reports 2 friends) Available Help at Discharge: Friend(s);Available PRN/intermittently Type of Home: Apartment Home Access: Stairs to enter Entrance Stairs-Rails: Right;Left Entrance Stairs-Number of Steps: 14 Home Layout: One level Home Equipment: None      Prior Function Level of Independence: Independent         Comments: Does not drive  Hand Dominance        Extremity/Trunk Assessment   Upper Extremity Assessment Upper Extremity Assessment: Overall WFL for tasks assessed    Lower Extremity Assessment Lower Extremity Assessment: LLE deficits/detail LLE Deficits / Details: Decreased strength due to pain - at least 3/5       Communication   Communication: No difficulties  Cognition Arousal/Alertness: Awake/alert Behavior During Therapy:  Agitated;Anxious Overall Cognitive Status: Within Functional Limits for tasks assessed                                        General Comments      Exercises     Assessment/Plan    PT Assessment Patient needs continued PT services  PT Problem List Decreased strength;Decreased activity tolerance;Decreased balance;Decreased mobility;Decreased knowledge of use of DME;Cardiopulmonary status limiting activity;Pain       PT Treatment Interventions DME instruction;Gait training;Stair training;Functional mobility training;Therapeutic activities;Therapeutic exercise;Balance training;Patient/family education    PT Goals (Current goals can be found in the Care Plan section)  Acute Rehab PT Goals Patient Stated Goal: To decrease pain PT Goal Formulation: With patient Time For Goal Achievement: 12/21/16 Potential to Achieve Goals: Good    Frequency Min 3X/week   Barriers to discharge Decreased caregiver support Does not have 24 hour assist    Co-evaluation               End of Session Equipment Utilized During Treatment: Oxygen Activity Tolerance: Patient limited by pain Patient left: in bed;with call bell/phone within reach;with bed alarm set;with SCD's reapplied Nurse Communication: Mobility status;Patient requests pain meds PT Visit Diagnosis: Other abnormalities of gait and mobility (R26.89);Pain Pain - Right/Left: Left Pain - part of body: Leg (back)    Time: 9390-3009 PT Time Calculation (min) (ACUTE ONLY): 35 min   Charges:   PT Evaluation $PT Eval Moderate Complexity: 1 Procedure PT Treatments $Therapeutic Activity: 23-37 mins   PT G Codes:   PT G-Codes **NOT FOR INPATIENT CLASS** Functional Assessment Tool Used: AM-PAC 6 Clicks Basic Mobility;Clinical judgement Functional Limitation: Mobility: Walking and moving around Mobility: Walking and Moving Around Current Status (Q3300): At least 40 percent but less than 60 percent impaired, limited or  restricted Mobility: Walking and Moving Around Goal Status [phone]): At least 1 percent but less than 20 percent impaired, limited or restricted    Carita Pian. Sanjuana Kava, [facility] Acute Rehab Services Pager [place] 12/14/2016, 5:42 PM

## 2016-12-15 DIAGNOSIS — J189 Pneumonia, unspecified organism: Secondary | ICD-10-CM

## 2016-12-15 DIAGNOSIS — M545 Low back pain: Secondary | ICD-10-CM

## 2016-12-15 DIAGNOSIS — R739 Hyperglycemia, unspecified: Secondary | ICD-10-CM | POA: Diagnosis present

## 2016-12-15 DIAGNOSIS — D649 Anemia, unspecified: Secondary | ICD-10-CM | POA: Diagnosis present

## 2016-12-15 DIAGNOSIS — Z886 Allergy status to analgesic agent status: Secondary | ICD-10-CM

## 2016-12-15 DIAGNOSIS — F17210 Nicotine dependence, cigarettes, uncomplicated: Secondary | ICD-10-CM

## 2016-12-15 DIAGNOSIS — M25552 Pain in left hip: Secondary | ICD-10-CM

## 2016-12-15 DIAGNOSIS — Z880 Allergy status to penicillin: Secondary | ICD-10-CM

## 2016-12-15 DIAGNOSIS — F1990 Other psychoactive substance use, unspecified, uncomplicated: Secondary | ICD-10-CM | POA: Diagnosis present

## 2016-12-15 DIAGNOSIS — R7881 Bacteremia: Secondary | ICD-10-CM | POA: Diagnosis present

## 2016-12-15 DIAGNOSIS — B9561 Methicillin susceptible Staphylococcus aureus infection as the cause of diseases classified elsewhere: Secondary | ICD-10-CM | POA: Diagnosis present

## 2016-12-15 DIAGNOSIS — A4901 Methicillin susceptible Staphylococcus aureus infection, unspecified site: Secondary | ICD-10-CM

## 2016-12-15 LAB — BASIC METABOLIC PANEL
ANION GAP: 8 (ref 5–15)
BUN: 8 mg/dL (ref 6–20)
CHLORIDE: 96 mmol/L — AB (ref 101–111)
CO2: 27 mmol/L (ref 22–32)
Calcium: 8.5 mg/dL — ABNORMAL LOW (ref 8.9–10.3)
Creatinine, Ser: 0.68 mg/dL (ref 0.44–1.00)
Glucose, Bld: 119 mg/dL — ABNORMAL HIGH (ref 65–99)
POTASSIUM: 3.5 mmol/L (ref 3.5–5.1)
SODIUM: 131 mmol/L — AB (ref 135–145)

## 2016-12-15 MED ORDER — CEFAZOLIN SODIUM-DEXTROSE 2-4 GM/100ML-% IV SOLN
2.0000 g | Freq: Three times a day (TID) | INTRAVENOUS | Status: DC
  Administered 2016-12-15 – 2016-12-21 (×19): 2 g via INTRAVENOUS
  Filled 2016-12-15 (×21): qty 100

## 2016-12-15 MED ORDER — LORAZEPAM 1 MG PO TABS
1.0000 mg | ORAL_TABLET | Freq: Once | ORAL | Status: AC
  Administered 2016-12-15: 1 mg via ORAL
  Filled 2016-12-15: qty 1

## 2016-12-15 MED ORDER — HYDROMORPHONE HCL 2 MG PO TABS
1.0000 mg | ORAL_TABLET | Freq: Once | ORAL | Status: AC
  Administered 2016-12-15: 1 mg via ORAL
  Filled 2016-12-15: qty 1

## 2016-12-15 MED ORDER — POTASSIUM CHLORIDE CRYS ER 20 MEQ PO TBCR
40.0000 meq | EXTENDED_RELEASE_TABLET | Freq: Once | ORAL | Status: AC
  Administered 2016-12-15: 40 meq via ORAL
  Filled 2016-12-15: qty 2

## 2016-12-15 MED ORDER — LORAZEPAM 1 MG PO TABS
1.0000 mg | ORAL_TABLET | Freq: Once | ORAL | Status: AC
Start: 2016-12-15 — End: 2016-12-15
  Administered 2016-12-15: 1 mg via ORAL
  Filled 2016-12-15: qty 1

## 2016-12-15 NOTE — Progress Notes (Signed)
qPhysical Therapy Treatment Patient Details Name: Holly Weber MRN: [id number] DOB: 26-Feb-1966 Today's Date: 12/15/2016    History of Present Illness Patient is a 51 yo female admitted 12/13/16 with severe back pain with Lt sided sciatica, HTN, developed fever, pna.    PMH:  Hep C, depression, CVA, cocaine abuse, tobacco use    PT Comments    Pt performed transfer from bed to [facility] to void, then from [facility] to standing PTA brought recliner to patient for transfer to recliner chair.  During mobility patient with productive cough x3.  Instructed patient on use of yaunker.  Pt required lengthy rest breaks and increased time to perform minimal tasks.  At this time patient will continue to benefit from skilled rehab during hospitalization and when returning home.      Follow Up Recommendations  Home health PT;Supervision for mobility/OOB     Equipment Recommendations  Rolling walker with 5" wheels;3in1 (PT)    Recommendations for Other Services       Precautions / Restrictions Precautions Precautions: Fall Restrictions Weight Bearing Restrictions: No    Mobility  Bed Mobility Overal bed mobility: Needs Assistance Bed Mobility: Supine to Sit;Sit to Supine     Supine to sit: Min assist     General bed mobility comments: Pt required increased time with several rest breaks secondary to pain.  Pt grimacing and guarding L hip but eventually able to manage to edge of bed.  Heavy reliance on UEs in sitting to maintain balance.  Pt required cues for hand placement and used PTA as a railing to elevate trunk.    Transfers Overall transfer level: Needs assistance Equipment used: Rolling walker (2 wheeled) Transfers: Sit to/from Stand Sit to Stand: Min assist         General transfer comment: Pt refused cueing for correct hand placement.  Pt pushed through RW with hands in place on hand grips when ascending to standing.  pt presents with forward flexed posture and required cues for hip and  trunk extension.    Ambulation/Gait Ambulation/Gait assistance: Min assist Ambulation Distance (Feet):  (steps from bed to [facility].  ) Assistive device: Rolling walker (2 wheeled) Gait Pattern/deviations: Step-to pattern;Shuffle   Gait velocity interpretation: Below normal speed for age/gender General Gait Details: Pt performed a series of shuffling steps from bed to chair.  With assist to move RW and maintain correct body position for carryover with safety.     Stairs            Wheelchair Mobility    Modified Rankin (Stroke Patients Only)       Balance Overall balance assessment: Needs assistance Sitting-balance support: Bilateral upper extremity supported;Feet supported Sitting balance-Leahy Scale: Poor Sitting balance - Comments: Required UE assist to maintain sitting initially. Postural control: Posterior lean;Right lateral lean   Standing balance-Leahy Scale: Poor Standing balance comment: Heavy reliance on UEs.                              Cognition Arousal/Alertness: Awake/alert Behavior During Therapy: Agitated;Anxious Overall Cognitive Status: Within Functional Limits for tasks assessed                                        Exercises      General Comments        Pertinent Vitals/Pain Pain Assessment: 0-10 Pain  Score: 8  Pain Location: Back, Lt hip and Lt leg Pain Descriptors / Indicators: Aching;Grimacing;Guarding;Radiating;Sharp;Shooting;Spasm;Crying;Moaning Pain Intervention(s): Monitored during session;Repositioned;RN gave pain meds during session;Relaxation    Home Living                      Prior Function            PT Goals (current goals can now be found in the care plan section) Acute Rehab PT Goals Patient Stated Goal: To decrease pain Potential to Achieve Goals: Good Progress towards PT goals: Progressing toward goals    Frequency    Min 3X/week      PT Plan Current plan remains  appropriate    Co-evaluation             End of Session   Activity Tolerance: Patient limited by pain Patient left: with call bell/phone within reach;in chair;with chair alarm set;with nursing/sitter in room Nurse Communication: Mobility status;Patient requests pain meds PT Visit Diagnosis: Other abnormalities of gait and mobility (R26.89);Pain Pain - Right/Left: Left Pain - part of body: Leg (back)     Time: 1440-1539 PT Time Calculation (min) (ACUTE ONLY): 59 min  Charges:  $Therapeutic Activity: 53-67 mins                    G Codes:       Governor Rooks, PTA pager [phone]    Cristela Blue 12/15/2016, 5:48 PM

## 2016-12-15 NOTE — Progress Notes (Signed)
Pharmacy Antibiotic Note  Holly Weber is a 51 y.o. female admitted on 12/13/2016 with lower back pain and found to have MSSA bacteremia. The patient was narrowed initially to Vancomycin however it appears the patient did receive Rocephin in April '17 and tolerated. Plans are now to transition to Cefazolin.   Plan: 1. Discontinue Vancomycin 2. Start Cefazolin 2g IV every 8 hours 3. Will f/u TTE/TEE plans and further ID work-up 4. Pharmacy will monitor peripherally for any necessary dose adjustments  Height: 5' 5"  (165.1 cm) Weight: 160 lb 1.6 oz (72.6 kg) IBW/kg (Calculated) : 57  Temp (24hrs), Avg:99.2 F (37.3 C), Min:98.9 F (37.2 C), Max:99.3 F (37.4 C)   Recent Labs Lab 12/13/16 0825 12/13/16 1718 12/13/16 2322 12/14/16 0117 12/15/16 0529  WBC 14.2* 16.5*  --  12.4*  --   CREATININE 0.52 0.59 0.75  --  0.68  LATICACIDVEN  --   --  1.5 0.7  --     Estimated Creatinine Clearance: 84.9 mL/min (by C-G formula based on SCr of 0.68 mg/dL).    Allergies  Allergen Reactions  . Asa [Aspirin] Other (See Comments)    Ulcerative colitis flares.  . Nsaids Other (See Comments)    Ulcerative colitis flares  . Penicillins Rash and Other (See Comments)    Has patient had a PCN reaction causing immediate rash, facial/tongue/throat swelling, SOB or lightheadedness with hypotension:unsure Has patient had a PCN reaction causing severe rash involving mucus membranes or skin necrosis:unsure Has patient had a PCN reaction that required hospitalization:No Has patient had a PCN reaction occurring within the last 10 years:Yes If all of the above answers are "NO", then may proceed with Cephalosporin use.     Thank you for allowing pharmacy to be a part of this patient's care.  Alycia Rossetti, PharmD, BCPS Clinical Pharmacist Pager: [phone] Clinical phone for 12/15/2016 from 7a-3:30p: [phone] If after 3:30p, please call main pharmacy at: [phone] 12/15/2016 3:42 PM

## 2016-12-15 NOTE — Progress Notes (Addendum)
PROGRESS NOTE                                                                                                                                                                                                             Patient Demographics:    Holly Weber, is a 51 y.o. female, DOB - Jun 04, 1966, HCW:[id number]  Admit date - 12/13/2016   Admitting Physician Maren Reamer, MD  Outpatient Primary MD for the patient is No PCP Per Patient  LOS - 1  Outpatient Specialists:none  Chief Complaint  Patient presents with  . Back Pain       Brief Narrative   51 year old female with ulcerative colitis, hep C, depression, history of stroke, cocaine abuse (last use was about 4 weeks ago. IV), ongoing tobacco use presented with progressive worsening lower back pain radiating down to the left leg with some numbness since 2 days prior to admission. Denied any bowel or urinary incontinence. Patient reports having significant difficulty to move around or amblyopia. Denies fevers, chills, vomiting, chest pain, palpitations, loss of consciousness, shortness of breath or abdominal pain. In the ED she was septic with fever of 101.1F, tachycardic, tachypneic and WBC of 16.5 K. CT and MRI of the lumbar spine was unremarkable for stenosis, discitis or abscess. Chest x-ray concerning for bilateral infiltrate. Blood cx from admission growing MSSA in both bottles. Admitted to hospitalist service for sepsis.   Subjective:   c/o severe back pain and Required frequent pain meds overnight   Assessment  & Plan :   Principle problem: Sepsis ([place]) MSSA bacteremia. 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 Chest x-ray also shows bilateral infiltrate which might be the source. On empiric IV vancomycin . Repeat blood cx. cardiolgoy consulted for TEE. ID consulted.  Acute low back pain with left-sided sciatica. CT and MRI of the lumbar spine  without disc involvement, epidural abscess, spinal stenosis or nerve impingement. Could be early diskits or developing epidural abscess. Will d/w ID on repeat imaging. Pain control with hydrocodone and Toradol when necessary. Continue scheduled Robaxin and IV morphine when necessary for severe pain. PT  Recommends HH.[facility] Kitchen  Polysubstance use Reports frequent cocaine use (last use was 1 month ago). Reduction positive for cocaine and THC.  Hypokalemia/hypomagnesemia Replenish  Hyponatremia Continue with IV fluids.  Uncontrolled hypertension Not on medication previously.  Possibly triggered by pain. Started on HCTZ.  Tobacco abuse  counseled on cessation. Nicotine patch.   Code Status : full code  Family Communication  : none at bedside  Disposition Plan  : home once better  Barriers For Discharge : active symptoms  Consults  :  ID Cards for TEE  Procedures  :  CT LS spine MRI LS spine  DVT Prophylaxis  :  Lovenox   Lab Results  Component Value Date   PLT 154 12/14/2016    Antibiotics  :   Anti-infectives    Start     Dose/Rate Route Frequency Ordered Stop   12/15/16 0000  levofloxacin (LEVAQUIN) IVPB 750 mg  Status:  Discontinued     750 mg 100 mL/hr over 90 Minutes Intravenous Every 24 hours 12/13/16 2243 12/14/16 1316   12/14/16 0800  vancomycin (VANCOCIN) IVPB 750 mg/150 ml premix     750 mg 150 mL/hr over 60 Minutes Intravenous Every 8 hours 12/13/16 2243     12/14/16 0600  aztreonam (AZACTAM) 2 g in dextrose 5 % 50 mL IVPB  Status:  Discontinued     2 g 100 mL/hr over 30 Minutes Intravenous Every 8 hours 12/13/16 2243 12/14/16 1729   12/13/16 2245  levofloxacin (LEVAQUIN) IVPB 750 mg     750 mg 100 mL/hr over 90 Minutes Intravenous  Once 12/13/16 2232 12/14/16 0138   12/13/16 2245  aztreonam (AZACTAM) 2 g in dextrose 5 % 50 mL IVPB     2 g 100 mL/hr over 30 Minutes Intravenous  Once 12/13/16 2232 12/14/16 0104   12/13/16 2245  vancomycin (VANCOCIN) IVPB  1000 mg/200 mL premix     1,000 mg 200 mL/hr over 60 Minutes Intravenous  Once 12/13/16 2232 12/14/16 0133        Objective:   Vitals:   12/14/16 1438 12/14/16 2059 12/14/16 2148 12/15/16 0542  BP: (!) 147/76 (!) 156/86  112/60  Pulse: 88 94  95  Resp: 17 16  18   Temp: 98 F (36.7 C) 99.3 F (37.4 C) 98.9 F (37.2 C) 99.3 F (37.4 C)  TempSrc: Oral Oral Axillary   SpO2: 100% 100%  100%  Weight:      Height:        Wt Readings from Last 3 Encounters:  12/13/16 72.6 kg (160 lb 1.6 oz)  04/03/16 86.2 kg (190 lb)  04/03/16 86.2 kg (190 lb)     Intake/Output Summary (Last 24 hours) at 12/15/16 0941 Last data filed at 12/15/16 0900  Gross per 24 hour  Intake          2923.83 ml  Output             3315 ml  Net          -391.17 ml     Physical Exam  Gen:not in distress HEENT: moist mucosa, supple neck Chest: clear b/l, no added sounds CVS: N S1&S2, no murmurs,  GI: soft, NT, ND,  Musculoskeletal: On, no edema, limited mobility of the left hip due to pain     Data Review:    CBC  Recent Labs Lab 12/13/16 0825 12/13/16 1718 12/14/16 0117  WBC 14.2* 16.5* 12.4*  HGB 13.0 14.0 11.7*  HCT 38.8 41.8 35.7*  PLT 167 164 154  MCV 79.2 79.9 79.9  MCH 26.5 26.8 26.2  MCHC 33.5 33.5 32.8  RDW 13.6 13.7 13.7  LYMPHSABS 1.4  --  1.4  MONOABS 1.3*  --  1.0  EOSABS 0.1  --  0.0  BASOSABS 0.0  --  0.0    Chemistries   Recent Labs Lab 12/13/16 0825 12/13/16 1718 12/13/16 2322 12/15/16 0529  NA 134*  --  130* 131*  K 3.4*  --  3.3* 3.5  CL 101  --  93* 96*  CO2 23  --  24 27  GLUCOSE 147*  --  182* 119*  BUN 9  --  7 8  CREATININE 0.52 0.59 0.75 0.68  CALCIUM 8.8*  --  8.8* 8.5*  MG  --  1.6*  --   --   AST  --   --  19  --   ALT  --   --  16  --   ALKPHOS  --   --  110  --   BILITOT  --   --  1.6*  --    ------------------------------------------------------------------------------------------------------------------ No results for input(s):  CHOL, HDL, LDLCALC, TRIG, CHOLHDL, LDLDIRECT in the last 72 hours.  No results found for: HGBA1C ------------------------------------------------------------------------------------------------------------------  Recent Labs  12/13/16 1718  TSH 1.230   ------------------------------------------------------------------------------------------------------------------ No results for input(s): VITAMINB12, FOLATE, FERRITIN, TIBC, IRON, RETICCTPCT in the last 72 hours.  Coagulation profile  Recent Labs Lab 12/13/16 0825 12/13/16 2322  INR 1.07 1.14    No results for input(s): DDIMER in the last 72 hours.  Cardiac Enzymes No results for input(s): CKMB, TROPONINI, MYOGLOBIN in the last 168 hours.  Invalid input(s): CK ------------------------------------------------------------------------------------------------------------------ No results found for: BNP  Inpatient Medications  Scheduled Meds: . docusate sodium  100 mg Oral BID  . enoxaparin (LOVENOX) injection  40 mg Subcutaneous Q24H  . hydrochlorothiazide  25 mg Oral Daily  . methocarbamol (ROBAXIN)  IV  500 mg Intravenous Q8H  . nicotine  21 mg Transdermal Daily  . potassium chloride  40 mEq Oral Once  . sodium chloride flush  3 mL Intravenous Q12H  . vancomycin  750 mg Intravenous Q8H   Continuous Infusions: . 0.9 % NaCl with KCl 20 mEq / L 50 mL/hr at 12/14/16 1812   PRN Meds:.acetaminophen **OR** acetaminophen, bisacodyl, HYDROcodone-acetaminophen, ketorolac, morphine injection, ondansetron **OR** ondansetron (ZOFRAN) IV, senna-docusate  Micro Results Recent Results (from the past 240 hour(s))  Urine culture     Status: Abnormal   Collection Time: 12/13/16  8:34 AM  Result Value Ref Range Status   Specimen Description URINE, CLEAN CATCH  Final   Special Requests NONE  Final   Culture <10,000 COLONIES/mL INSIGNIFICANT GROWTH (A)  Final   Report Status 12/14/2016 FINAL  Final  Culture, blood (x 2)     Status:  Abnormal (Preliminary result)   Collection Time: 12/13/16 11:22 PM  Result Value Ref Range Status   Specimen Description BLOOD LEFT ANTECUBITAL  Final   Special Requests BOTTLES DRAWN AEROBIC AND ANAEROBIC 5CC  Final   Culture  Setup Time   Final    GRAM POSITIVE COCCI IN CLUSTERS IN BOTH AEROBIC AND ANAEROBIC BOTTLES CRITICAL RESULT CALLED TO, READ BACK BY AND VERIFIED WITH: Hughie Closs, PHARM, 12/14/16 AT 12 BY J FUDESCO    Culture (A)  Final    STAPHYLOCOCCUS AUREUS SUSCEPTIBILITIES TO FOLLOW    Report Status PENDING  Incomplete  Culture, blood (x 2)     Status: Abnormal (Preliminary result)   Collection Time: 12/13/16 11:22 PM  Result Value Ref Range Status   Specimen Description BLOOD LEFT ANTECUBITAL  Final   Special Requests BOTTLES DRAWN AEROBIC AND ANAEROBIC 5CC  Final  Culture  Setup Time   Final    GRAM POSITIVE COCCI IN CLUSTERS IN BOTH AEROBIC AND ANAEROBIC BOTTLES CRITICAL VALUE NOTED.  VALUE IS CONSISTENT WITH PREVIOUSLY REPORTED AND CALLED VALUE.    Culture STAPHYLOCOCCUS AUREUS (A)  Final   Report Status PENDING  Incomplete  Blood Culture ID Panel (Reflexed)     Status: Abnormal   Collection Time: 12/13/16 11:22 PM  Result Value Ref Range Status   Enterococcus species NOT DETECTED NOT DETECTED Final   Listeria monocytogenes NOT DETECTED NOT DETECTED Final   Staphylococcus species DETECTED (A) NOT DETECTED Final   Staphylococcus aureus DETECTED (A) NOT DETECTED Final    Comment: Methicillin (oxacillin) susceptible Staphylococcus aureus (MSSA). Preferred therapy is anti staphylococcal beta lactam antibiotic (Cefazolin or Nafcillin), unless clinically contraindicated. CRITICAL RESULT CALLED TO, READ BACK BY AND VERIFIED WITH: Hughie Closs, PHARM, 12/14/16 AT 1711 BY J FUDESCO    Methicillin resistance NOT DETECTED NOT DETECTED Final   Streptococcus species NOT DETECTED NOT DETECTED Final   Streptococcus agalactiae NOT DETECTED NOT DETECTED Final   Streptococcus  pneumoniae NOT DETECTED NOT DETECTED Final   Streptococcus pyogenes NOT DETECTED NOT DETECTED Final   Acinetobacter baumannii NOT DETECTED NOT DETECTED Final   Enterobacteriaceae species NOT DETECTED NOT DETECTED Final   Enterobacter cloacae complex NOT DETECTED NOT DETECTED Final   Escherichia coli NOT DETECTED NOT DETECTED Final   Klebsiella oxytoca NOT DETECTED NOT DETECTED Final   Klebsiella pneumoniae NOT DETECTED NOT DETECTED Final   Proteus species NOT DETECTED NOT DETECTED Final   Serratia marcescens NOT DETECTED NOT DETECTED Final   Haemophilus influenzae NOT DETECTED NOT DETECTED Final   Neisseria meningitidis NOT DETECTED NOT DETECTED Final   Pseudomonas aeruginosa NOT DETECTED NOT DETECTED Final   Candida albicans NOT DETECTED NOT DETECTED Final   Candida glabrata NOT DETECTED NOT DETECTED Final   Candida krusei NOT DETECTED NOT DETECTED Final   Candida parapsilosis NOT DETECTED NOT DETECTED Final   Candida tropicalis NOT DETECTED NOT DETECTED Final    Radiology Reports Ct Lumbar Spine Wo Contrast  Result Date: 12/13/2016 CLINICAL DATA:  Lumbago with left-sided radicular symptoms EXAM: CT LUMBAR SPINE WITHOUT CONTRAST TECHNIQUE: Multidetector CT imaging of the lumbar spine was performed without intravenous contrast administration. Multiplanar CT image reconstructions were also generated. COMPARISON:  None. FINDINGS: Segmentation: There are 5 non-rib-bearing lumbar type vertebral bodies. 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 There is a well-defined S1-S2 interspace. Alignment: There is no spondylolisthesis. Vertebrae: No fracture.  No blastic or lytic bone lesions. Paraspinal and other soft tissues: No paraspinous lesions are evident. There are foci of calcification in the abdominal aorta and common iliac artery without demonstrable abdominal aortic aneurysm. Disc levels: At T12-L1, there is no nerve root edema or effacement. No disc extrusion or  stenosis. At L1-2, there is mild facet hypertrophy bilaterally. There is slight diffuse disc bulging. No nerve root edema or effacement. No disc extrusion or stenosis. At L2-3, there is mild facet hypertrophy bilaterally. There is a moderate diffuse disc bulging without nerve root edema or effacement. No disc extrusion or stenosis. At L3-4, there is moderate facet hypertrophy bilaterally. There is mild diffuse disc bulging without nerve root edema or effacement. No disc extrusion or stenosis. At L4-5, there is moderate facet hypertrophy bilaterally. There is broad-based disc bulging. No disc extrusion. There is, however, mild to moderate generalized spinal stenosis at L4-5 due to generalized disc bulging coupled with facet and ligamentum flava hypertrophy  bilaterally. At L5-S1, there is moderate facet hypertrophy bilaterally. There is broad-based disc protrusion without nerve root edema or effacement. No frank disc extrusion or stenosis. At S1-2, there is moderate facet hypertrophy bilaterally. No nerve root edema or effacement. No disc extrusion or stenosis. There is osteoarthritic change in the assimilation joint on the right at S1-2. There is mild osteoarthritic change in the sacroiliac joints bilaterally. No sacroiliitis evident. 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 No disc extrusion on this study. Facet hypertrophy at multiple levels. No fracture or spondylolisthesis. 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 Aortoiliac atherosclerosis without demonstrable aneurysm. Electronically Signed   By: Lowella Grip III M.D.   On: 12/13/2016 09:26   Mr Lumbar Spine W Wo Contrast  Result Date: 12/13/2016 CLINICAL DATA:  Mid to low back pain  radiating to LEFT hip. Progressive worsening. EXAM: MRI LUMBAR SPINE WITHOUT AND WITH CONTRAST TECHNIQUE: Multiplanar and multiecho pulse sequences of the lumbar spine were obtained without and with intravenous contrast. CONTRAST:  20m MULTIHANCE GADOBENATE DIMEGLUMINE 529 MG/ML IV SOLN COMPARISON:  CT lumbar earlier today. FINDINGS: Segmentation: Transitional anatomy. S1-S2 is well-developed due to partial assimilation of S1 into the sacrum. Alignment:  Anatomic Vertebrae: No worrisome osseous lesion. Congenitally short pedicles. No abnormal postcontrast enhancement. Conus medullaris: Extends to the L1 level and appears normal. Paraspinal and other soft tissues: Negative. Disc levels: L1-L2:  Unremarkable. L2-L3:  Annular bulge.  No impingement. L3-L4:  And bulge.  No impingement. L4-L5: Unremarkable disc space. Posterior element hypertrophy. Short pedicles. Mild stenosis without definite impingement. L5-S1: Unremarkable disc space. Posterior element hypertrophy. Short pedicles. Mild stenosis without definite impingement. S1-S2 transitional interspace.  No impingement. IMPRESSION: No disc pathology of significance. 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 No definite subarticular zone or foraminal zone narrowing is established. Electronically Signed   By: JStaci RighterM.D.   On: 12/13/2016 13:00   Dg Chest Port 1 View  Result Date: 12/13/2016 CLINICAL DATA:  Sepsis. EXAM: PORTABLE CHEST 1 VIEW COMPARISON:  07/20/2014 FINDINGS: The heart size is normal. There are patchy densities in the lung bases bilaterally suspicious for infiltrates. No pulmonary edema. IMPRESSION: Suspect bilateral lower lobe infiltrates. Electronically Signed   By: ENolon NationsM.D.   On: 12/13/2016 22:58   Dg Hip Unilat W Or Wo Pelvis 2-3 Views Left  Result Date: 12/13/2016 CLINICAL DATA:  Mid and lower back pain radiating to the  left hip. EXAM: DG HIP (WITH OR WITHOUT PELVIS) 2-3V LEFT COMPARISON:  None. FINDINGS: Pelvic bony ring is intact. No gross abnormality to the right hip. Left hip is located without a fracture. No significant joint space narrowing in either hip. Gas and stool in the pelvis. IMPRESSION: No acute abnormality in the pelvis or left hip. Electronically Signed   By: AMarkus DaftM.D.   On: 12/13/2016 09:12    Time Spent in minutes 35   DLouellen MolderM.D on 12/15/2016 at 9:41 AM  Between 7am to 7pm - Pager - [phone] After 7pm go to [email or web address] - password [email or web address] -  Office  [phone]

## 2016-12-15 NOTE — Progress Notes (Signed)
PT Cancellation Note  Patient Details Name: Holly Weber MRN: [id number] DOB: Mar 09, 1966   Cancelled Treatment:    Reason Eval/Treat Not Completed: Fatigue/lethargy limiting ability to participate (Pt sleeping and unarousable to her name.  Will continue efforts as time permits. )   Annica Marinello Eli Hose 12/15/2016, 1:30 PM Governor Rooks, PTA pager [phone]

## 2016-12-15 NOTE — Consult Note (Signed)
[facility]    Date of Admission:  12/13/2016           Day 2 vancomycin       Reason for Consult: Automatic consultation for staph aureus bacteremia     Principal Problem:   Staphylococcus aureus bacteremia Active Problems:   Acute low back pain with left-sided sciatica   Pneumonia   Cocaine abuse   IV drug user   Tobacco abuse   HTN (hypertension)   Hyperglycemia   Normocytic anemia   .  ceFAZolin (ANCEF) IV  2 g Intravenous Q8H  . docusate sodium  100 mg Oral BID  . enoxaparin (LOVENOX) injection  40 mg Subcutaneous Q24H  . hydrochlorothiazide  25 mg Oral Daily  . methocarbamol (ROBAXIN)  IV  500 mg Intravenous Q8H  . nicotine  21 mg Transdermal Daily  . sodium chloride flush  3 mL Intravenous Q12H    Recommendations: 1. Continue cefazolin 2. Await results of repeat blood cultures and TEE   Assessment: She has MSSA bacteremia complicated by pneumonia and probable early onset lower vertebral infection. She has a history of penicillin allergy but records document that she has tolerated cephalosporins. I will continue cefazolin. I would hold off on PICC placement pending results of repeat blood cultures and frank discussions with her about her injecting drug use. I agree with TEE. She has HIV antibody negative. I will check a hepatitis C serologies.    HPI: Holly Weber is a 51 y.o. female who had sudden onset of low back and left hip pain about 5 days ago. The pain has become progressively more severe leading to admission 2 days ago. She was febrile on admission. Both admission blood cultures have grown MSSA. Chest x-ray shows bibasilar infiltrates. Plain films of her left hip and CT and MRI of her lower spine did not reveal any acute abnormalities. She has a history of cocaine use and last injected cocaine about 4 weeks ago.  Review of Systems: Review of Systems  Constitutional: Positive for chills, diaphoresis, fever and  malaise/fatigue. Negative for weight loss.  HENT: Negative for sore throat.   Respiratory: Positive for cough and sputum production. Negative for shortness of breath.   Cardiovascular: Negative for chest pain.  Gastrointestinal: Negative for abdominal pain, diarrhea, heartburn, nausea and vomiting.  Genitourinary: Negative for dysuria and frequency.  Musculoskeletal: Positive for back pain and joint pain. Negative for myalgias.  Skin: Negative for rash.  Neurological: Negative for dizziness and headaches.  Psychiatric/Behavioral: Positive for substance abuse. Negative for depression. The patient is not nervous/anxious.     Past Medical History:  Diagnosis Date  . Colitis, ulcerative ([place])   . Depression   . Hepatitis C   . Hypertension   . Stroke ([place])   . Thyroid disease     Social History  Substance Use Topics  . Smoking status: Current Every Day Smoker    Types: Cigarettes  . Smokeless tobacco: Never Used  . Alcohol use No    History reviewed. No pertinent family history. Allergies  Allergen Reactions  . Asa [Aspirin] Other (See Comments)    Ulcerative colitis flares.  . Nsaids Other (See Comments)    Ulcerative colitis flares  . Penicillins Rash and Other (See Comments)    Has patient had a PCN reaction causing immediate rash, facial/tongue/throat swelling, SOB or lightheadedness with hypotension:unsure Has patient had a PCN reaction causing severe rash involving mucus  membranes or skin necrosis:unsure Has patient had a PCN reaction that required hospitalization:No Has patient had a PCN reaction occurring within the last 10 years:Yes If all of the above answers are "NO", then may proceed with Cephalosporin use.     OBJECTIVE: Blood pressure 112/60, pulse 95, temperature 99.3 F (37.4 C), resp. rate 18, height 5' 5"  (1.651 m), weight 160 lb 1.6 oz (72.6 kg), SpO2 100 %.  Physical Exam  Constitutional: She is oriented to person, place, and time.  She is tearful  due to her pain.  HENT:  Mouth/Throat: No oropharyngeal exudate.  Cardiovascular: Normal rate and regular rhythm.   No murmur heard. Pulmonary/Chest: Effort normal and breath sounds normal. She has no wheezes. She has no rales.  Abdominal: Soft. There is no tenderness.  Musculoskeletal: Normal range of motion. She exhibits no edema or tenderness.  Neurological: She is alert and oriented to person, place, and time.  Skin: No rash noted.  Psychiatric: Mood and affect normal.    Lab Results Lab Results  Component Value Date   WBC 12.4 (H) 12/14/2016   HGB 11.7 (L) 12/14/2016   HCT 35.7 (L) 12/14/2016   MCV 79.9 12/14/2016   PLT 154 12/14/2016    Lab Results  Component Value Date   CREATININE 0.68 12/15/2016   BUN 8 12/15/2016   NA 131 (L) 12/15/2016   K 3.5 12/15/2016   CL 96 (L) 12/15/2016   CO2 27 12/15/2016    Lab Results  Component Value Date   ALT 16 12/13/2016   AST 19 12/13/2016   ALKPHOS 110 12/13/2016   BILITOT 1.6 (H) 12/13/2016     Microbiology: Recent Results (from the past 240 hour(s))  Urine culture     Status: Abnormal   Collection Time: 12/13/16  8:34 AM  Result Value Ref Range Status   Specimen Description URINE, CLEAN CATCH  Final   Special Requests NONE  Final   Culture <10,000 COLONIES/mL INSIGNIFICANT GROWTH (A)  Final   Report Status 12/14/2016 FINAL  Final  Culture, blood (x 2)     Status: Abnormal (Preliminary result)   Collection Time: 12/13/16 11:22 PM  Result Value Ref Range Status   Specimen Description BLOOD LEFT ANTECUBITAL  Final   Special Requests BOTTLES DRAWN AEROBIC AND ANAEROBIC 5CC  Final   Culture  Setup Time   Final    GRAM POSITIVE COCCI IN CLUSTERS IN BOTH AEROBIC AND ANAEROBIC BOTTLES CRITICAL RESULT CALLED TO, READ BACK BY AND VERIFIED WITH: Hughie Closs, PHARM, 12/14/16 AT 39 BY J FUDESCO    Culture (A)  Final    STAPHYLOCOCCUS AUREUS SUSCEPTIBILITIES TO FOLLOW    Report Status PENDING  Incomplete  Culture, blood (x  2)     Status: Abnormal (Preliminary result)   Collection Time: 12/13/16 11:22 PM  Result Value Ref Range Status   Specimen Description BLOOD LEFT ANTECUBITAL  Final   Special Requests BOTTLES DRAWN AEROBIC AND ANAEROBIC 5CC  Final   Culture  Setup Time   Final    GRAM POSITIVE COCCI IN CLUSTERS IN BOTH AEROBIC AND ANAEROBIC BOTTLES CRITICAL VALUE NOTED.  VALUE IS CONSISTENT WITH PREVIOUSLY REPORTED AND CALLED VALUE.    Culture STAPHYLOCOCCUS AUREUS (A)  Final   Report Status PENDING  Incomplete  Blood Culture ID Panel (Reflexed)     Status: Abnormal   Collection Time: 12/13/16 11:22 PM  Result Value Ref Range Status   Enterococcus species NOT DETECTED NOT DETECTED Final   Listeria monocytogenes NOT  DETECTED NOT DETECTED Final   Staphylococcus species DETECTED (A) NOT DETECTED Final   Staphylococcus aureus DETECTED (A) NOT DETECTED Final    Comment: Methicillin (oxacillin) susceptible Staphylococcus aureus (MSSA). Preferred therapy is anti staphylococcal beta lactam antibiotic (Cefazolin or Nafcillin), unless clinically contraindicated. CRITICAL RESULT CALLED TO, READ BACK BY AND VERIFIED WITH: Hughie Closs, PHARM, 12/14/16 AT 1711 BY J FUDESCO    Methicillin resistance NOT DETECTED NOT DETECTED Final   Streptococcus species NOT DETECTED NOT DETECTED Final   Streptococcus agalactiae NOT DETECTED NOT DETECTED Final   Streptococcus pneumoniae NOT DETECTED NOT DETECTED Final   Streptococcus pyogenes NOT DETECTED NOT DETECTED Final   Acinetobacter baumannii NOT DETECTED NOT DETECTED Final   Enterobacteriaceae species NOT DETECTED NOT DETECTED Final   Enterobacter cloacae complex NOT DETECTED NOT DETECTED Final   Escherichia coli NOT DETECTED NOT DETECTED Final   Klebsiella oxytoca NOT DETECTED NOT DETECTED Final   Klebsiella pneumoniae NOT DETECTED NOT DETECTED Final   Proteus species NOT DETECTED NOT DETECTED Final   Serratia marcescens NOT DETECTED NOT DETECTED Final   Haemophilus  influenzae NOT DETECTED NOT DETECTED Final   Neisseria meningitidis NOT DETECTED NOT DETECTED Final   Pseudomonas aeruginosa NOT DETECTED NOT DETECTED Final   Candida albicans NOT DETECTED NOT DETECTED Final   Candida glabrata NOT DETECTED NOT DETECTED Final   Candida krusei NOT DETECTED NOT DETECTED Final   Candida parapsilosis NOT DETECTED NOT DETECTED Final   Candida tropicalis NOT DETECTED NOT DETECTED Final  Culture, blood (routine x 2)     Status: None (Preliminary result)   Collection Time: 12/15/16  9:48 AM  Result Value Ref Range Status   Specimen Description BLOOD BLOOD RIGHT HAND  Final   Special Requests IN PEDIATRIC BOTTLE 2.5CC  Final   Culture NO GROWTH < 12 HOURS  Final   Report Status PENDING  Incomplete  Culture, blood (routine x 2)     Status: None (Preliminary result)   Collection Time: 12/15/16  9:48 AM  Result Value Ref Range Status   Specimen Description BLOOD BLOOD LEFT HAND  Final   Special Requests IN PEDIATRIC BOTTLE [place]  Final   Culture NO GROWTH < 12 HOURS  Final   Report Status PENDING  Incomplete    Michel Bickers, MD [facility] [phone] pager   [phone] cell 12/15/2016, 4:16 PM

## 2016-12-16 DIAGNOSIS — M4647 Discitis, unspecified, lumbosacral region: Secondary | ICD-10-CM

## 2016-12-16 DIAGNOSIS — F1910 Other psychoactive substance abuse, uncomplicated: Secondary | ICD-10-CM

## 2016-12-16 DIAGNOSIS — R7881 Bacteremia: Secondary | ICD-10-CM

## 2016-12-16 DIAGNOSIS — Z6826 Body mass index (BMI) 26.0-26.9, adult: Secondary | ICD-10-CM

## 2016-12-16 LAB — CULTURE, BLOOD (ROUTINE X 2)

## 2016-12-16 LAB — BLOOD CULTURE ID PANEL (REFLEXED)

## 2016-12-16 LAB — HEMOGLOBIN A1C
HEMOGLOBIN A1C: 5.8 % — AB (ref 4.8–5.6)
Mean Plasma Glucose: 120 mg/dL

## 2016-12-16 NOTE — Progress Notes (Signed)
Patient ID: Holly Weber, female   DOB: 1965-11-05, 51 y.o.   MRN: [id number]          [facility] Disease  Date of Admission:  12/13/2016   Total days of antibiotics 3         Principal Problem:   Staphylococcus aureus bacteremia Active Problems:   Acute low back pain with left-sided sciatica   Pneumonia   Cocaine abuse   IV drug user   Tobacco abuse   HTN (hypertension)   Hyperglycemia   Normocytic anemia   .  ceFAZolin (ANCEF) IV  2 g Intravenous Q8H  . docusate sodium  100 mg Oral BID  . enoxaparin (LOVENOX) injection  40 mg Subcutaneous Q24H  . hydrochlorothiazide  25 mg Oral Daily  . methocarbamol (ROBAXIN)  IV  500 mg Intravenous Q8H  . nicotine  21 mg Transdermal Daily  . sodium chloride flush  3 mL Intravenous Q12H    SUBJECTIVE: She is still having severe low back and left hip pain that is unchanged.  Review of Systems: Review of Systems  Constitutional: Negative for chills, diaphoresis and fever.  Respiratory: Positive for cough, sputum production and shortness of breath.   Cardiovascular: Negative for chest pain.  Gastrointestinal: Negative for abdominal pain, diarrhea, nausea and vomiting.  Musculoskeletal: Positive for back pain and joint pain.  Psychiatric/Behavioral: Positive for substance abuse.    Past Medical History:  Diagnosis Date  . Colitis, ulcerative ([place])   . Depression   . Hepatitis C   . Hypertension   . Stroke ([place])   . Thyroid disease     Social History  Substance Use Topics  . Smoking status: Current Every Day Smoker    Types: Cigarettes  . Smokeless tobacco: Never Used  . Alcohol use No    History reviewed. No pertinent family history. Allergies  Allergen Reactions  . Asa [Aspirin] Other (See Comments)    Ulcerative colitis flares.  . Nsaids Other (See Comments)    Ulcerative colitis flares  . Penicillins Rash and Other (See Comments)    Has patient had a PCN reaction causing immediate rash,  facial/tongue/throat swelling, SOB or lightheadedness with hypotension:unsure Has patient had a PCN reaction causing severe rash involving mucus membranes or skin necrosis:unsure Has patient had a PCN reaction that required hospitalization:No Has patient had a PCN reaction occurring within the last 10 years:Yes If all of the above answers are "NO", then may proceed with Cephalosporin use.     OBJECTIVE: Vitals:   12/14/16 2148 12/15/16 0542 12/15/16 2147 12/16/16 0640  BP:  112/60 138/69 (!) 157/74  Pulse:  95 (!) 105 (!) 105  Resp:  18 18 18   Temp: 98.9 F (37.2 C) 99.3 F (37.4 C) 98.6 F (37 C) 97.5 F (36.4 C)  TempSrc: Axillary   Oral  SpO2:  100% 98% 94%  Weight:      Height:       Body mass index is 26.64 kg/m.  Physical Exam  Constitutional:  She is tearful.  Cardiovascular: Normal rate and regular rhythm.   No murmur heard. Pulmonary/Chest: Breath sounds normal.    Lab Results Lab Results  Component Value Date   WBC 12.4 (H) 12/14/2016   HGB 11.7 (L) 12/14/2016   HCT 35.7 (L) 12/14/2016   MCV 79.9 12/14/2016   PLT 154 12/14/2016    Lab Results  Component Value Date   CREATININE 0.68 12/15/2016   BUN 8 12/15/2016   NA  131 (L) 12/15/2016   K 3.5 12/15/2016   CL 96 (L) 12/15/2016   CO2 27 12/15/2016    Lab Results  Component Value Date   ALT 16 12/13/2016   AST 19 12/13/2016   ALKPHOS 110 12/13/2016   BILITOT 1.6 (H) 12/13/2016     Microbiology: Recent Results (from the past 240 hour(s))  Urine culture     Status: Abnormal   Collection Time: 12/13/16  8:34 AM  Result Value Ref Range Status   Specimen Description URINE, CLEAN CATCH  Final   Special Requests NONE  Final   Culture <10,000 COLONIES/mL INSIGNIFICANT GROWTH (A)  Final   Report Status 12/14/2016 FINAL  Final  Culture, blood (x 2)     Status: Abnormal   Collection Time: 12/13/16 11:22 PM  Result Value Ref Range Status   Specimen Description BLOOD LEFT ANTECUBITAL  Final    Special Requests BOTTLES DRAWN AEROBIC AND ANAEROBIC 5CC  Final   Culture  Setup Time   Final    GRAM POSITIVE COCCI IN CLUSTERS IN BOTH AEROBIC AND ANAEROBIC BOTTLES CRITICAL RESULT CALLED TO, READ BACK BY AND VERIFIED WITH: Hughie Closs, [place], 12/14/16 AT 16 BY J FUDESCO    Culture STAPHYLOCOCCUS AUREUS (A)  Final   Report Status 12/16/2016 FINAL  Final   Organism ID, Bacteria STAPHYLOCOCCUS AUREUS  Final      Susceptibility   Staphylococcus aureus - MIC*    CIPROFLOXACIN 2 INTERMEDIATE Intermediate     ERYTHROMYCIN RESISTANT Resistant     GENTAMICIN <=0.5 SENSITIVE Sensitive     OXACILLIN 0.5 SENSITIVE Sensitive     TETRACYCLINE <=1 SENSITIVE Sensitive     VANCOMYCIN <=0.5 SENSITIVE Sensitive     TRIMETH/SULFA <=10 SENSITIVE Sensitive     CLINDAMYCIN RESISTANT Resistant     RIFAMPIN <=0.5 SENSITIVE Sensitive     Inducible Clindamycin POSITIVE Resistant     * STAPHYLOCOCCUS AUREUS  Culture, blood (x 2)     Status: Abnormal   Collection Time: 12/13/16 11:22 PM  Result Value Ref Range Status   Specimen Description BLOOD LEFT ANTECUBITAL  Final   Special Requests BOTTLES DRAWN AEROBIC AND ANAEROBIC 5CC  Final   Culture  Setup Time   Final    GRAM POSITIVE COCCI IN CLUSTERS IN BOTH AEROBIC AND ANAEROBIC BOTTLES CRITICAL VALUE NOTED.  VALUE IS CONSISTENT WITH PREVIOUSLY REPORTED AND CALLED VALUE.    Culture (A)  Final    STAPHYLOCOCCUS AUREUS SUSCEPTIBILITIES PERFORMED ON PREVIOUS CULTURE WITHIN THE LAST 5 DAYS.    Report Status 12/16/2016 FINAL  Final  Blood Culture ID Panel (Reflexed)     Status: Abnormal   Collection Time: 12/13/16 11:22 PM  Result Value Ref Range Status   Enterococcus species NOT DETECTED NOT DETECTED Final   Listeria monocytogenes NOT DETECTED NOT DETECTED Final   Staphylococcus species DETECTED (A) NOT DETECTED Final   Staphylococcus aureus DETECTED (A) NOT DETECTED Final    Comment: Methicillin (oxacillin) susceptible Staphylococcus aureus (MSSA).  Preferred therapy is anti staphylococcal beta lactam antibiotic (Cefazolin or Nafcillin), unless clinically contraindicated. CRITICAL RESULT CALLED TO, READ BACK BY AND VERIFIED WITH: Hughie Closs, PHARM, 12/14/16 AT 1711 BY J FUDESCO    Methicillin resistance NOT DETECTED NOT DETECTED Final   Streptococcus species NOT DETECTED NOT DETECTED Final   Streptococcus agalactiae NOT DETECTED NOT DETECTED Final   Streptococcus pneumoniae NOT DETECTED NOT DETECTED Final   Streptococcus pyogenes NOT DETECTED NOT DETECTED Final   Acinetobacter baumannii NOT DETECTED NOT DETECTED Final  Enterobacteriaceae species NOT DETECTED NOT DETECTED Final   Enterobacter cloacae complex NOT DETECTED NOT DETECTED Final   Escherichia coli NOT DETECTED NOT DETECTED Final   Klebsiella oxytoca NOT DETECTED NOT DETECTED Final   Klebsiella pneumoniae NOT DETECTED NOT DETECTED Final   Proteus species NOT DETECTED NOT DETECTED Final   Serratia marcescens NOT DETECTED NOT DETECTED Final   Haemophilus influenzae NOT DETECTED NOT DETECTED Final   Neisseria meningitidis NOT DETECTED NOT DETECTED Final   Pseudomonas aeruginosa NOT DETECTED NOT DETECTED Final   Candida albicans NOT DETECTED NOT DETECTED Final   Candida glabrata NOT DETECTED NOT DETECTED Final   Candida krusei NOT DETECTED NOT DETECTED Final   Candida parapsilosis NOT DETECTED NOT DETECTED Final   Candida tropicalis NOT DETECTED NOT DETECTED Final  Culture, blood (routine x 2)     Status: None (Preliminary result)   Collection Time: 12/15/16  9:48 AM  Result Value Ref Range Status   Specimen Description BLOOD BLOOD RIGHT HAND  Final   Special Requests IN PEDIATRIC BOTTLE 2.5CC  Final   Culture  Setup Time   Final    GRAM POSITIVE COCCI IN CLUSTERS IN PEDIATRIC BOTTLE CRITICAL VALUE NOTED.  VALUE IS CONSISTENT WITH PREVIOUSLY REPORTED AND CALLED VALUE.    Culture NO GROWTH < 12 HOURS  Final   Report Status PENDING  Incomplete  Culture, blood (routine x 2)      Status: None (Preliminary result)   Collection Time: 12/15/16  9:48 AM  Result Value Ref Range Status   Specimen Description BLOOD BLOOD LEFT HAND  Final   Special Requests IN PEDIATRIC BOTTLE 1CC  Final   Culture NO GROWTH < 12 HOURS  Final   Report Status PENDING  Incomplete     ASSESSMENT: She has MSSA bacteremia, pneumonia and probable early lumbar infection that was missed on MRI. One of 2 repeat blood cultures obtained yesterday is growing gram-positive cocci in clusters. I will repeat blood cultures today. I will continue cefazolin. Long-term management of this complicated infection will be difficult because of her history of recent injecting drug use.  PLAN: 1. Continue cefazolin 2. Repeat blood cultures  Michel Bickers, MD [facility] for Infectious Disease [place] [phone] pager   [phone] cell 12/16/2016, 12:53 PM

## 2016-12-16 NOTE — Progress Notes (Signed)
    [facility] has been requested to perform a transesophageal echocardiogram on Holly Weber for bacteremia.  After careful review of history and examination, the risks and benefits of transesophageal echocardiogram have been explained including risks of esophageal damage, perforation (1:10,000 risk), bleeding, pharyngeal hematoma as well as other potential complications associated with conscious sedation including aspiration, arrhythmia, respiratory failure and death. Alternatives to treatment were discussed, questions were answered. Patient is willing to proceed.   Pt is scheduled for tomorrow at 11:00AM  Ledora Bottcher, [place]  12/16/2016 3:03 PM

## 2016-12-16 NOTE — Progress Notes (Signed)
PROGRESS NOTE                                                                                                                                                                                                             Patient Demographics:    Holly Weber, is a 51 y.o. female, DOB - 07-09-1966, BOF:[id number]  Admit date - 12/13/2016   Admitting Physician Maren Reamer, MD  Outpatient Primary MD for the patient is No PCP Per Patient  LOS - 2  Outpatient Specialists:none  Chief Complaint  Patient presents with  . Back Pain       Brief Narrative   51 year old female with ulcerative colitis, hep C, depression, history of stroke, cocaine abuse (last use was about 4 weeks ago. IV), ongoing tobacco use presented with progressive worsening lower back pain radiating down to the left leg with some numbness since 2 days prior to admission. Denied any bowel or urinary incontinence. Patient reports having significant difficulty to move around or amblyopia. Denies fevers, chills, vomiting, chest pain, palpitations, loss of consciousness, shortness of breath or abdominal pain. In the ED she was septic with fever of 101.4F, tachycardic, tachypneic and WBC of 16.5 K. CT and MRI of the lumbar spine was unremarkable for stenosis, discitis or abscess. Chest x-ray concerning for bilateral infiltrate. Blood cx from admission growing MSSA in both bottles. Admitted to hospitalist service for sepsis.   Subjective:   To having a lot of pain in her left lower back radiating down to the legs.   Assessment  & Plan :   Principle problem: Sepsis ([place]) Secondary to MSSA bacteremia. 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 Chest x-ray also shows bilateral infiltrates (? Septic pneumonitis) On empiric IV vancomycin . Repeat blood cx so far no growth. Cardiology to schedule TEE tomorrow. ID consult following.  back pain with  left-sided sciatica. CT and MRI of the lumbar spine without disc involvement, epidural abscess, spinal stenosis or nerve impingement. This appears to be early discitis which was not picked up on imaging. Discussed with ID, recommends to hold off on repeat imaging to see for discitis since it may again yield a false negative result. We could repeat an MRI of the lumbar spine in next 48 hours depending upon clinical course. (However would not change the management)   Pain  control with hydrocodone and Toradol when necessary. Continue scheduled Robaxin and IV morphine when necessary for severe pain. PT  Recommends HH.Marland Kitchen  Polysubstance use Reports frequent cocaine use (last use was 1 month ago). Reduction positive for cocaine and THC.  Hypokalemia/hypomagnesemia Replenished  Hyponatremia Received IV fluids. Recheck labs in the morning   Uncontrolled hypertension  Not on medication previously. Possibly triggered by pain. Started on HCTZ.  Tobacco abuse  counseled on cessation. Nicotine patch.   Code Status : full code  Family Communication  : none at bedside  Disposition Plan  : home once better  Barriers For Discharge : active symptoms  Consults  :  ID Cards for TEE  Procedures  :  CT LS spine MRI LS spine  DVT Prophylaxis  :  Lovenox   Lab Results  Component Value Date   PLT 154 12/14/2016    Antibiotics  :   Anti-infectives    Start     Dose/Rate Route Frequency Ordered Stop   12/15/16 1800  ceFAZolin (ANCEF) IVPB 2g/100 mL premix     2 g 200 mL/hr over 30 Minutes Intravenous Every 8 hours 12/15/16 1539     12/15/16 0000  levofloxacin (LEVAQUIN) IVPB 750 mg  Status:  Discontinued     750 mg 100 mL/hr over 90 Minutes Intravenous Every 24 hours 12/13/16 2243 12/14/16 1316   12/14/16 0800  vancomycin (VANCOCIN) IVPB 750 mg/150 ml premix  Status:  Discontinued     750 mg 150 mL/hr over 60 Minutes Intravenous Every 8 hours 12/13/16 2243 12/15/16 1539   12/14/16  0600  aztreonam (AZACTAM) 2 g in dextrose 5 % 50 mL IVPB  Status:  Discontinued     2 g 100 mL/hr over 30 Minutes Intravenous Every 8 hours 12/13/16 2243 12/14/16 1729   12/13/16 2245  levofloxacin (LEVAQUIN) IVPB 750 mg     750 mg 100 mL/hr over 90 Minutes Intravenous  Once 12/13/16 2232 12/14/16 0138   12/13/16 2245  aztreonam (AZACTAM) 2 g in dextrose 5 % 50 mL IVPB     2 g 100 mL/hr over 30 Minutes Intravenous  Once 12/13/16 2232 12/14/16 0104   12/13/16 2245  vancomycin (VANCOCIN) IVPB 1000 mg/200 mL premix     1,000 mg 200 mL/hr over 60 Minutes Intravenous  Once 12/13/16 2232 12/14/16 0133        Objective:   Vitals:   12/14/16 2148 12/15/16 0542 12/15/16 2147 12/16/16 0640  BP:  112/60 138/69 (!) 157/74  Pulse:  95 (!) 105 (!) 105  Resp:  18 18 18   Temp: 98.9 F (37.2 C) 99.3 F (37.4 C) 98.6 F (37 C) 97.5 F (36.4 C)  TempSrc: Axillary   Oral  SpO2:  100% 98% 94%  Weight:      Height:        Wt Readings from Last 3 Encounters:  12/13/16 72.6 kg (160 lb 1.6 oz)  04/03/16 86.2 kg (190 lb)  04/03/16 86.2 kg (190 lb)     Intake/Output Summary (Last 24 hours) at 12/16/16 1049 Last data filed at 12/16/16 0917  Gross per 24 hour  Intake          1381.67 ml  Output             2870 ml  Net         -1488.33 ml     Physical Exam  Gen: In some distress with pain  HEENT: moist mucosa, supple neck Chest: clear b/l,  no added sounds CVS: N S1&S2, no murmurs,  GI: soft, NT, ND,  Musculoskeletal: On, no edema, limited mobility of the left hip due to pain     Data Review:    CBC  Recent Labs Lab 12/13/16 0825 12/13/16 1718 12/14/16 0117  WBC 14.2* 16.5* 12.4*  HGB 13.0 14.0 11.7*  HCT 38.8 41.8 35.7*  PLT 167 164 154  MCV 79.2 79.9 79.9  MCH 26.5 26.8 26.2  MCHC 33.5 33.5 32.8  RDW 13.6 13.7 13.7  LYMPHSABS 1.4  --  1.4  MONOABS 1.3*  --  1.0  EOSABS 0.1  --  0.0  BASOSABS 0.0  --  0.0    Chemistries   Recent Labs Lab 12/13/16 0825  12/13/16 1718 12/13/16 2322 12/15/16 0529  NA 134*  --  130* 131*  K 3.4*  --  3.3* 3.5  CL 101  --  93* 96*  CO2 23  --  24 27  GLUCOSE 147*  --  182* 119*  BUN 9  --  7 8  CREATININE 0.52 0.59 0.75 0.68  CALCIUM 8.8*  --  8.8* 8.5*  MG  --  1.6*  --   --   AST  --   --  19  --   ALT  --   --  16  --   ALKPHOS  --   --  110  --   BILITOT  --   --  1.6*  --    ------------------------------------------------------------------------------------------------------------------ No results for input(s): CHOL, HDL, LDLCALC, TRIG, CHOLHDL, LDLDIRECT in the last 72 hours.  Lab Results  Component Value Date   HGBA1C 5.8 (H) 12/13/2016   ------------------------------------------------------------------------------------------------------------------  Recent Labs  12/13/16 1718  TSH 1.230   ------------------------------------------------------------------------------------------------------------------ No results for input(s): VITAMINB12, FOLATE, FERRITIN, TIBC, IRON, RETICCTPCT in the last 72 hours.  Coagulation profile  Recent Labs Lab 12/13/16 0825 12/13/16 2322  INR 1.07 1.14    No results for input(s): DDIMER in the last 72 hours.  Cardiac Enzymes No results for input(s): CKMB, TROPONINI, MYOGLOBIN in the last 168 hours.  Invalid input(s): CK ------------------------------------------------------------------------------------------------------------------ No results found for: BNP  Inpatient Medications  Scheduled Meds: .  ceFAZolin (ANCEF) IV  2 g Intravenous Q8H  . docusate sodium  100 mg Oral BID  . enoxaparin (LOVENOX) injection  40 mg Subcutaneous Q24H  . hydrochlorothiazide  25 mg Oral Daily  . methocarbamol (ROBAXIN)  IV  500 mg Intravenous Q8H  . nicotine  21 mg Transdermal Daily  . sodium chloride flush  3 mL Intravenous Q12H   Continuous Infusions: . 0.9 % NaCl with KCl 20 mEq / L 50 mL/hr at 12/15/16 1749   PRN Meds:.acetaminophen **OR**  acetaminophen, bisacodyl, HYDROcodone-acetaminophen, ketorolac, morphine injection, ondansetron **OR** ondansetron (ZOFRAN) IV, senna-docusate  Micro Results Recent Results (from the past 240 hour(s))  Urine culture     Status: Abnormal   Collection Time: 12/13/16  8:34 AM  Result Value Ref Range Status   Specimen Description URINE, CLEAN CATCH  Final   Special Requests NONE  Final   Culture <10,000 COLONIES/mL INSIGNIFICANT GROWTH (A)  Final   Report Status 12/14/2016 FINAL  Final  Culture, blood (x 2)     Status: Abnormal   Collection Time: 12/13/16 11:22 PM  Result Value Ref Range Status   Specimen Description BLOOD LEFT ANTECUBITAL  Final   Special Requests BOTTLES DRAWN AEROBIC AND ANAEROBIC 5CC  Final   Culture  Setup Time   Final  GRAM POSITIVE COCCI IN CLUSTERS IN BOTH AEROBIC AND ANAEROBIC BOTTLES CRITICAL RESULT CALLED TO, READ BACK BY AND VERIFIED WITH: Hughie Closs, [place], 12/14/16 AT 31 BY J FUDESCO    Culture STAPHYLOCOCCUS AUREUS (A)  Final   Report Status 12/16/2016 FINAL  Final   Organism ID, Bacteria STAPHYLOCOCCUS AUREUS  Final      Susceptibility   Staphylococcus aureus - MIC*    CIPROFLOXACIN 2 INTERMEDIATE Intermediate     ERYTHROMYCIN RESISTANT Resistant     GENTAMICIN <=0.5 SENSITIVE Sensitive     OXACILLIN 0.5 SENSITIVE Sensitive     TETRACYCLINE <=1 SENSITIVE Sensitive     VANCOMYCIN <=0.5 SENSITIVE Sensitive     TRIMETH/SULFA <=10 SENSITIVE Sensitive     CLINDAMYCIN RESISTANT Resistant     RIFAMPIN <=0.5 SENSITIVE Sensitive     Inducible Clindamycin POSITIVE Resistant     * STAPHYLOCOCCUS AUREUS  Culture, blood (x 2)     Status: Abnormal   Collection Time: 12/13/16 11:22 PM  Result Value Ref Range Status   Specimen Description BLOOD LEFT ANTECUBITAL  Final   Special Requests BOTTLES DRAWN AEROBIC AND ANAEROBIC 5CC  Final   Culture  Setup Time   Final    GRAM POSITIVE COCCI IN CLUSTERS IN BOTH AEROBIC AND ANAEROBIC BOTTLES CRITICAL VALUE NOTED.   VALUE IS CONSISTENT WITH PREVIOUSLY REPORTED AND CALLED VALUE.    Culture (A)  Final    STAPHYLOCOCCUS AUREUS SUSCEPTIBILITIES PERFORMED ON PREVIOUS CULTURE WITHIN THE LAST 5 DAYS.    Report Status 12/16/2016 FINAL  Final  Blood Culture ID Panel (Reflexed)     Status: Abnormal   Collection Time: 12/13/16 11:22 PM  Result Value Ref Range Status   Enterococcus species NOT DETECTED NOT DETECTED Final   Listeria monocytogenes NOT DETECTED NOT DETECTED Final   Staphylococcus species DETECTED (A) NOT DETECTED Final   Staphylococcus aureus DETECTED (A) NOT DETECTED Final    Comment: Methicillin (oxacillin) susceptible Staphylococcus aureus (MSSA). Preferred therapy is anti staphylococcal beta lactam antibiotic (Cefazolin or Nafcillin), unless clinically contraindicated. CRITICAL RESULT CALLED TO, READ BACK BY AND VERIFIED WITH: Hughie Closs, [facility], 12/14/16 AT 1711 BY J FUDESCO    Methicillin resistance NOT DETECTED NOT DETECTED Final   Streptococcus species NOT DETECTED NOT DETECTED Final   Streptococcus agalactiae NOT DETECTED NOT DETECTED Final   Streptococcus pneumoniae NOT DETECTED NOT DETECTED Final   Streptococcus pyogenes NOT DETECTED NOT DETECTED Final   Acinetobacter baumannii NOT DETECTED NOT DETECTED Final   Enterobacteriaceae species NOT DETECTED NOT DETECTED Final   Enterobacter cloacae complex NOT DETECTED NOT DETECTED Final   Escherichia coli NOT DETECTED NOT DETECTED Final   Klebsiella oxytoca NOT DETECTED NOT DETECTED Final   Klebsiella pneumoniae NOT DETECTED NOT DETECTED Final   Proteus species NOT DETECTED NOT DETECTED Final   Serratia marcescens NOT DETECTED NOT DETECTED Final   Haemophilus influenzae NOT DETECTED NOT DETECTED Final   Neisseria meningitidis NOT DETECTED NOT DETECTED Final   Pseudomonas aeruginosa NOT DETECTED NOT DETECTED Final   Candida albicans NOT DETECTED NOT DETECTED Final   Candida glabrata NOT DETECTED NOT DETECTED Final   Candida krusei NOT  DETECTED NOT DETECTED Final   Candida parapsilosis NOT DETECTED NOT DETECTED Final   Candida tropicalis NOT DETECTED NOT DETECTED Final  Culture, blood (routine x 2)     Status: None (Preliminary result)   Collection Time: 12/15/16  9:48 AM  Result Value Ref Range Status   Specimen Description BLOOD BLOOD RIGHT HAND  Final  Special Requests IN PEDIATRIC BOTTLE 2.5CC  Final   Culture  Setup Time   Final    GRAM POSITIVE COCCI IN CLUSTERS IN PEDIATRIC BOTTLE CRITICAL VALUE NOTED.  VALUE IS CONSISTENT WITH PREVIOUSLY REPORTED AND CALLED VALUE.    Culture NO GROWTH < 12 HOURS  Final   Report Status PENDING  Incomplete  Culture, blood (routine x 2)     Status: None (Preliminary result)   Collection Time: 12/15/16  9:48 AM  Result Value Ref Range Status   Specimen Description BLOOD BLOOD LEFT HAND  Final   Special Requests IN PEDIATRIC BOTTLE 1CC  Final   Culture NO GROWTH < 12 HOURS  Final   Report Status PENDING  Incomplete    Radiology Reports Ct Lumbar Spine Wo Contrast  Result Date: 12/13/2016 CLINICAL DATA:  Lumbago with left-sided radicular symptoms EXAM: CT LUMBAR SPINE WITHOUT CONTRAST TECHNIQUE: Multidetector CT imaging of the lumbar spine was performed without intravenous contrast administration. Multiplanar CT image reconstructions were also generated. COMPARISON:  None. FINDINGS: Segmentation: There are 5 non-rib-bearing lumbar type vertebral bodies. 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 There is a well-defined S1-S2 interspace. Alignment: There is no spondylolisthesis. Vertebrae: No fracture.  No blastic or lytic bone lesions. Paraspinal and other soft tissues: No paraspinous lesions are evident. There are foci of calcification in the abdominal aorta and common iliac artery without demonstrable abdominal aortic aneurysm. Disc levels: At T12-L1, there is no nerve root edema or effacement. No disc extrusion or stenosis. At L1-2, there is mild  facet hypertrophy bilaterally. There is slight diffuse disc bulging. No nerve root edema or effacement. No disc extrusion or stenosis. At L2-3, there is mild facet hypertrophy bilaterally. There is a moderate diffuse disc bulging without nerve root edema or effacement. No disc extrusion or stenosis. At L3-4, there is moderate facet hypertrophy bilaterally. There is mild diffuse disc bulging without nerve root edema or effacement. No disc extrusion or stenosis. At L4-5, there is moderate facet hypertrophy bilaterally. There is broad-based disc bulging. No disc extrusion. There is, however, mild to moderate generalized spinal stenosis at L4-5 due to generalized disc bulging coupled with facet and ligamentum flava hypertrophy bilaterally. At L5-S1, there is moderate facet hypertrophy bilaterally. There is broad-based disc protrusion without nerve root edema or effacement. No frank disc extrusion or stenosis. At S1-2, there is moderate facet hypertrophy bilaterally. No nerve root edema or effacement. No disc extrusion or stenosis. There is osteoarthritic change in the assimilation joint on the right at S1-2. There is mild osteoarthritic change in the sacroiliac joints bilaterally. No sacroiliitis evident. 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 No disc extrusion on this study. Facet hypertrophy at multiple levels. No fracture or spondylolisthesis. 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 Aortoiliac atherosclerosis without demonstrable aneurysm. Electronically Signed   By: Lowella Grip III M.D.   On: 12/13/2016 09:26   Mr Lumbar Spine W Wo Contrast  Result Date: 12/13/2016 CLINICAL DATA:  Mid to low back pain radiating to LEFT hip. Progressive  worsening. EXAM: MRI LUMBAR SPINE WITHOUT AND WITH CONTRAST TECHNIQUE: Multiplanar and multiecho pulse sequences of the lumbar spine were obtained without and with intravenous contrast. CONTRAST:  90m MULTIHANCE GADOBENATE DIMEGLUMINE 529 MG/ML IV SOLN COMPARISON:  CT lumbar earlier  today. FINDINGS: Segmentation: Transitional anatomy. S1-S2 is well-developed due to partial assimilation of S1 into the sacrum. Alignment:  Anatomic Vertebrae: No worrisome osseous lesion. Congenitally short pedicles. No abnormal postcontrast enhancement. Conus medullaris: Extends to the L1 level and appears normal. Paraspinal and other soft tissues: Negative. Disc levels: L1-L2:  Unremarkable. L2-L3:  Annular bulge.  No impingement. L3-L4:  And bulge.  No impingement. L4-L5: Unremarkable disc space. Posterior element hypertrophy. Short pedicles. Mild stenosis without definite impingement. L5-S1: Unremarkable disc space. Posterior element hypertrophy. Short pedicles. Mild stenosis without definite impingement. S1-S2 transitional interspace.  No impingement. IMPRESSION: No disc pathology of significance. 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 No definite subarticular zone or foraminal zone narrowing is established. Electronically Signed   By: Staci Righter M.D.   On: 12/13/2016 13:00   Dg Chest Port 1 View  Result Date: 12/13/2016 CLINICAL DATA:  Sepsis. EXAM: PORTABLE CHEST 1 VIEW COMPARISON:  07/20/2014 FINDINGS: The heart size is normal. There are patchy densities in the lung bases bilaterally suspicious for infiltrates. No pulmonary edema. IMPRESSION: Suspect bilateral lower lobe infiltrates. Electronically Signed   By: Nolon Nations M.D.   On: 12/13/2016 22:58   Dg Hip Unilat W Or Wo Pelvis 2-3 Views Left  Result Date: 12/13/2016 CLINICAL DATA:  Mid and lower back pain radiating to the left hip. EXAM: DG HIP (WITH OR  WITHOUT PELVIS) 2-3V LEFT COMPARISON:  None. FINDINGS: Pelvic bony ring is intact. No gross abnormality to the right hip. Left hip is located without a fracture. No significant joint space narrowing in either hip. Gas and stool in the pelvis. IMPRESSION: No acute abnormality in the pelvis or left hip. Electronically Signed   By: Markus Daft M.D.   On: 12/13/2016 09:12    Time Spent in minutes 25   Louellen Molder M.D on 12/16/2016 at 10:49 AM  Between 7am to 7pm - Pager - [phone]  After 7pm go to [email or web address] - password [facility]  [facility] -  Office  [phone]

## 2016-12-17 ENCOUNTER — Inpatient Hospital Stay (HOSPITAL_COMMUNITY): Payer: Self-pay

## 2016-12-17 ENCOUNTER — Encounter (HOSPITAL_COMMUNITY): Payer: Self-pay | Admitting: *Deleted

## 2016-12-17 ENCOUNTER — Encounter (HOSPITAL_COMMUNITY)
Admission: EM | Discharge status: Against Medical Advice | Payer: Self-pay | Source: Home / Self Care | Attending: Nephrology

## 2016-12-17 ENCOUNTER — Inpatient Hospital Stay (HOSPITAL_COMMUNITY): Payer: Self-pay | Admitting: Anesthesiology

## 2016-12-17 DIAGNOSIS — I10 Essential (primary) hypertension: Secondary | ICD-10-CM

## 2016-12-17 DIAGNOSIS — I340 Nonrheumatic mitral (valve) insufficiency: Secondary | ICD-10-CM

## 2016-12-17 HISTORY — PX: TEE WITHOUT CARDIOVERSION: SHX5443

## 2016-12-17 LAB — BASIC METABOLIC PANEL
Anion gap: 12 (ref 5–15)
BUN: 9 mg/dL (ref 6–20)
CALCIUM: 8.4 mg/dL — AB (ref 8.9–10.3)
CHLORIDE: 92 mmol/L — AB (ref 101–111)
CO2: 27 mmol/L (ref 22–32)
CREATININE: 0.54 mg/dL (ref 0.44–1.00)
GFR calc non Af Amer: >60 mL/min (ref >60)
Glucose, Bld: 115 mg/dL — ABNORMAL HIGH (ref 65–99)
Potassium: 3.7 mmol/L (ref 3.5–5.1)
SODIUM: 131 mmol/L — AB (ref 135–145)

## 2016-12-17 LAB — HEPATITIS C GENOTYPE

## 2016-12-17 LAB — CBC
HCT: 36.7 % (ref 36.0–46.0)
HEMOGLOBIN: 12.3 g/dL (ref 12.0–15.0)
MCH: 26.2 pg (ref 26.0–34.0)
MCHC: 33.5 g/dL (ref 30.0–36.0)
MCV: 78.1 fL (ref 78.0–100.0)
Platelets: 180 10*3/uL (ref 150–400)
RBC: 4.7 MIL/uL (ref 3.87–5.11)
RDW: 13.7 % (ref 11.5–15.5)
WBC: 6.7 10*3/uL (ref 4.0–10.5)

## 2016-12-17 LAB — CULTURE, BLOOD (ROUTINE X 2)

## 2016-12-17 LAB — HCV RNA QUANT RFLX ULTRA OR GENOTYP
HCV RNA Qnt(log copy/mL): 6.888 log10 IU/mL
HepC Qn: 7720000 IU/mL

## 2016-12-17 SURGERY — ECHOCARDIOGRAM, TRANSESOPHAGEAL
Anesthesia: Monitor Anesthesia Care

## 2016-12-17 MED ORDER — HYDRALAZINE HCL 20 MG/ML IJ SOLN
10.0000 mg | Freq: Four times a day (QID) | INTRAMUSCULAR | Status: DC | PRN
Start: 2016-12-17 — End: 2016-12-22
  Administered 2016-12-17: 10 mg via INTRAVENOUS
  Filled 2016-12-17: qty 1

## 2016-12-17 MED ORDER — PROPOFOL 10 MG/ML IV BOLUS
INTRAVENOUS | Status: DC | PRN
  Administered 2016-12-17: 20 mg via INTRAVENOUS
  Administered 2016-12-17: 30 mg via INTRAVENOUS
  Administered 2016-12-17: 20 mg via INTRAVENOUS

## 2016-12-17 MED ORDER — PROPOFOL 500 MG/50ML IV EMUL
INTRAVENOUS | Status: DC | PRN
  Administered 2016-12-17: 75 ug/kg/min via INTRAVENOUS

## 2016-12-17 MED ORDER — AMLODIPINE BESYLATE 5 MG PO TABS: 5.0000 mg | ORAL_TABLET | Freq: Every day | ORAL | Status: DC

## 2016-12-17 MED ORDER — LORAZEPAM 1 MG PO TABS
1.0000 mg | ORAL_TABLET | Freq: Two times a day (BID) | ORAL | Status: DC | PRN
  Administered 2016-12-17 – 2016-12-21 (×11): 1 mg via ORAL
  Filled 2016-12-17 (×11): qty 1

## 2016-12-17 MED ORDER — SODIUM CHLORIDE 0.9 % IV SOLN
INTRAVENOUS | Status: DC
  Administered 2016-12-17: 11:00:00 via INTRAVENOUS

## 2016-12-17 MED ORDER — BUTAMBEN-TETRACAINE-BENZOCAINE 2-2-14 % EX AERO
INHALATION_SPRAY | CUTANEOUS | Status: DC | PRN
  Administered 2016-12-17: 1 via TOPICAL

## 2016-12-17 MED ORDER — METHOCARBAMOL 750 MG PO TABS
750.0000 mg | ORAL_TABLET | Freq: Three times a day (TID) | ORAL | Status: DC
Start: 2016-12-17 — End: 2016-12-18
  Administered 2016-12-17 – 2016-12-18 (×3): 750 mg via ORAL
  Filled 2016-12-17 (×3): qty 1

## 2016-12-17 MED ORDER — LIDOCAINE 2% (20 MG/ML) 5 ML SYRINGE
INTRAMUSCULAR | Status: DC | PRN
  Administered 2016-12-17: 40 mg via INTRAVENOUS

## 2016-12-17 NOTE — Progress Notes (Addendum)
PROGRESS NOTE    Holly Weber  LYY:[id number] DOB: 07-16-1966 DOA: 12/13/2016 PCP: No PCP Per Patient   Brief Narrative: 51 year old female with ulcerative colitis, hep C, depression, history of stroke, cocaine abuse (last use was about 4 weeks ago. IV), ongoing tobacco use presented with progressive worsening lower back pain radiating down to the left leg with some numbness since 2 days prior to admission. Denied any bowel or urinary incontinence. Patient reports having significant difficulty to move around or amblyopia. Denies fevers, chills, vomiting, chest pain, palpitations, loss of consciousness, shortness of breath or abdominal pain. In the ED she was septic with fever of 101.10F, tachycardic, tachypneic and WBC of 16.5 K. CT and MRI of the lumbar spine was unremarkable for stenosis, discitis or abscess. Chest x-ray concerning for bilateral infiltrate. Blood cx from admission growing MSSA in both bottles. Admitted to hospitalist service for sepsis.  Assessment & Plan:  # Sepsis due to MSSA bacteremia:  - TEE with no vegetation. On IV cefazolin. Follow up ID's recommendation. -follow up repeat cultures from 3/27.   # Back pain with left sided sciatica:  -CT/MRI if spine showed no acute findings. ? Early discitis as per prior progress note. Patient was febrile this afternoon and complaining of left hip pain. I will order MRI of lumbar spine and left hip.  -Continue current pain medications. Increased the dose of robaxin.  -on toradol, morphine prn for pain management.   # Polysubstance abuse: Urine toxicology positive for cocaine and THC.  #Hypokalemia, hyponatremia: Electrolyte repleted. Continue to monitor. On IV fluid. Repeat lab in am.  #  Hypertension: discontinue hydrochlorothiazide as patient has hyponatremia and hypokalemia. Will order hydralazine as needed for elevated BP.   # Tobacco abuse: continue nicotine patch.   Principal Problem:   Staphylococcus aureus  bacteremia Active Problems:   Acute low back pain with left-sided sciatica   Cocaine abuse   Tobacco abuse   HTN (hypertension)   Pneumonia   Hyperglycemia   Normocytic anemia   IV drug user  DVT prophylaxis:lovenox sq Code Status:full Family Communication:no family at bedside Disposition Plan:likely dc to SNF vs home depending on clinical improvement and if pt requires IV abx.    Consultants:   ID  Cards for TEE  Procedures:TEE on 3/28 Antimicrobials:cefazolin since 3/26 Vancomycin 3/24-3/26  Subjective: Patient was seen and examined at bedside. She reported no improvement in her left hip pain. Denied chest pain or shortness of breath.  Objective: Vitals:   12/17/16 0511 12/17/16 1021 12/17/16 1115 12/17/16 1125  BP: (!) 142/88 125/80 139/87 134/89  Pulse: 99 94  90  Resp: 16 15 (!) 26 20  Temp: 98.1 F (36.7 C) 98 F (36.7 C) 98 F (36.7 C)   TempSrc: Oral Oral Oral   SpO2: 93% 93% 97% 94%  Weight:      Height:        Intake/Output Summary (Last 24 hours) at 12/17/16 1541 Last data filed at 12/17/16 0817  Gross per 24 hour  Intake                3 ml  Output             1650 ml  Net            -1647 ml   Filed Weights   12/13/16 1826  Weight: 72.6 kg (160 lb 1.6 oz)    Examination:  General exam: Appears calm and comfortable  Respiratory system: Clear to auscultation.  Respiratory effort normal. No wheezing or crackle Cardiovascular system: S1 & S2 heard, RRR.  No pedal edema. Gastrointestinal system: Abdomen is nondistended, soft and nontender. Normal bowel sounds heard. Central nervous system: Alert Awake and following commands Skin: No rashes, lesions or ulcers Psychiatry: Judgement and insight appear normal. Mood & affect appropriate.     Data Reviewed: I have personally reviewed following labs and imaging studies  CBC:  Recent Labs Lab 12/13/16 0825 12/13/16 1718 12/14/16 0117 12/17/16 0647  WBC 14.2* 16.5* 12.4* 6.7  NEUTROABS  11.4*  --  10.0*  --   HGB 13.0 14.0 11.7* 12.3  HCT 38.8 41.8 35.7* 36.7  MCV 79.2 79.9 79.9 78.1  PLT 167 164 154 726   Basic Metabolic Panel:  Recent Labs Lab 12/13/16 0825 12/13/16 1718 12/13/16 2322 12/15/16 0529 12/17/16 0647  NA 134*  --  130* 131* 131*  K 3.4*  --  3.3* 3.5 3.7  CL 101  --  93* 96* 92*  CO2 23  --  24 27 27   GLUCOSE 147*  --  182* 119* 115*  BUN 9  --  7 8 9   CREATININE 0.52 0.59 0.75 0.68 0.54  CALCIUM 8.8*  --  8.8* 8.5* 8.4*  MG  --  1.6*  --   --   --    GFR: Estimated Creatinine Clearance: 84.9 mL/min (by C-G formula based on SCr of 0.54 mg/dL). Liver Function Tests:  Recent Labs Lab 12/13/16 2322  AST 19  ALT 16  ALKPHOS 110  BILITOT 1.6*  PROT 7.2  ALBUMIN 3.0*   No results for input(s): LIPASE, AMYLASE in the last 168 hours. No results for input(s): AMMONIA in the last 168 hours. Coagulation Profile:  Recent Labs Lab 12/13/16 0825 12/13/16 2322  INR 1.07 1.14   Cardiac Enzymes: No results for input(s): CKTOTAL, CKMB, CKMBINDEX, TROPONINI in the last 168 hours. BNP (last 3 results) No results for input(s): PROBNP in the last 8760 hours. HbA1C: No results for input(s): HGBA1C in the last 72 hours. CBG: No results for input(s): GLUCAP in the last 168 hours. Lipid Profile: No results for input(s): CHOL, HDL, LDLCALC, TRIG, CHOLHDL, LDLDIRECT in the last 72 hours. Thyroid Function Tests: No results for input(s): TSH, T4TOTAL, FREET4, T3FREE, THYROIDAB in the last 72 hours. Anemia Panel: No results for input(s): VITAMINB12, FOLATE, FERRITIN, TIBC, IRON, RETICCTPCT in the last 72 hours. Sepsis Labs:  Recent Labs Lab 12/13/16 2322 12/14/16 0117  PROCALCITON 0.15  --   LATICACIDVEN 1.5 0.7    Recent Results (from the past 240 hour(s))  Urine culture     Status: Abnormal   Collection Time: 12/13/16  8:34 AM  Result Value Ref Range Status   Specimen Description URINE, CLEAN CATCH  Final   Special Requests NONE  Final     Culture <10,000 COLONIES/mL INSIGNIFICANT GROWTH (A)  Final   Report Status 12/14/2016 FINAL  Final  Culture, blood (x 2)     Status: Abnormal   Collection Time: 12/13/16 11:22 PM  Result Value Ref Range Status   Specimen Description BLOOD LEFT ANTECUBITAL  Final   Special Requests BOTTLES DRAWN AEROBIC AND ANAEROBIC 5CC  Final   Culture  Setup Time   Final    GRAM POSITIVE COCCI IN CLUSTERS IN BOTH AEROBIC AND ANAEROBIC BOTTLES CRITICAL RESULT CALLED TO, READ BACK BY AND VERIFIED WITH: Hughie Closs, [place], 12/14/16 AT 1711 BY J FUDESCO    Culture STAPHYLOCOCCUS AUREUS (A)  Final  Report Status 12/16/2016 FINAL  Final   Organism ID, Bacteria STAPHYLOCOCCUS AUREUS  Final      Susceptibility   Staphylococcus aureus - MIC*    CIPROFLOXACIN 2 INTERMEDIATE Intermediate     ERYTHROMYCIN RESISTANT Resistant     GENTAMICIN <=0.5 SENSITIVE Sensitive     OXACILLIN 0.5 SENSITIVE Sensitive     TETRACYCLINE <=1 SENSITIVE Sensitive     VANCOMYCIN <=0.5 SENSITIVE Sensitive     TRIMETH/SULFA <=10 SENSITIVE Sensitive     CLINDAMYCIN RESISTANT Resistant     RIFAMPIN <=0.5 SENSITIVE Sensitive     Inducible Clindamycin POSITIVE Resistant     * STAPHYLOCOCCUS AUREUS  Culture, blood (x 2)     Status: Abnormal   Collection Time: 12/13/16 11:22 PM  Result Value Ref Range Status   Specimen Description BLOOD LEFT ANTECUBITAL  Final   Special Requests BOTTLES DRAWN AEROBIC AND ANAEROBIC 5CC  Final   Culture  Setup Time   Final    GRAM POSITIVE COCCI IN CLUSTERS IN BOTH AEROBIC AND ANAEROBIC BOTTLES CRITICAL VALUE NOTED.  VALUE IS CONSISTENT WITH PREVIOUSLY REPORTED AND CALLED VALUE.    Culture (A)  Final    STAPHYLOCOCCUS AUREUS SUSCEPTIBILITIES PERFORMED ON PREVIOUS CULTURE WITHIN THE LAST 5 DAYS.    Report Status 12/16/2016 FINAL  Final  Blood Culture ID Panel (Reflexed)     Status: Abnormal   Collection Time: 12/13/16 11:22 PM  Result Value Ref Range Status   Enterococcus species NOT DETECTED  NOT DETECTED Final   Listeria monocytogenes NOT DETECTED NOT DETECTED Final   Staphylococcus species DETECTED (A) NOT DETECTED Final    Comment: CRITICAL RESULT CALLED TO, READ BACK BY AND VERIFIED WITH: Romero Belling [phone] AT 1711 BY J FUDESC    Staphylococcus aureus DETECTED (A) NOT DETECTED Final    Comment: Methicillin (oxacillin) susceptible Staphylococcus aureus (MSSA). Preferred therapy is anti staphylococcal beta lactam antibiotic (Cefazolin or Nafcillin), unless clinically contraindicated. CRITICAL RESULT CALLED TO, READ BACK BY AND VERIFIED WITH: Hughie Closs, PHARM, 12/14/16 AT 1711 BY J FUDESCO    Methicillin resistance NOT DETECTED NOT DETECTED Final   Streptococcus species NOT DETECTED NOT DETECTED Final   Streptococcus agalactiae NOT DETECTED NOT DETECTED Final   Streptococcus pneumoniae NOT DETECTED NOT DETECTED Final   Streptococcus pyogenes NOT DETECTED NOT DETECTED Final   Acinetobacter baumannii NOT DETECTED NOT DETECTED Final   Enterobacteriaceae species NOT DETECTED NOT DETECTED Final   Enterobacter cloacae complex NOT DETECTED NOT DETECTED Final   Escherichia coli NOT DETECTED NOT DETECTED Final   Klebsiella oxytoca NOT DETECTED NOT DETECTED Final   Klebsiella pneumoniae NOT DETECTED NOT DETECTED Final   Proteus species NOT DETECTED NOT DETECTED Final   Serratia marcescens NOT DETECTED NOT DETECTED Final   Haemophilus influenzae NOT DETECTED NOT DETECTED Final   Neisseria meningitidis NOT DETECTED NOT DETECTED Final   Pseudomonas aeruginosa NOT DETECTED NOT DETECTED Final   Candida albicans NOT DETECTED NOT DETECTED Final   Candida glabrata NOT DETECTED NOT DETECTED Final   Candida krusei NOT DETECTED NOT DETECTED Final   Candida parapsilosis NOT DETECTED NOT DETECTED Final   Candida tropicalis NOT DETECTED NOT DETECTED Final  Culture, blood (routine x 2)     Status: Abnormal   Collection Time: 12/15/16  9:48 AM  Result Value Ref Range Status   Specimen  Description BLOOD BLOOD RIGHT HAND  Final   Special Requests IN PEDIATRIC BOTTLE 2.5CC  Final   Culture  Setup Time   Final  GRAM POSITIVE COCCI IN CLUSTERS IN PEDIATRIC BOTTLE CRITICAL VALUE NOTED.  VALUE IS CONSISTENT WITH PREVIOUSLY REPORTED AND CALLED VALUE.    Culture (A)  Final    STAPHYLOCOCCUS AUREUS SUSCEPTIBILITIES PERFORMED ON PREVIOUS CULTURE WITHIN THE LAST 5 DAYS.    Report Status 12/17/2016 FINAL  Final  Culture, blood (routine x 2)     Status: None (Preliminary result)   Collection Time: 12/15/16  9:48 AM  Result Value Ref Range Status   Specimen Description BLOOD BLOOD LEFT HAND  Final   Special Requests IN PEDIATRIC BOTTLE 1CC  Final   Culture NO GROWTH 2 DAYS  Final   Report Status PENDING  Incomplete  Culture, blood (routine x 2)     Status: None (Preliminary result)   Collection Time: 12/16/16  1:00 PM  Result Value Ref Range Status   Specimen Description BLOOD RIGHT HAND  Final   Special Requests IN PEDIATRIC BOTTLE  1.5 CC  Final   Culture NO GROWTH 1 DAY  Final   Report Status PENDING  Incomplete  Culture, blood (routine x 2)     Status: None (Preliminary result)   Collection Time: 12/16/16  1:15 PM  Result Value Ref Range Status   Specimen Description BLOOD LEFT ARM  Final   Special Requests IN PEDIATRIC BOTTLE  1.5  Final   Culture NO GROWTH 1 DAY  Final   Report Status PENDING  Incomplete         Radiology Studies: No results found.      Scheduled Meds: .  ceFAZolin (ANCEF) IV  2 g Intravenous Q8H  . docusate sodium  100 mg Oral BID  . enoxaparin (LOVENOX) injection  40 mg Subcutaneous Q24H  . hydrochlorothiazide  25 mg Oral Daily  . methocarbamol  750 mg Oral TID  . nicotine  21 mg Transdermal Daily  . sodium chloride flush  3 mL Intravenous Q12H   Continuous Infusions: . 0.9 % NaCl with KCl 20 mEq / L 50 mL/hr at 12/16/16 1731     LOS: 3 days    Dron Tanna Furry, MD [facility] Pager [phone]  If 7PM-7AM,  please contact night-coverage [email or web address] Password [facility] 12/17/2016, 3:41 PM

## 2016-12-17 NOTE — Progress Notes (Signed)
PT Cancellation Note  Patient Details Name: Holly Weber MRN: [id number] DOB: 04-Dec-1965   Cancelled Treatment:    Reason Eval/Treat Not Completed: Medical issues which prohibited therapy (Pt with elevated BP and currently with a fever.  Will defer treatment at this time.  )   Cristela Blue 12/17/2016, 3:55 PM Governor Rooks, PTA pager [phone]

## 2016-12-17 NOTE — Progress Notes (Signed)
Echocardiogram Echocardiogram Transesophageal has been performed.  Holly Weber 12/17/2016, 11:28 AM

## 2016-12-17 NOTE — Progress Notes (Signed)
At 2330 patient requesting PRN pain meds for 10/10 left hip pain. RN administered 1 Norco and scheduled IV robaxin. Patient requesting other PRN pain meds and ativan with these medications. RN educated to patient that she needed to allow time for those meds given to start working and that we cannot give multiple narcotics to patients at the same time because it can cause respiratory depression. Patient began screaming at RN and asked to speak to the charge nurse. Charge nurse Stefan Church, came to see patient and informed patient to take deep breaths to slow her breathing and to stop tensing her body in order to help decrease her pain. Stefan Church, RN also reiterated to patient that she cannot have multiple PRN narcotics at the same time and patient stated, "Well I would like to have ativan since I can't have anything else."Stefan Church, RN paged Schorr,NP asking for Ativan. Schorr,NP placed order for PRN ativan with specific parameters. This RN went back to patient's room after Schorr,NP placed PRN order and patient was sleeping in bed and her son at bedside.

## 2016-12-17 NOTE — Anesthesia Preprocedure Evaluation (Signed)
Anesthesia Evaluation  Patient identified by MRN, date of birth, ID band Patient awake    Reviewed: Allergy & Precautions, NPO status , Patient's Chart, lab work & pertinent test results  Airway Mallampati: II  TM Distance: >3 FB Neck ROM: Full    Dental  (+) Teeth Intact, Dental Advisory Given   Pulmonary Current Smoker,    breath sounds clear to auscultation       Cardiovascular hypertension,  Rhythm:Regular Rate:Normal     Neuro/Psych PSYCHIATRIC DISORDERS Depression CVA    GI/Hepatic PUD, (+) Hepatitis -, C  Endo/Other  negative endocrine ROS  Renal/GU negative Renal ROS  negative genitourinary   Musculoskeletal negative musculoskeletal ROS (+)   Abdominal   Peds negative pediatric ROS (+)  Hematology negative hematology ROS (+)   Anesthesia Other Findings   Reproductive/Obstetrics negative OB ROS                            Anesthesia Physical Anesthesia Plan  ASA: II  Anesthesia Plan: MAC   Post-op Pain Management:    Induction: Intravenous  Airway Management Planned: Natural Airway  Additional Equipment:   Intra-op Plan:   Post-operative Plan:   Informed Consent: I have reviewed the patients History and Physical, chart, labs and discussed the procedure including the risks, benefits and alternatives for the proposed anesthesia with the patient or authorized representative who has indicated his/her understanding and acceptance.   Dental advisory given  Plan Discussed with: CRNA  Anesthesia Plan Comments:         Anesthesia Quick Evaluation

## 2016-12-17 NOTE — Progress Notes (Signed)
Patient ID: Holly Weber, female   DOB: 1966/09/04, 51 y.o.   MRN: [id number]          [facility]    Date of Admission:  12/13/2016   Total days of antibiotics 4         [name]'s back pain is unchanged. Repeat blood cultures are negative at 24 hours. There is no evidence of endocarditis by TEE. Repeat lumbar MRI and MRI of her left hip are pending. I will continue cefazolin. We will need to talk to her about placement in a skilled nursing facility for continued IV antibiotic therapy.          Michel Bickers, MD [facility] [facility] [phone] pager   [phone] cell 12/17/2016, 5:13 PM

## 2016-12-17 NOTE — Transfer of Care (Signed)
Immediate Anesthesia Transfer of Care Note  Patient: Holly Weber  Procedure(s) Performed: Procedure(s): TRANSESOPHAGEAL ECHOCARDIOGRAM (TEE) (N/A)  Patient Location: Endoscopy Unit  Anesthesia Type:MAC  Level of Consciousness: awake, oriented and patient cooperative  Airway & Oxygen Therapy: Patient Spontanous Breathing and Patient connected to nasal cannula oxygen  Post-op Assessment: Report given to RN, Post -op Vital signs reviewed and stable and Patient moving all extremities  Post vital signs: Reviewed and stable  Last Vitals:  Vitals:   12/17/16 0511 12/17/16 1021  BP: (!) 142/88 125/80  Pulse: 99 94  Resp: 16 15  Temp: 36.7 C 36.7 C    Last Pain:  Vitals:   12/17/16 1021  TempSrc: Oral  PainSc: 6       Patients Stated Pain Goal: 3 (15/95/39 [id number])  Complications: No apparent anesthesia complications

## 2016-12-17 NOTE — CV Procedure (Signed)
Anesthesia: Joslin Propofol  Normal EF Trivial MR Normal AV Normal TV/PV No SBE No vegetations Bubble positive late ? Pulmonary shunt No aortic debris   Jenkins Rouge

## 2016-12-17 NOTE — Interval H&P Note (Signed)
History and Physical Interval Note:  12/17/2016 10:59 AM  Holly Weber  has presented today for surgery, with the diagnosis of bactermia  The various methods of treatment have been discussed with the patient and family. After consideration of risks, benefits and other options for treatment, the patient has consented to  Procedure(s): TRANSESOPHAGEAL ECHOCARDIOGRAM (TEE) (N/A) as a surgical intervention .  The patient's history has been reviewed, patient examined, no change in status, stable for surgery.  I have reviewed the patient's chart and labs.  Questions were answered to the patient's satisfaction.     Jenkins Rouge

## 2016-12-18 DIAGNOSIS — M461 Sacroiliitis, not elsewhere classified: Secondary | ICD-10-CM | POA: Diagnosis present

## 2016-12-18 DIAGNOSIS — M4628 Osteomyelitis of vertebra, sacral and sacrococcygeal region: Secondary | ICD-10-CM

## 2016-12-18 LAB — BASIC METABOLIC PANEL
ANION GAP: 8 (ref 5–15)
BUN: 10 mg/dL (ref 6–20)
CALCIUM: 8.4 mg/dL — AB (ref 8.9–10.3)
CO2: 28 mmol/L (ref 22–32)
Chloride: 96 mmol/L — ABNORMAL LOW (ref 101–111)
Creatinine, Ser: 0.55 mg/dL (ref 0.44–1.00)
GFR calc Af Amer: >60 mL/min (ref >60)
GLUCOSE: 147 mg/dL — AB (ref 65–99)
POTASSIUM: 4 mmol/L (ref 3.5–5.1)
Sodium: 132 mmol/L — ABNORMAL LOW (ref 135–145)

## 2016-12-18 LAB — CBC
HCT: 37.4 % (ref 36.0–46.0)
Hemoglobin: 12.3 g/dL (ref 12.0–15.0)
MCH: 25.5 pg — ABNORMAL LOW (ref 26.0–34.0)
MCHC: 32.9 g/dL (ref 30.0–36.0)
MCV: 77.6 fL — ABNORMAL LOW (ref 78.0–100.0)
PLATELETS: 203 10*3/uL (ref 150–400)
RBC: 4.82 MIL/uL (ref 3.87–5.11)
RDW: 13.7 % (ref 11.5–15.5)
WBC: 6.7 10*3/uL (ref 4.0–10.5)

## 2016-12-18 MED ORDER — SODIUM CHLORIDE 0.9% FLUSH
10.0000 mL | INTRAVENOUS | Status: DC | PRN
  Administered 2016-12-20: 10 mL
  Filled 2016-12-18: qty 40

## 2016-12-18 MED ORDER — METHOCARBAMOL 500 MG PO TABS
1000.0000 mg | ORAL_TABLET | Freq: Four times a day (QID) | ORAL | Status: DC
  Administered 2016-12-18 – 2016-12-21 (×14): 1000 mg via ORAL
  Filled 2016-12-18 (×14): qty 2

## 2016-12-18 MED ORDER — PREGABALIN 75 MG PO CAPS
75.0000 mg | ORAL_CAPSULE | Freq: Two times a day (BID) | ORAL | Status: DC
  Administered 2016-12-18 – 2016-12-21 (×7): 75 mg via ORAL
  Filled 2016-12-18 (×7): qty 1

## 2016-12-18 MED ORDER — HYDROMORPHONE HCL 1 MG/ML IJ SOLN
1.0000 mg | INTRAMUSCULAR | Status: DC | PRN
  Administered 2016-12-18 – 2016-12-20 (×9): 1 mg via INTRAVENOUS
  Filled 2016-12-18 (×9): qty 1

## 2016-12-18 NOTE — Progress Notes (Signed)
Patient ID: Holly Weber, female   DOB: 1965/10/24, 51 y.o.   MRN: [id number]          [facility]  Date of Admission:  12/13/2016   Total days of antibiotics 5         Principal Problem:   Staphylococcus aureus bacteremia Active Problems:   Acute low back pain with left-sided sciatica   Pneumonia   Cocaine abuse   IV drug user   Tobacco abuse   HTN (hypertension)   Hyperglycemia   Normocytic anemia   .  ceFAZolin (ANCEF) IV  2 g Intravenous Q8H  . docusate sodium  100 mg Oral BID  . enoxaparin (LOVENOX) injection  40 mg Subcutaneous Q24H  . methocarbamol  1,000 mg Oral QID  . nicotine  21 mg Transdermal Daily  . pregabalin  75 mg Oral BID  . sodium chloride flush  3 mL Intravenous Q12H    SUBJECTIVE: She says that her low back pain and left hip pain is under slightly better control. She tells me that she is willing to go to a skilled nursing facility.  Review of Systems: Review of Systems  Constitutional: Negative for chills, diaphoresis and fever.  Gastrointestinal: Negative for abdominal pain, diarrhea, nausea and vomiting.  Musculoskeletal: Positive for back pain and joint pain.  Psychiatric/Behavioral: Positive for substance abuse.    Past Medical History:  Diagnosis Date  . Colitis, ulcerative ([place])   . Depression   . Hepatitis C   . Hypertension   . Stroke ([place])   . Thyroid disease     Social History  Substance Use Topics  . Smoking status: Current Every Day Smoker    Types: Cigarettes  . Smokeless tobacco: Never Used  . Alcohol use No    History reviewed. No pertinent family history. Allergies  Allergen Reactions  . Asa [Aspirin] Other (See Comments)    Ulcerative colitis flares.  . Nsaids Other (See Comments)    Ulcerative colitis flares  . Penicillins Rash and Other (See Comments)    Has patient had a PCN reaction causing immediate rash, facial/tongue/throat swelling, SOB or lightheadedness with  hypotension:unsure Has patient had a PCN reaction causing severe rash involving mucus membranes or skin necrosis:unsure Has patient had a PCN reaction that required hospitalization:No Has patient had a PCN reaction occurring within the last 10 years:Yes If all of the above answers are "NO", then may proceed with Cephalosporin use.     OBJECTIVE: Vitals:   12/17/16 1551 12/17/16 2111 12/18/16 0538 12/18/16 1407  BP: (!) 169/90 127/69 (!) 142/80 140/81  Pulse: (!) 110 99 85 90  Resp: 18 18 18 18   Temp: (!) 102.4 F (39.1 C) 98.7 F (37.1 C) 98.7 F (37.1 C) 98.6 F (37 C)  TempSrc:    Oral  SpO2: 97% 98% 97% 99%  Weight:      Height:       Body mass index is 26.64 kg/m.  Physical Exam  Constitutional:  She appears a little more comfortable today. She is sitting up in bed.  Cardiovascular: Normal rate and regular rhythm.   No murmur heard. Pulmonary/Chest: Effort normal and breath sounds normal.  Abdominal: Soft. There is no tenderness.    Lab Results Lab Results  Component Value Date   WBC 6.7 12/18/2016   HGB 12.3 12/18/2016   HCT 37.4 12/18/2016   MCV 77.6 (L) 12/18/2016   PLT 203 12/18/2016    Lab Results  Component Value  Date   CREATININE 0.55 12/18/2016   BUN 10 12/18/2016   NA 132 (L) 12/18/2016   K 4.0 12/18/2016   CL 96 (L) 12/18/2016   CO2 28 12/18/2016    Lab Results  Component Value Date   ALT 16 12/13/2016   AST 19 12/13/2016   ALKPHOS 110 12/13/2016   BILITOT 1.6 (H) 12/13/2016     Microbiology: Recent Results (from the past 240 hour(s))  Urine culture     Status: Abnormal   Collection Time: 12/13/16  8:34 AM  Result Value Ref Range Status   Specimen Description URINE, CLEAN CATCH  Final   Special Requests NONE  Final   Culture <10,000 COLONIES/mL INSIGNIFICANT GROWTH (A)  Final   Report Status 12/14/2016 FINAL  Final  Culture, blood (x 2)     Status: Abnormal   Collection Time: 12/13/16 11:22 PM  Result Value Ref Range Status    Specimen Description BLOOD LEFT ANTECUBITAL  Final   Special Requests BOTTLES DRAWN AEROBIC AND ANAEROBIC 5CC  Final   Culture  Setup Time   Final    GRAM POSITIVE COCCI IN CLUSTERS IN BOTH AEROBIC AND ANAEROBIC BOTTLES CRITICAL RESULT CALLED TO, READ BACK BY AND VERIFIED WITH: Hughie Closs, [place], 12/14/16 AT 12 BY J FUDESCO    Culture STAPHYLOCOCCUS AUREUS (A)  Final   Report Status 12/16/2016 FINAL  Final   Organism ID, Bacteria STAPHYLOCOCCUS AUREUS  Final      Susceptibility   Staphylococcus aureus - MIC*    CIPROFLOXACIN 2 INTERMEDIATE Intermediate     ERYTHROMYCIN RESISTANT Resistant     GENTAMICIN <=0.5 SENSITIVE Sensitive     OXACILLIN 0.5 SENSITIVE Sensitive     TETRACYCLINE <=1 SENSITIVE Sensitive     VANCOMYCIN <=0.5 SENSITIVE Sensitive     TRIMETH/SULFA <=10 SENSITIVE Sensitive     CLINDAMYCIN RESISTANT Resistant     RIFAMPIN <=0.5 SENSITIVE Sensitive     Inducible Clindamycin POSITIVE Resistant     * STAPHYLOCOCCUS AUREUS  Culture, blood (x 2)     Status: Abnormal   Collection Time: 12/13/16 11:22 PM  Result Value Ref Range Status   Specimen Description BLOOD LEFT ANTECUBITAL  Final   Special Requests BOTTLES DRAWN AEROBIC AND ANAEROBIC 5CC  Final   Culture  Setup Time   Final    GRAM POSITIVE COCCI IN CLUSTERS IN BOTH AEROBIC AND ANAEROBIC BOTTLES CRITICAL VALUE NOTED.  VALUE IS CONSISTENT WITH PREVIOUSLY REPORTED AND CALLED VALUE.    Culture (A)  Final    STAPHYLOCOCCUS AUREUS SUSCEPTIBILITIES PERFORMED ON PREVIOUS CULTURE WITHIN THE LAST 5 DAYS.    Report Status 12/16/2016 FINAL  Final  Blood Culture ID Panel (Reflexed)     Status: Abnormal   Collection Time: 12/13/16 11:22 PM  Result Value Ref Range Status   Enterococcus species NOT DETECTED NOT DETECTED Final   Listeria monocytogenes NOT DETECTED NOT DETECTED Final   Staphylococcus species DETECTED (A) NOT DETECTED Final    Comment: CRITICAL RESULT CALLED TO, READ BACK BY AND VERIFIED WITH: Romero Belling [id number] AT 1711 BY J FUDESC    Staphylococcus aureus DETECTED (A) NOT DETECTED Final    Comment: Methicillin (oxacillin) susceptible Staphylococcus aureus (MSSA). Preferred therapy is anti staphylococcal beta lactam antibiotic (Cefazolin or Nafcillin), unless clinically contraindicated. CRITICAL RESULT CALLED TO, READ BACK BY AND VERIFIED WITH: Hughie Closs, [facility], 12/14/16 AT 1711 BY J FUDESCO    Methicillin resistance NOT DETECTED NOT DETECTED Final   Streptococcus species NOT DETECTED NOT  DETECTED Final   Streptococcus agalactiae NOT DETECTED NOT DETECTED Final   Streptococcus pneumoniae NOT DETECTED NOT DETECTED Final   Streptococcus pyogenes NOT DETECTED NOT DETECTED Final   Acinetobacter baumannii NOT DETECTED NOT DETECTED Final   Enterobacteriaceae species NOT DETECTED NOT DETECTED Final   Enterobacter cloacae complex NOT DETECTED NOT DETECTED Final   Escherichia coli NOT DETECTED NOT DETECTED Final   Klebsiella oxytoca NOT DETECTED NOT DETECTED Final   Klebsiella pneumoniae NOT DETECTED NOT DETECTED Final   Proteus species NOT DETECTED NOT DETECTED Final   Serratia marcescens NOT DETECTED NOT DETECTED Final   Haemophilus influenzae NOT DETECTED NOT DETECTED Final   Neisseria meningitidis NOT DETECTED NOT DETECTED Final   Pseudomonas aeruginosa NOT DETECTED NOT DETECTED Final   Candida albicans NOT DETECTED NOT DETECTED Final   Candida glabrata NOT DETECTED NOT DETECTED Final   Candida krusei NOT DETECTED NOT DETECTED Final   Candida parapsilosis NOT DETECTED NOT DETECTED Final   Candida tropicalis NOT DETECTED NOT DETECTED Final  Culture, blood (routine x 2)     Status: Abnormal   Collection Time: 12/15/16  9:48 AM  Result Value Ref Range Status   Specimen Description BLOOD BLOOD RIGHT HAND  Final   Special Requests IN PEDIATRIC BOTTLE 2.5CC  Final   Culture  Setup Time   Final    GRAM POSITIVE COCCI IN CLUSTERS IN PEDIATRIC BOTTLE CRITICAL VALUE NOTED.  VALUE IS  CONSISTENT WITH PREVIOUSLY REPORTED AND CALLED VALUE.    Culture (A)  Final    STAPHYLOCOCCUS AUREUS SUSCEPTIBILITIES PERFORMED ON PREVIOUS CULTURE WITHIN THE LAST 5 DAYS.    Report Status 12/17/2016 FINAL  Final  Culture, blood (routine x 2)     Status: None (Preliminary result)   Collection Time: 12/15/16  9:48 AM  Result Value Ref Range Status   Specimen Description BLOOD BLOOD LEFT HAND  Final   Special Requests IN PEDIATRIC BOTTLE 1CC  Final   Culture NO GROWTH 3 DAYS  Final   Report Status PENDING  Incomplete  Culture, blood (routine x 2)     Status: None (Preliminary result)   Collection Time: 12/16/16  1:00 PM  Result Value Ref Range Status   Specimen Description BLOOD RIGHT HAND  Final   Special Requests IN PEDIATRIC BOTTLE  1.5 CC  Final   Culture NO GROWTH 2 DAYS  Final   Report Status PENDING  Incomplete  Culture, blood (routine x 2)     Status: None (Preliminary result)   Collection Time: 12/16/16  1:15 PM  Result Value Ref Range Status   Specimen Description BLOOD LEFT ARM  Final   Special Requests IN PEDIATRIC BOTTLE  1.5  Final   Culture NO GROWTH 2 DAYS  Final   Report Status PENDING  Incomplete     ASSESSMENT: She has MSSA bacteremia and left sacroiliitis with adjacent osteomyelitis. It would be ideal to treat this with 6 weeks of IV cefazolin. She tells me that she is willing to go to a skilled nursing facility. I would not favor trying to treat her at home with a PICC given active, recent injecting drug use. Her blood cultures are negative at 48 hours.  PLAN: 1. Continue cefazolin 2. PICC placement 3. SNF placement  Michel Bickers, MD [facility] for [place] [phone] pager   [phone] cell 12/18/2016, 4:14 PM

## 2016-12-18 NOTE — Progress Notes (Signed)
Peripherally Inserted Central Catheter/Midline Placement  The IV Nurse has discussed with the patient and/or persons authorized to consent for the patient, the purpose of this procedure and the potential benefits and risks involved with this procedure.  The benefits include less needle sticks, lab draws from the catheter, and the patient may be discharged home with the catheter. Risks include, but not limited to, infection, bleeding, blood clot (thrombus formation), and puncture of an artery; nerve damage and irregular heartbeat and possibility to perform a PICC exchange if needed/ordered by physician.  Alternatives to this procedure were also discussed.  Bard Power PICC patient education guide, fact sheet on infection prevention and patient information card has been provided to patient /or left at bedside.    PICC/Midline Placement Documentation        Holly Weber 12/18/2016, 5:02 PM

## 2016-12-18 NOTE — Anesthesia Postprocedure Evaluation (Addendum)
Anesthesia Post Note  Patient: Holly Weber  Procedure(s) Performed: Procedure(s) (LRB): TRANSESOPHAGEAL ECHOCARDIOGRAM (TEE) (N/A)  Patient location during evaluation: Endoscopy Anesthesia Type: MAC Level of consciousness: awake, awake and alert and oriented Pain management: pain level controlled Vital Signs Assessment: post-procedure vital signs reviewed and stable Respiratory status: spontaneous breathing, nonlabored ventilation and respiratory function stable Cardiovascular status: blood pressure returned to baseline Anesthetic complications: no       Last Vitals:  Vitals:   12/18/16 0538 12/18/16 1407  BP: (!) 142/80 140/81  Pulse: 85 90  Resp: 18 18  Temp: 37.1 C 37 C    Last Pain:  Vitals:   12/18/16 1957  TempSrc:   PainSc: 10-Worst pain ever                 Lela Murfin COKER

## 2016-12-18 NOTE — Progress Notes (Signed)
qPhysical Therapy Treatment Patient Details Name: Holly Weber MRN: [id number] DOB: 11-15-1965 Today's Date: 12/18/2016    History of Present Illness Patient is a 51 yo female admitted 12/13/16 with severe back pain with Lt sided sciatica, HTN, developed fever, pna.    PMH:  Hep C, depression, CVA, cocaine abuse, tobacco use    PT Comments    Pt in good spirits on arrival reports pain as a manageable 6/10 and noted to laugh and joke during treatment.  As treatment progressed gait speed diminished and increased pain reported from patient.  Pt has poor safety awareness when she is in pain.  She is at an increased risk for falling with her current presentation.  Plan next session to continue mobility to patient's tolerance.   Follow Up Recommendations  Home health PT;Supervision for mobility/OOB     Equipment Recommendations  Rolling walker with 5" wheels;3in1 (PT)    Recommendations for Other Services       Precautions / Restrictions Precautions Precautions: Fall Restrictions Weight Bearing Restrictions: No    Mobility  Bed Mobility Overal bed mobility: Needs Assistance Bed Mobility: Supine to Sit Rolling: Min assist   Supine to sit: Min assist Sit to supine: Mod assist   General bed mobility comments: Pt performed supine to sit with smoother progression, required min assist to advance LEs and elevate trunk into sitting.  for sit to supine patient required increased time with Mod assist to lift B LEs back into bed against gravity  Transfers Overall transfer level: Needs assistance Equipment used: Rolling walker (2 wheeled) Transfers: Sit to/from Stand Sit to Stand: Min assist         General transfer comment: Pt refused cueing for correct hand placement.  Pt pushed through RW with hands in place on hand grips when ascending to standing.  pt able to achieve erect stance without cueing.    Ambulation/Gait Ambulation/Gait assistance: Min assist Ambulation Distance  (Feet): 100 Feet Assistive device: Rolling walker (2 wheeled) Gait Pattern/deviations: Step-through pattern;Decreased stride length;Antalgic   Gait velocity interpretation: Below normal speed for age/gender General Gait Details: Pt performed 75 feet on gait without c/o pain during last 25 ft patient became tearful and gait speed decreased significantly.  Pt required cues for RW safety, increasing stride length and remaining focused on task at hand.     Stairs            Wheelchair Mobility    Modified Rankin (Stroke Patients Only)       Balance Overall balance assessment: Needs assistance Sitting-balance support: Bilateral upper extremity supported Sitting balance-Leahy Scale: Good       Standing balance-Leahy Scale: Poor Standing balance comment: Heavy reliance on UEs.                              Cognition Arousal/Alertness: Awake/alert Behavior During Therapy: WFL for tasks assessed/performed Overall Cognitive Status: Within Functional Limits for tasks assessed                                        Exercises      General Comments        Pertinent Vitals/Pain Pain Assessment: 0-10 Pain Score: 6  Pain Descriptors / Indicators: Sharp Pain Intervention(s): Limited activity within patient's tolerance    Home Living  Prior Function            PT Goals (current goals can now be found in the care plan section) Acute Rehab PT Goals Patient Stated Goal: To decrease pain Potential to Achieve Goals: Good Progress towards PT goals: Progressing toward goals    Frequency    Min 3X/week      PT Plan Current plan remains appropriate    Co-evaluation             End of Session Equipment Utilized During Treatment: Gait belt Activity Tolerance: Patient limited by pain Patient left: with call bell/phone within reach;in bed;with bed alarm set Nurse Communication: Mobility status;Patient  requests pain meds PT Visit Diagnosis: Other abnormalities of gait and mobility (R26.89);Pain Pain - Right/Left: Left Pain - part of body: Leg (back)     Time: 5110-2111 PT Time Calculation (min) (ACUTE ONLY): 57 min  Charges:  $Gait Training: 38-52 mins $Therapeutic Activity: 8-22 mins                    G Codes:       Governor Rooks, PTA pager [phone]    Cristela Blue 12/18/2016, 6:31 PM

## 2016-12-18 NOTE — Progress Notes (Signed)
PROGRESS NOTE    ALEESE Weber  HBZ:[id number] DOB: 03-25-1966 DOA: 12/13/2016 PCP: No PCP Per Patient   Brief Narrative: 51 year old female with ulcerative colitis, hep C, depression, history of stroke, cocaine abuse (last use was about 4 weeks ago. IV), ongoing tobacco use presented with progressive worsening lower back pain radiating down to the left leg with some numbness since 2 days prior to admission. Denied any bowel or urinary incontinence. Patient reports having significant difficulty to move around or amblyopia. Denies fevers, chills, vomiting, chest pain, palpitations, loss of consciousness, shortness of breath or abdominal pain. In the ED she was septic with fever of 101.91F, tachycardic, tachypneic and WBC of 16.5 K. CT and MRI of the lumbar spine was unremarkable for stenosis, discitis or abscess. Chest x-ray concerning for bilateral infiltrate. Blood cx from admission growing MSSA in both bottles. Admitted to hospitalist service for sepsis.  Assessment & Plan:  # Sepsis due to MSSA bacteremia:  - TEE with no vegetation. On IV cefazolin. Follow up ID's recommendation,consult appreciated. -follow up repeat cultures from 3/27.   # Back pain with left sided sciatica:  -CT/MRI if spine showed no acute findings. MRI of left hip obtained because of severe pain. 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 Currently on IV antibiotics. Pain medications adjusted today. Discontinued IV morphine and added IV Dilaudid as needed for breakthrough pain. Orthopedic consult requested and discussed with Silvestre Gunner PA.   -Continue to have therapy growing PT OT.  # Polysubstance abuse: Urine toxicology positive for cocaine and THC.  #Hypokalemia, hyponatremia: Electrolyte repleted. Potassium improved. Sodium 132. I will discontinue IV fluid. Encourage oral intake.  #  Hypertension: discontinue hydrochlorothiazide as patient has hyponatremia and  hypokalemia. Will order hydralazine as needed for elevated BP.   # Tobacco abuse: continue nicotine patch.   Principal Problem:   Staphylococcus aureus bacteremia Active Problems:   Acute low back pain with left-sided sciatica   Cocaine abuse   Tobacco abuse   HTN (hypertension)   Pneumonia   Hyperglycemia   Normocytic anemia   IV drug user  DVT prophylaxis:lovenox sq Code Status:full Family Communication:no family at bedside Disposition Plan:likely dc to SNF vs home depending on clinical improvement and if pt requires IV abx.    Consultants:   ID  Cards for TEE  Ortho consult  Procedures:TEE on 3/28 Antimicrobials:cefazolin since 3/26 Vancomycin 3/24-3/26  Subjective: Patient was seen and examined at bedside. Complaining of left hip pain and requesting stronger pain medication. Denied headache, dizziness, nausea, vomiting, chest pain or shortness of breath.  Objective: Vitals:   12/17/16 1125 12/17/16 1551 12/17/16 2111 12/18/16 0538  BP: 134/89 (!) 169/90 127/69 (!) 142/80  Pulse: 90 (!) 110 99 85  Resp: 20 18 18 18   Temp:  (!) 102.4 F (39.1 C) 98.7 F (37.1 C) 98.7 F (37.1 C)  TempSrc:      SpO2: 94% 97% 98% 97%  Weight:      Height:        Intake/Output Summary (Last 24 hours) at 12/18/16 1234 Last data filed at 12/18/16 1222  Gross per 24 hour  Intake           874.17 ml  Output             2125 ml  Net         -1250.83 ml   Filed Weights   12/13/16 1826  Weight: 72.6 kg (160 lb 1.6 oz)  Examination:  General exam: Lying on bed comfortable, not in distress.  Respiratory system: Clear to auscultation. Respiratory effort normal. No wheezing or crackle Cardiovascular system: S1 & S2 heard, RRR.  No pedal edema. Gastrointestinal system: Abdomen is nondistended, soft and nontender. Normal bowel sounds heard. Central nervous system: Alert Awake and following commands Skin: No rashes, lesions or ulcers Psychiatry: Judgement and insight  appear normal. Mood & affect appropriate.     Data Reviewed: I have personally reviewed following labs and imaging studies  CBC:  Recent Labs Lab 12/13/16 0825 12/13/16 1718 12/14/16 0117 12/17/16 0647 12/18/16 0619  WBC 14.2* 16.5* 12.4* 6.7 6.7  NEUTROABS 11.4*  --  10.0*  --   --   HGB 13.0 14.0 11.7* 12.3 12.3  HCT 38.8 41.8 35.7* 36.7 37.4  MCV 79.2 79.9 79.9 78.1 77.6*  PLT 167 164 154 180 161   Basic Metabolic Panel:  Recent Labs Lab 12/13/16 0825 12/13/16 1718 12/13/16 2322 12/15/16 0529 12/17/16 0647 12/18/16 0619  NA 134*  --  130* 131* 131* 132*  K 3.4*  --  3.3* 3.5 3.7 4.0  CL 101  --  93* 96* 92* 96*  CO2 23  --  24 27 27 28   GLUCOSE 147*  --  182* 119* 115* 147*  BUN 9  --  7 8 9 10   CREATININE 0.52 0.59 0.75 0.68 0.54 0.55  CALCIUM 8.8*  --  8.8* 8.5* 8.4* 8.4*  MG  --  1.6*  --   --   --   --    GFR: Estimated Creatinine Clearance: 84.9 mL/min (by C-G formula based on SCr of 0.55 mg/dL). Liver Function Tests:  Recent Labs Lab 12/13/16 2322  AST 19  ALT 16  ALKPHOS 110  BILITOT 1.6*  PROT 7.2  ALBUMIN 3.0*   No results for input(s): LIPASE, AMYLASE in the last 168 hours. No results for input(s): AMMONIA in the last 168 hours. Coagulation Profile:  Recent Labs Lab 12/13/16 0825 12/13/16 2322  INR 1.07 1.14   Cardiac Enzymes: No results for input(s): CKTOTAL, CKMB, CKMBINDEX, TROPONINI in the last 168 hours. BNP (last 3 results) No results for input(s): PROBNP in the last 8760 hours. HbA1C: No results for input(s): HGBA1C in the last 72 hours. CBG: No results for input(s): GLUCAP in the last 168 hours. Lipid Profile: No results for input(s): CHOL, HDL, LDLCALC, TRIG, CHOLHDL, LDLDIRECT in the last 72 hours. Thyroid Function Tests: No results for input(s): TSH, T4TOTAL, FREET4, T3FREE, THYROIDAB in the last 72 hours. Anemia Panel: No results for input(s): VITAMINB12, FOLATE, FERRITIN, TIBC, IRON, RETICCTPCT in the last 72  hours. Sepsis Labs:  Recent Labs Lab 12/13/16 2322 12/14/16 0117  PROCALCITON 0.15  --   LATICACIDVEN 1.5 0.7    Recent Results (from the past 240 hour(s))  Urine culture     Status: Abnormal   Collection Time: 12/13/16  8:34 AM  Result Value Ref Range Status   Specimen Description URINE, CLEAN CATCH  Final   Special Requests NONE  Final   Culture <10,000 COLONIES/mL INSIGNIFICANT GROWTH (A)  Final   Report Status 12/14/2016 FINAL  Final  Culture, blood (x 2)     Status: Abnormal   Collection Time: 12/13/16 11:22 PM  Result Value Ref Range Status   Specimen Description BLOOD LEFT ANTECUBITAL  Final   Special Requests BOTTLES DRAWN AEROBIC AND ANAEROBIC 5CC  Final   Culture  Setup Time   Final    GRAM POSITIVE  COCCI IN CLUSTERS IN BOTH AEROBIC AND ANAEROBIC BOTTLES CRITICAL RESULT CALLED TO, READ BACK BY AND VERIFIED WITH: Hughie Closs, [place], 12/14/16 AT 1711 BY J FUDESCO    Culture STAPHYLOCOCCUS AUREUS (A)  Final   Report Status 12/16/2016 FINAL  Final   Organism ID, Bacteria STAPHYLOCOCCUS AUREUS  Final      Susceptibility   Staphylococcus aureus - MIC*    CIPROFLOXACIN 2 INTERMEDIATE Intermediate     ERYTHROMYCIN RESISTANT Resistant     GENTAMICIN <=0.5 SENSITIVE Sensitive     OXACILLIN 0.5 SENSITIVE Sensitive     TETRACYCLINE <=1 SENSITIVE Sensitive     VANCOMYCIN <=0.5 SENSITIVE Sensitive     TRIMETH/SULFA <=10 SENSITIVE Sensitive     CLINDAMYCIN RESISTANT Resistant     RIFAMPIN <=0.5 SENSITIVE Sensitive     Inducible Clindamycin POSITIVE Resistant     * STAPHYLOCOCCUS AUREUS  Culture, blood (x 2)     Status: Abnormal   Collection Time: 12/13/16 11:22 PM  Result Value Ref Range Status   Specimen Description BLOOD LEFT ANTECUBITAL  Final   Special Requests BOTTLES DRAWN AEROBIC AND ANAEROBIC 5CC  Final   Culture  Setup Time   Final    GRAM POSITIVE COCCI IN CLUSTERS IN BOTH AEROBIC AND ANAEROBIC BOTTLES CRITICAL VALUE NOTED.  VALUE IS CONSISTENT WITH PREVIOUSLY  REPORTED AND CALLED VALUE.    Culture (A)  Final    STAPHYLOCOCCUS AUREUS SUSCEPTIBILITIES PERFORMED ON PREVIOUS CULTURE WITHIN THE LAST 5 DAYS.    Report Status 12/16/2016 FINAL  Final  Blood Culture ID Panel (Reflexed)     Status: Abnormal   Collection Time: 12/13/16 11:22 PM  Result Value Ref Range Status   Enterococcus species NOT DETECTED NOT DETECTED Final   Listeria monocytogenes NOT DETECTED NOT DETECTED Final   Staphylococcus species DETECTED (A) NOT DETECTED Final    Comment: CRITICAL RESULT CALLED TO, READ BACK BY AND VERIFIED WITH: Romero Belling [id number] AT 1711 BY J FUDESC    Staphylococcus aureus DETECTED (A) NOT DETECTED Final    Comment: Methicillin (oxacillin) susceptible Staphylococcus aureus (MSSA). Preferred therapy is anti staphylococcal beta lactam antibiotic (Cefazolin or Nafcillin), unless clinically contraindicated. CRITICAL RESULT CALLED TO, READ BACK BY AND VERIFIED WITH: Hughie Closs, PHARM, 12/14/16 AT 1711 BY J FUDESCO    Methicillin resistance NOT DETECTED NOT DETECTED Final   Streptococcus species NOT DETECTED NOT DETECTED Final   Streptococcus agalactiae NOT DETECTED NOT DETECTED Final   Streptococcus pneumoniae NOT DETECTED NOT DETECTED Final   Streptococcus pyogenes NOT DETECTED NOT DETECTED Final   Acinetobacter baumannii NOT DETECTED NOT DETECTED Final   Enterobacteriaceae species NOT DETECTED NOT DETECTED Final   Enterobacter cloacae complex NOT DETECTED NOT DETECTED Final   Escherichia coli NOT DETECTED NOT DETECTED Final   Klebsiella oxytoca NOT DETECTED NOT DETECTED Final   Klebsiella pneumoniae NOT DETECTED NOT DETECTED Final   Proteus species NOT DETECTED NOT DETECTED Final   Serratia marcescens NOT DETECTED NOT DETECTED Final   Haemophilus influenzae NOT DETECTED NOT DETECTED Final   Neisseria meningitidis NOT DETECTED NOT DETECTED Final   Pseudomonas aeruginosa NOT DETECTED NOT DETECTED Final   Candida albicans NOT DETECTED NOT DETECTED  Final   Candida glabrata NOT DETECTED NOT DETECTED Final   Candida krusei NOT DETECTED NOT DETECTED Final   Candida parapsilosis NOT DETECTED NOT DETECTED Final   Candida tropicalis NOT DETECTED NOT DETECTED Final  Culture, blood (routine x 2)     Status: Abnormal   Collection Time: 12/15/16  9:48 AM  Result Value Ref Range Status   Specimen Description BLOOD BLOOD RIGHT HAND  Final   Special Requests IN PEDIATRIC BOTTLE 2.5CC  Final   Culture  Setup Time   Final    GRAM POSITIVE COCCI IN CLUSTERS IN PEDIATRIC BOTTLE CRITICAL VALUE NOTED.  VALUE IS CONSISTENT WITH PREVIOUSLY REPORTED AND CALLED VALUE.    Culture (A)  Final    STAPHYLOCOCCUS AUREUS SUSCEPTIBILITIES PERFORMED ON PREVIOUS CULTURE WITHIN THE LAST 5 DAYS.    Report Status 12/17/2016 FINAL  Final  Culture, blood (routine x 2)     Status: None (Preliminary result)   Collection Time: 12/15/16  9:48 AM  Result Value Ref Range Status   Specimen Description BLOOD BLOOD LEFT HAND  Final   Special Requests IN PEDIATRIC BOTTLE 1CC  Final   Culture NO GROWTH 2 DAYS  Final   Report Status PENDING  Incomplete  Culture, blood (routine x 2)     Status: None (Preliminary result)   Collection Time: 12/16/16  1:00 PM  Result Value Ref Range Status   Specimen Description BLOOD RIGHT HAND  Final   Special Requests IN PEDIATRIC BOTTLE  1.5 CC  Final   Culture NO GROWTH 1 DAY  Final   Report Status PENDING  Incomplete  Culture, blood (routine x 2)     Status: None (Preliminary result)   Collection Time: 12/16/16  1:15 PM  Result Value Ref Range Status   Specimen Description BLOOD LEFT ARM  Final   Special Requests IN PEDIATRIC BOTTLE  1.5  Final   Culture NO GROWTH 1 DAY  Final   Report Status PENDING  Incomplete         Radiology Studies: Mr Hip Left W Wo Contrast  Result Date: 12/18/2016 CLINICAL DATA:  Patient admitted to the hospital 12/13/2016 with bacteremia. Severe left hip pain. No known injury. History of IV drug  abuse. EXAM: MRI OF THE LEFT HIP WITHOUT AND WITH CONTRAST TECHNIQUE: Multiplanar, multisequence MR imaging was performed both before and after administration of intravenous contrast. CONTRAST:  15 cc MultiHance IV. COMPARISON:  Plain films left hip 12/13/2016. FINDINGS: Bones: There is marrow edema and enhancement about the left sacroiliac joint most consistent with osteomyelitis. Marrow signal is diffusely heterogeneous as can be seen in cigarette smoking or obesity. There is no avascular necrosis of the femoral heads. No fracture. Articular cartilage and labrum Articular cartilage:  Normal. Labrum:  Intact. Joint or bursal effusion Joint effusion:  Small left SI joint effusion is seen. Bursae:  Negative. Muscles and tendons Muscles and tendons: Intact. There is some edema and enhancement in musculature about the left SI joint including the iliacus and gluteus medius. No intramuscular fluid collection is identified although visualization is somewhat limited on this hip MRI. Other findings Miscellaneous: Imaged intrapelvic contents demonstrate no acute abnormality. IMPRESSION: Findings most consistent with a septic SI joint on the left with surrounding osteomyelitis and myositis. No abscess is identified. Electronically Signed   By: Inge Rise M.D.   On: 12/18/2016 08:22        Scheduled Meds: .  ceFAZolin (ANCEF) IV  2 g Intravenous Q8H  . docusate sodium  100 mg Oral BID  . enoxaparin (LOVENOX) injection  40 mg Subcutaneous Q24H  . methocarbamol  750 mg Oral TID  . nicotine  21 mg Transdermal Daily  . sodium chloride flush  3 mL Intravenous Q12H   Continuous Infusions: . 0.9 % NaCl with KCl 20 mEq /  L 50 mL/hr at 12/17/16 1630     LOS: 4 days    Dron Tanna Furry, MD [facility] Pager [phone]  If 7PM-7AM, please contact night-coverage [email or web address] Password [facility] 12/18/2016, 12:34 PM

## 2016-12-18 NOTE — Consult Note (Signed)
Reason for Consult:Septic sacroiliac joint Referring Physician: [name] is an 51 y.o. female.  HPI: Holly Weber was admitted 3/24 with sepsis. She had been having severe back pain with radiation to the left leg which is what prompted her visit to the ED. She was found to be bacteremic with staph (not MRSA). Though she has improved from a septic standpoint her back and hip pain are no better. A MRI of her lumbar spine was attempted but apparently could not be completed (the assumption was 2/2 motion). She did complete a MRI of her left hip last night that showed sacroiliitis presumably from a septic joint. There was no abscess. Orthopedics was asked to consult.  Past Medical History:  Diagnosis Date  . Colitis, ulcerative ([place])   . Depression   . Hepatitis C   . Hypertension   . Stroke ([place])   . Thyroid disease     Past Surgical History:  Procedure Laterality Date  . ARM WOUND REPAIR / CLOSURE    . COLONOSCOPY    . TEE WITHOUT CARDIOVERSION N/A 12/17/2016   Procedure: TRANSESOPHAGEAL ECHOCARDIOGRAM (TEE);  Surgeon: Josue Hector, MD;  Location[facility] ENDOSCOPY;  Service: Cardiovascular;  Laterality: N/A;  . TONSILLECTOMY      History reviewed. No pertinent family history.  Social History:  reports that she has been smoking Cigarettes.  She has never used smokeless tobacco. She reports that she uses drugs, including Cocaine. She reports that she does not drink alcohol.  Allergies:  Allergies  Allergen Reactions  . Asa [Aspirin] Other (See Comments)    Ulcerative colitis flares.  . Nsaids Other (See Comments)    Ulcerative colitis flares  . Penicillins Rash and Other (See Comments)    Has patient had a PCN reaction causing immediate rash, facial/tongue/throat swelling, SOB or lightheadedness with hypotension:unsure Has patient had a PCN reaction causing severe rash involving mucus membranes or skin necrosis:unsure Has patient had a PCN reaction that required  hospitalization:No Has patient had a PCN reaction occurring within the last 10 years:Yes If all of the above answers are "NO", then may proceed with Cephalosporin use.     Medications: I have reviewed the patient's current medications.  Results for orders placed or performed during the hospital encounter of 12/13/16 (from the past 48 hour(s))  CBC     Status: None   Collection Time: 12/17/16  6:47 AM  Result Value Ref Range   WBC 6.7 4.0 - 10.5 K/uL   RBC 4.70 3.87 - 5.11 MIL/uL   Hemoglobin 12.3 12.0 - 15.0 g/dL   HCT 36.7 36.0 - 46.0 %   MCV 78.1 78.0 - 100.0 fL   MCH 26.2 26.0 - 34.0 pg   MCHC 33.5 30.0 - 36.0 g/dL   RDW 13.7 11.5 - 15.5 %   Platelets 180 150 - 400 K/uL  Basic metabolic panel     Status: Abnormal   Collection Time: 12/17/16  6:47 AM  Result Value Ref Range   Sodium 131 (L) 135 - 145 mmol/L   Potassium 3.7 3.5 - 5.1 mmol/L   Chloride 92 (L) 101 - 111 mmol/L   CO2 27 22 - 32 mmol/L   Glucose, Bld 115 (H) 65 - 99 mg/dL   BUN 9 6 - 20 mg/dL   Creatinine, Ser 0.54 0.44 - 1.00 mg/dL   Calcium 8.4 (L) 8.9 - 10.3 mg/dL   GFR calc non Af Amer >60 >60 mL/min   GFR calc Af Amer >  60 >60 mL/min    Comment: (NOTE) The eGFR has been calculated using the CKD EPI equation. This calculation has not been validated in all clinical situations. eGFR's persistently <60 mL/min signify possible Chronic Kidney Disease.    Anion gap 12 5 - 15  CBC     Status: Abnormal   Collection Time: 12/18/16  6:19 AM  Result Value Ref Range   WBC 6.7 4.0 - 10.5 K/uL   RBC 4.82 3.87 - 5.11 MIL/uL   Hemoglobin 12.3 12.0 - 15.0 g/dL   HCT 37.4 36.0 - 46.0 %   MCV 77.6 (L) 78.0 - 100.0 fL   MCH 25.5 (L) 26.0 - 34.0 pg   MCHC 32.9 30.0 - 36.0 g/dL   RDW 13.7 11.5 - 15.5 %   Platelets 203 150 - 400 K/uL  Basic metabolic panel     Status: Abnormal   Collection Time: 12/18/16  6:19 AM  Result Value Ref Range   Sodium 132 (L) 135 - 145 mmol/L   Potassium 4.0 3.5 - 5.1 mmol/L   Chloride  96 (L) 101 - 111 mmol/L   CO2 28 22 - 32 mmol/L   Glucose, Bld 147 (H) 65 - 99 mg/dL   BUN 10 6 - 20 mg/dL   Creatinine, Ser 0.55 0.44 - 1.00 mg/dL   Calcium 8.4 (L) 8.9 - 10.3 mg/dL   GFR calc non Af Amer >60 >60 mL/min   GFR calc Af Amer >60 >60 mL/min    Comment: (NOTE) The eGFR has been calculated using the CKD EPI equation. This calculation has not been validated in all clinical situations. eGFR's persistently <60 mL/min signify possible Chronic Kidney Disease.    Anion gap 8 5 - 15    Mr Hip Left W Wo Contrast  Result Date: 12/18/2016 CLINICAL DATA:  Patient admitted to the hospital 12/13/2016 with bacteremia. Severe left hip pain. No known injury. History of IV drug abuse. EXAM: MRI OF THE LEFT HIP WITHOUT AND WITH CONTRAST TECHNIQUE: Multiplanar, multisequence MR imaging was performed both before and after administration of intravenous contrast. CONTRAST:  15 cc MultiHance IV. COMPARISON:  Plain films left hip 12/13/2016. FINDINGS: Bones: There is marrow edema and enhancement about the left sacroiliac joint most consistent with osteomyelitis. Marrow signal is diffusely heterogeneous as can be seen in cigarette smoking or obesity. There is no avascular necrosis of the femoral heads. No fracture. Articular cartilage and labrum Articular cartilage:  Normal. Labrum:  Intact. Joint or bursal effusion Joint effusion:  Small left SI joint effusion is seen. Bursae:  Negative. Muscles and tendons Muscles and tendons: Intact. There is some edema and enhancement in musculature about the left SI joint including the iliacus and gluteus medius. No intramuscular fluid collection is identified although visualization is somewhat limited on this hip MRI. Other findings Miscellaneous: Imaged intrapelvic contents demonstrate no acute abnormality. IMPRESSION: Findings most consistent with a septic SI joint on the left with surrounding osteomyelitis and myositis. No abscess is identified. Electronically Signed    By: Inge Rise M.D.   On: 12/18/2016 08:22    Review of Systems  Constitutional: Positive for chills and fever. Negative for weight loss.  HENT: Negative for ear discharge, ear pain, hearing loss and tinnitus.   Eyes: Negative for blurred vision, double vision, photophobia and pain.  Respiratory: Negative for cough, sputum production and shortness of breath.   Cardiovascular: Negative for chest pain.  Gastrointestinal: Negative for abdominal pain, nausea and vomiting.  Genitourinary: Negative for  dysuria, flank pain, frequency and urgency.  Musculoskeletal: Positive for back pain and joint pain (Left hip). Negative for falls, myalgias and neck pain.  Neurological: Negative for dizziness, tingling, sensory change, focal weakness, loss of consciousness and headaches.  Endo/Heme/Allergies: Does not bruise/bleed easily.  Psychiatric/Behavioral: Negative for depression, memory loss and substance abuse. The patient is not nervous/anxious.    Blood pressure (!) 142/80, pulse 85, temperature 98.7 F (37.1 C), resp. rate 18, height 5' 5"  (1.651 m), weight 72.6 kg (160 lb 1.6 oz), SpO2 97 %. Physical Exam  Constitutional: She appears well-developed and well-nourished. No distress.  HENT:  Head: Normocephalic and atraumatic.  Eyes: Conjunctivae are normal. Right eye exhibits no discharge. Left eye exhibits no discharge. No scleral icterus.  Neck: Normal range of motion. Neck supple.  Cardiovascular: Normal rate and regular rhythm.   Respiratory: Effort normal. No respiratory distress.  GI: Soft.  Musculoskeletal:       Lumbar back: She exhibits tenderness (Mild).  Right shoulder, elbow, wrist, digits- no skin wounds, nontender, no instability, no blocks to motion  Sens  Ax/R/M/U intact  Mot   Ax/ R/ PIN/ M/ AIN/ U intact  Rad 2+  Left shoulder, elbow, wrist, digits- no skin wounds, nontender, no instability, no blocks to motion  Sens  Ax/R/M/U intact  Mot   Ax/ R/ PIN/ M/ AIN/ U  intact  Rad 2+  Pelvis--no traumatic wounds or rash, no ecchymosis, stable to manual stress, nontender  LLE No traumatic wounds, ecchymosis, or rash  TTP over left trochanter             No pain with passive/active hip flexion or internal rotation, severe pain with passive external rotation  No effusions  Knee stable to varus/ valgus and anterior/posterior stress  Sens DPN, SPN, TN intact  Motor EHL, ext, flex, evers 5/5  DP 2+, PT 2+, No significant edema  RLE No traumatic wounds, ecchymosis, or rash  Nontender  No effusions  Knee stable to varus/ valgus and anterior/posterior stress  Sens DPN, SPN, TN intact  Motor EHL, ext, flex, evers 5/5  DP 2+, PT 2+, No significant edema  Lymphadenopathy:    She has no cervical adenopathy.  Neurological: She is alert.  Skin: Skin is warm. She is diaphoretic.  Psychiatric: She has a normal mood and affect. Her behavior is normal.    Assessment/Plan: Left sacroiliitis -- The patient's exam does not correlate with a septic SI joint. As her constitutional symptoms have improved would continue with antibiotic treatment. I think her pain is likely coming from pathology in that area, namely sciatic irritation due to the sacroiliitis. I have increased her Robaxin to 1057m qid and added Lyrica 754mbid. She would benefit from NSAID treatment (would recommend Naproxen 50020mid) but as it seems to precipitate her UC I will leave that to her primary team to discuss the pros and cons with her. There is no surgical indication.    MicLisette AbuA-C Orthopedic Surgery [phone]/2018, 1:29 PM

## 2016-12-18 NOTE — Plan of Care (Signed)
Problem: Physical Regulation: Goal: Will remain free from infection Outcome: Progressing Remains on IV abx.  Problem: Activity: Goal: Risk for activity intolerance will decrease Outcome: Progressing Walked in halls with PT today

## 2016-12-19 DIAGNOSIS — Z95828 Presence of other vascular implants and grafts: Secondary | ICD-10-CM

## 2016-12-19 DIAGNOSIS — B182 Chronic viral hepatitis C: Secondary | ICD-10-CM

## 2016-12-19 DIAGNOSIS — M461 Sacroiliitis, not elsewhere classified: Secondary | ICD-10-CM

## 2016-12-19 MED ORDER — POLYETHYLENE GLYCOL 3350 17 G PO PACK
17.0000 g | PACK | Freq: Every day | ORAL | Status: DC
  Administered 2016-12-19 – 2016-12-21 (×3): 17 g via ORAL
  Filled 2016-12-19 (×3): qty 1

## 2016-12-19 NOTE — Progress Notes (Signed)
PHARMACY CONSULT NOTE FOR:  OUTPATIENT  PARENTERAL ANTIBIOTIC THERAPY (OPAT)  Indication: MSSA bacteremia and left sacroiliitis with adjacent osteomyelitis Regimen: Ancef 2gm IV q8h End date: Jan 25, 2017 (six weeks)  IV antibiotic discharge orders are pended. To discharging provider:  please sign these orders via discharge navigator,  Select New Orders & click on the button choice - Manage This Unsigned Work.     Thank you for allowing pharmacy to be a part of this patient's care.  Cruzita Lipa, Rocky Crafts 12/19/2016, 12:40 PM

## 2016-12-19 NOTE — Progress Notes (Signed)
PROGRESS NOTE    Holly Weber  VOZ:[id number] DOB: Oct 19, 1965 DOA: 12/13/2016 PCP: No PCP Per Patient   Brief Narrative: 51 year old female with ulcerative colitis, hep C, depression, history of stroke, cocaine abuse (last use was about 4 weeks ago. IV), ongoing tobacco use presented with progressive worsening lower back pain radiating down to the left leg with some numbness since 2 days prior to admission. Denied any bowel or urinary incontinence. Patient reports having significant difficulty to move around or amblyopia. Denies fevers, chills, vomiting, chest pain, palpitations, loss of consciousness, shortness of breath or abdominal pain. In the ED she was septic with fever of 101.61F, tachycardic, tachypneic and WBC of 16.5 K. CT and MRI of the lumbar spine was unremarkable for stenosis, discitis or abscess. Chest x-ray concerning for bilateral infiltrate. Blood cx from admission growing MSSA in both bottles. Admitted to hospitalist service for sepsis.  Assessment & Plan:  # Sepsis due to MSSA bacteremia:  - TEE with no vegetation.  -Planned for 6 weeks of IV cefazolin as per Dr. Megan Salon. Patient needs a skilled nursing facility for IV antibiotics. Social worker is following. Patient is difficult to place at [facility]. -follow up repeat cultures from 3/27.   # Back pain with left sided sciatica:  -CT/MRI if spine showed no acute findings. MRI of left hip obtained because of severe pain. 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 Currently on IV antibiotics.  -Pain is better controlled with current regimen. Education provided to the patient regarding minimizing Norcotics use. We'll order bowel regimen. No surgical intervention as per orthopedics.   -Continue to have therapy growing PT OT.  # Polysubstance abuse: Urine toxicology positive for cocaine and THC.  #Hypokalemia, hyponatremia: Electrolyte repleted. Potassium improved. Sodium 132. I will  discontinue IV fluid. Encourage oral intake.  #  Hypertension: discontinue hydrochlorothiazide as patient has hyponatremia and hypokalemia. Will order hydralazine as needed for elevated BP.   # Tobacco abuse: continue nicotine patch.   Principal Problem:   Staphylococcus aureus bacteremia Active Problems:   Cocaine abuse   Tobacco abuse   HTN (hypertension)   Pneumonia   Hyperglycemia   Normocytic anemia   IV drug user   Sacroiliitis ([place])  DVT prophylaxis:lovenox sq Code Status:full Family Communication:no family at bedside Disposition Plan:likely dc to SNF vs home depending on clinical improvement and if pt requires IV abx.    Consultants:   ID  Cards for TEE  Ortho consult  Procedures:TEE on 3/28 Antimicrobials:cefazolin since 3/26 Vancomycin 3/24-3/26  Subjective: Patient was seen and examined at bedside. He pain is better today. Denied headache, dizziness, nausea, vomiting, chest pain or shortness of breath.  Objective: Vitals:   12/18/16 1407 12/18/16 2207 12/19/16 0648 12/19/16 1340  BP: 140/81 (!) 167/82 (!) 164/91 (!) 164/95  Pulse: 90 (!) 101 96 89  Resp: 18 18 18 20   Temp: 98.6 F (37 C) 98.7 F (37.1 C) 99.5 F (37.5 C) 97.7 F (36.5 C)  TempSrc: Oral Oral Oral Oral  SpO2: 99% 93% 96% 98%  Weight:      Height:        Intake/Output Summary (Last 24 hours) at 12/19/16 1556 Last data filed at 12/19/16 1447  Gross per 24 hour  Intake                0 ml  Output             2250 ml  Net            -  2250 ml   Filed Weights   12/13/16 1826  Weight: 72.6 kg (160 lb 1.6 oz)    Examination:  General exam: Lying on bed comfortable, not in distress Respiratory system: Clear bilateral. Respiratory effort normal. No wheezing or crackle Cardiovascular system: Regular rate rhythm, S1-S2 normal.  No pedal edema. Gastrointestinal system: Abdomen is nondistended, soft and nontender. Normal bowel sounds heard. Central nervous system: Alert Awake and  following commands Skin: No rashes, lesions or ulcers Psychiatry: Judgement and insight appear normal. Mood & affect appropriate.     Data Reviewed: I have personally reviewed following labs and imaging studies  CBC:  Recent Labs Lab 12/13/16 0825 12/13/16 1718 12/14/16 0117 12/17/16 0647 12/18/16 0619  WBC 14.2* 16.5* 12.4* 6.7 6.7  NEUTROABS 11.4*  --  10.0*  --   --   HGB 13.0 14.0 11.7* 12.3 12.3  HCT 38.8 41.8 35.7* 36.7 37.4  MCV 79.2 79.9 79.9 78.1 77.6*  PLT 167 164 154 180 956   Basic Metabolic Panel:  Recent Labs Lab 12/13/16 0825 12/13/16 1718 12/13/16 2322 12/15/16 0529 12/17/16 0647 12/18/16 0619  NA 134*  --  130* 131* 131* 132*  K 3.4*  --  3.3* 3.5 3.7 4.0  CL 101  --  93* 96* 92* 96*  CO2 23  --  24 27 27 28   GLUCOSE 147*  --  182* 119* 115* 147*  BUN 9  --  7 8 9 10   CREATININE 0.52 0.59 0.75 0.68 0.54 0.55  CALCIUM 8.8*  --  8.8* 8.5* 8.4* 8.4*  MG  --  1.6*  --   --   --   --    GFR: Estimated Creatinine Clearance: 84.9 mL/min (by C-G formula based on SCr of 0.55 mg/dL). Liver Function Tests:  Recent Labs Lab 12/13/16 2322  AST 19  ALT 16  ALKPHOS 110  BILITOT 1.6*  PROT 7.2  ALBUMIN 3.0*   No results for input(s): LIPASE, AMYLASE in the last 168 hours. No results for input(s): AMMONIA in the last 168 hours. Coagulation Profile:  Recent Labs Lab 12/13/16 0825 12/13/16 2322  INR 1.07 1.14   Cardiac Enzymes: No results for input(s): CKTOTAL, CKMB, CKMBINDEX, TROPONINI in the last 168 hours. BNP (last 3 results) No results for input(s): PROBNP in the last 8760 hours. HbA1C: No results for input(s): HGBA1C in the last 72 hours. CBG: No results for input(s): GLUCAP in the last 168 hours. Lipid Profile: No results for input(s): CHOL, HDL, LDLCALC, TRIG, CHOLHDL, LDLDIRECT in the last 72 hours. Thyroid Function Tests: No results for input(s): TSH, T4TOTAL, FREET4, T3FREE, THYROIDAB in the last 72 hours. Anemia Panel: No  results for input(s): VITAMINB12, FOLATE, FERRITIN, TIBC, IRON, RETICCTPCT in the last 72 hours. Sepsis Labs:  Recent Labs Lab 12/13/16 2322 12/14/16 0117  PROCALCITON 0.15  --   LATICACIDVEN 1.5 0.7    Recent Results (from the past 240 hour(s))  Urine culture     Status: Abnormal   Collection Time: 12/13/16  8:34 AM  Result Value Ref Range Status   Specimen Description URINE, CLEAN CATCH  Final   Special Requests NONE  Final   Culture <10,000 COLONIES/mL INSIGNIFICANT GROWTH (A)  Final   Report Status 12/14/2016 FINAL  Final  Culture, blood (x 2)     Status: Abnormal   Collection Time: 12/13/16 11:22 PM  Result Value Ref Range Status   Specimen Description BLOOD LEFT ANTECUBITAL  Final   Special Requests BOTTLES DRAWN AEROBIC  AND ANAEROBIC 5CC  Final   Culture  Setup Time   Final    GRAM POSITIVE COCCI IN CLUSTERS IN BOTH AEROBIC AND ANAEROBIC BOTTLES CRITICAL RESULT CALLED TO, READ BACK BY AND VERIFIED WITH: Hughie Closs, [place], 12/14/16 AT 1711 BY J FUDESCO    Culture STAPHYLOCOCCUS AUREUS (A)  Final   Report Status 12/16/2016 FINAL  Final   Organism ID, Bacteria STAPHYLOCOCCUS AUREUS  Final      Susceptibility   Staphylococcus aureus - MIC*    CIPROFLOXACIN 2 INTERMEDIATE Intermediate     ERYTHROMYCIN RESISTANT Resistant     GENTAMICIN <=0.5 SENSITIVE Sensitive     OXACILLIN 0.5 SENSITIVE Sensitive     TETRACYCLINE <=1 SENSITIVE Sensitive     VANCOMYCIN <=0.5 SENSITIVE Sensitive     TRIMETH/SULFA <=10 SENSITIVE Sensitive     CLINDAMYCIN RESISTANT Resistant     RIFAMPIN <=0.5 SENSITIVE Sensitive     Inducible Clindamycin POSITIVE Resistant     * STAPHYLOCOCCUS AUREUS  Culture, blood (x 2)     Status: Abnormal   Collection Time: 12/13/16 11:22 PM  Result Value Ref Range Status   Specimen Description BLOOD LEFT ANTECUBITAL  Final   Special Requests BOTTLES DRAWN AEROBIC AND ANAEROBIC 5CC  Final   Culture  Setup Time   Final    GRAM POSITIVE COCCI IN CLUSTERS IN BOTH  AEROBIC AND ANAEROBIC BOTTLES CRITICAL VALUE NOTED.  VALUE IS CONSISTENT WITH PREVIOUSLY REPORTED AND CALLED VALUE.    Culture (A)  Final    STAPHYLOCOCCUS AUREUS SUSCEPTIBILITIES PERFORMED ON PREVIOUS CULTURE WITHIN THE LAST 5 DAYS.    Report Status 12/16/2016 FINAL  Final  Blood Culture ID Panel (Reflexed)     Status: Abnormal   Collection Time: 12/13/16 11:22 PM  Result Value Ref Range Status   Enterococcus species NOT DETECTED NOT DETECTED Final   Listeria monocytogenes NOT DETECTED NOT DETECTED Final   Staphylococcus species DETECTED (A) NOT DETECTED Final    Comment: CRITICAL RESULT CALLED TO, READ BACK BY AND VERIFIED WITH: Romero Belling [id number] AT 1711 BY J FUDESC    Staphylococcus aureus DETECTED (A) NOT DETECTED Final    Comment: Methicillin (oxacillin) susceptible Staphylococcus aureus (MSSA). Preferred therapy is anti staphylococcal beta lactam antibiotic (Cefazolin or Nafcillin), unless clinically contraindicated. CRITICAL RESULT CALLED TO, READ BACK BY AND VERIFIED WITH: Hughie Closs, PHARM, 12/14/16 AT 1711 BY J FUDESCO    Methicillin resistance NOT DETECTED NOT DETECTED Final   Streptococcus species NOT DETECTED NOT DETECTED Final   Streptococcus agalactiae NOT DETECTED NOT DETECTED Final   Streptococcus pneumoniae NOT DETECTED NOT DETECTED Final   Streptococcus pyogenes NOT DETECTED NOT DETECTED Final   Acinetobacter baumannii NOT DETECTED NOT DETECTED Final   Enterobacteriaceae species NOT DETECTED NOT DETECTED Final   Enterobacter cloacae complex NOT DETECTED NOT DETECTED Final   Escherichia coli NOT DETECTED NOT DETECTED Final   Klebsiella oxytoca NOT DETECTED NOT DETECTED Final   Klebsiella pneumoniae NOT DETECTED NOT DETECTED Final   Proteus species NOT DETECTED NOT DETECTED Final   Serratia marcescens NOT DETECTED NOT DETECTED Final   Haemophilus influenzae NOT DETECTED NOT DETECTED Final   Neisseria meningitidis NOT DETECTED NOT DETECTED Final   Pseudomonas  aeruginosa NOT DETECTED NOT DETECTED Final   Candida albicans NOT DETECTED NOT DETECTED Final   Candida glabrata NOT DETECTED NOT DETECTED Final   Candida krusei NOT DETECTED NOT DETECTED Final   Candida parapsilosis NOT DETECTED NOT DETECTED Final   Candida tropicalis NOT DETECTED NOT  DETECTED Final  Culture, blood (routine x 2)     Status: Abnormal   Collection Time: 12/15/16  9:48 AM  Result Value Ref Range Status   Specimen Description BLOOD BLOOD RIGHT HAND  Final   Special Requests IN PEDIATRIC BOTTLE 2.5CC  Final   Culture  Setup Time   Final    GRAM POSITIVE COCCI IN CLUSTERS IN PEDIATRIC BOTTLE CRITICAL VALUE NOTED.  VALUE IS CONSISTENT WITH PREVIOUSLY REPORTED AND CALLED VALUE.    Culture (A)  Final    STAPHYLOCOCCUS AUREUS SUSCEPTIBILITIES PERFORMED ON PREVIOUS CULTURE WITHIN THE LAST 5 DAYS.    Report Status 12/17/2016 FINAL  Final  Culture, blood (routine x 2)     Status: None (Preliminary result)   Collection Time: 12/15/16  9:48 AM  Result Value Ref Range Status   Specimen Description BLOOD BLOOD LEFT HAND  Final   Special Requests IN PEDIATRIC BOTTLE 1CC  Final   Culture NO GROWTH 4 DAYS  Final   Report Status PENDING  Incomplete  Culture, blood (routine x 2)     Status: None (Preliminary result)   Collection Time: 12/16/16  1:00 PM  Result Value Ref Range Status   Specimen Description BLOOD RIGHT HAND  Final   Special Requests IN PEDIATRIC BOTTLE  1.5 CC  Final   Culture NO GROWTH 3 DAYS  Final   Report Status PENDING  Incomplete  Culture, blood (routine x 2)     Status: None (Preliminary result)   Collection Time: 12/16/16  1:15 PM  Result Value Ref Range Status   Specimen Description BLOOD LEFT ARM  Final   Special Requests IN PEDIATRIC BOTTLE  1.5  Final   Culture NO GROWTH 3 DAYS  Final   Report Status PENDING  Incomplete         Radiology Studies: Mr Hip Left W Wo Contrast  Result Date: 12/18/2016 CLINICAL DATA:  Patient admitted to the  hospital 12/13/2016 with bacteremia. Severe left hip pain. No known injury. History of IV drug abuse. EXAM: MRI OF THE LEFT HIP WITHOUT AND WITH CONTRAST TECHNIQUE: Multiplanar, multisequence MR imaging was performed both before and after administration of intravenous contrast. CONTRAST:  15 cc MultiHance IV. COMPARISON:  Plain films left hip 12/13/2016. FINDINGS: Bones: There is marrow edema and enhancement about the left sacroiliac joint most consistent with osteomyelitis. Marrow signal is diffusely heterogeneous as can be seen in cigarette smoking or obesity. There is no avascular necrosis of the femoral heads. No fracture. Articular cartilage and labrum Articular cartilage:  Normal. Labrum:  Intact. Joint or bursal effusion Joint effusion:  Small left SI joint effusion is seen. Bursae:  Negative. Muscles and tendons Muscles and tendons: Intact. There is some edema and enhancement in musculature about the left SI joint including the iliacus and gluteus medius. No intramuscular fluid collection is identified although visualization is somewhat limited on this hip MRI. Other findings Miscellaneous: Imaged intrapelvic contents demonstrate no acute abnormality. IMPRESSION: Findings most consistent with a septic SI joint on the left with surrounding osteomyelitis and myositis. No abscess is identified. Electronically Signed   By: Inge Rise M.D.   On: 12/18/2016 08:22        Scheduled Meds: .  ceFAZolin (ANCEF) IV  2 g Intravenous Q8H  . docusate sodium  100 mg Oral BID  . enoxaparin (LOVENOX) injection  40 mg Subcutaneous Q24H  . methocarbamol  1,000 mg Oral QID  . nicotine  21 mg Transdermal Daily  . pregabalin  75 mg Oral BID  . sodium chloride flush  3 mL Intravenous Q12H   Continuous Infusions:    LOS: 5 days    Adelise Buswell Tanna Furry, MD [facility] Pager [phone]  If 7PM-7AM, please contact night-coverage [email or web address] Password [facility] 12/19/2016, 3:56 PM

## 2016-12-19 NOTE — Progress Notes (Signed)
Patient ID: Holly Weber, female   DOB: Jan 30, 1966, 51 y.o.   MRN: [id number]         [facility] for Infectious Disease  Date of Admission:  12/13/2016   Total days of antibiotics 6         Principal Problem:   Staphylococcus aureus bacteremia Active Problems:   Pneumonia   Sacroiliitis ([facility])   Cocaine abuse   IV drug user   Tobacco abuse   HTN (hypertension)   Hyperglycemia   Normocytic anemia   .  ceFAZolin (ANCEF) IV  2 g Intravenous Q8H  . docusate sodium  100 mg Oral BID  . enoxaparin (LOVENOX) injection  40 mg Subcutaneous Q24H  . methocarbamol  1,000 mg Oral QID  . nicotine  21 mg Transdermal Daily  . pregabalin  75 mg Oral BID  . sodium chloride flush  3 mL Intravenous Q12H    SUBJECTIVE: She tells me that her nurses are being mean to her and will not give her pain medication.  Review of Systems: Review of Systems  Constitutional: Negative for chills, diaphoresis and fever.  Gastrointestinal: Negative for abdominal pain, diarrhea, nausea and vomiting.  Musculoskeletal: Positive for back pain and joint pain.  Psychiatric/Behavioral: Positive for substance abuse.    Past Medical History:  Diagnosis Date  . Colitis, ulcerative ([place])   . Depression   . Hepatitis C   . Hypertension   . Stroke ([place])   . Thyroid disease     Social History  Substance Use Topics  . Smoking status: Current Every Day Smoker    Types: Cigarettes  . Smokeless tobacco: Never Used  . Alcohol use No    History reviewed. No pertinent family history. Allergies  Allergen Reactions  . Asa [Aspirin] Other (See Comments)    Ulcerative colitis flares.  . Nsaids Other (See Comments)    Ulcerative colitis flares  . Penicillins Rash and Other (See Comments)    Has patient had a PCN reaction causing immediate rash, facial/tongue/throat swelling, SOB or lightheadedness with hypotension:unsure Has patient had a PCN reaction causing severe rash involving mucus membranes or skin  necrosis:unsure Has patient had a PCN reaction that required hospitalization:No Has patient had a PCN reaction occurring within the last 10 years:Yes If all of the above answers are "NO", then may proceed with Cephalosporin use.     OBJECTIVE: Vitals:   12/18/16 0538 12/18/16 1407 12/18/16 2207 12/19/16 0648  BP: (!) 142/80 140/81 (!) 167/82 (!) 164/91  Pulse: 85 90 (!) 101 96  Resp: 18 18 18 18   Temp: 98.7 F (37.1 C) 98.6 F (37 C) 98.7 F (37.1 C) 99.5 F (37.5 C)  TempSrc:  Oral Oral Oral  SpO2: 97% 99% 93% 96%  Weight:      Height:       Body mass index is 26.64 kg/m.  Physical Exam  Constitutional:  She is tearful.  Cardiovascular: Normal rate and regular rhythm.   No murmur heard. Pulmonary/Chest: Effort normal and breath sounds normal.  Abdominal: Soft. There is no tenderness.  Skin:  New PICC in place.    Lab Results Lab Results  Component Value Date   WBC 6.7 12/18/2016   HGB 12.3 12/18/2016   HCT 37.4 12/18/2016   MCV 77.6 (L) 12/18/2016   PLT 203 12/18/2016    Lab Results  Component Value Date   CREATININE 0.55 12/18/2016   BUN 10 12/18/2016   NA 132 (L) 12/18/2016   K 4.0  12/18/2016   CL 96 (L) 12/18/2016   CO2 28 12/18/2016    Lab Results  Component Value Date   ALT 16 12/13/2016   AST 19 12/13/2016   ALKPHOS 110 12/13/2016   BILITOT 1.6 (H) 12/13/2016    No results found for: ESRSEDRATE, CRP   Hepatitis C viral load 7,720,000; genotype 1A  Microbiology: Recent Results (from the past 240 hour(s))  Urine culture     Status: Abnormal   Collection Time: 12/13/16  8:34 AM  Result Value Ref Range Status   Specimen Description URINE, CLEAN CATCH  Final   Special Requests NONE  Final   Culture <10,000 COLONIES/mL INSIGNIFICANT GROWTH (A)  Final   Report Status 12/14/2016 FINAL  Final  Culture, blood (x 2)     Status: Abnormal   Collection Time: 12/13/16 11:22 PM  Result Value Ref Range Status   Specimen Description BLOOD LEFT  ANTECUBITAL  Final   Special Requests BOTTLES DRAWN AEROBIC AND ANAEROBIC 5CC  Final   Culture  Setup Time   Final    GRAM POSITIVE COCCI IN CLUSTERS IN BOTH AEROBIC AND ANAEROBIC BOTTLES CRITICAL RESULT CALLED TO, READ BACK BY AND VERIFIED WITH: Hughie Closs, [place], 12/14/16 AT 37 BY J FUDESCO    Culture STAPHYLOCOCCUS AUREUS (A)  Final   Report Status 12/16/2016 FINAL  Final   Organism ID, Bacteria STAPHYLOCOCCUS AUREUS  Final      Susceptibility   Staphylococcus aureus - MIC*    CIPROFLOXACIN 2 INTERMEDIATE Intermediate     ERYTHROMYCIN RESISTANT Resistant     GENTAMICIN <=0.5 SENSITIVE Sensitive     OXACILLIN 0.5 SENSITIVE Sensitive     TETRACYCLINE <=1 SENSITIVE Sensitive     VANCOMYCIN <=0.5 SENSITIVE Sensitive     TRIMETH/SULFA <=10 SENSITIVE Sensitive     CLINDAMYCIN RESISTANT Resistant     RIFAMPIN <=0.5 SENSITIVE Sensitive     Inducible Clindamycin POSITIVE Resistant     * STAPHYLOCOCCUS AUREUS  Culture, blood (x 2)     Status: Abnormal   Collection Time: 12/13/16 11:22 PM  Result Value Ref Range Status   Specimen Description BLOOD LEFT ANTECUBITAL  Final   Special Requests BOTTLES DRAWN AEROBIC AND ANAEROBIC 5CC  Final   Culture  Setup Time   Final    GRAM POSITIVE COCCI IN CLUSTERS IN BOTH AEROBIC AND ANAEROBIC BOTTLES CRITICAL VALUE NOTED.  VALUE IS CONSISTENT WITH PREVIOUSLY REPORTED AND CALLED VALUE.    Culture (A)  Final    STAPHYLOCOCCUS AUREUS SUSCEPTIBILITIES PERFORMED ON PREVIOUS CULTURE WITHIN THE LAST 5 DAYS.    Report Status 12/16/2016 FINAL  Final  Blood Culture ID Panel (Reflexed)     Status: Abnormal   Collection Time: 12/13/16 11:22 PM  Result Value Ref Range Status   Enterococcus species NOT DETECTED NOT DETECTED Final   Listeria monocytogenes NOT DETECTED NOT DETECTED Final   Staphylococcus species DETECTED (A) NOT DETECTED Final    Comment: CRITICAL RESULT CALLED TO, READ BACK BY AND VERIFIED WITH: Romero Belling [id number] AT 1711 BY J FUDESC     Staphylococcus aureus DETECTED (A) NOT DETECTED Final    Comment: Methicillin (oxacillin) susceptible Staphylococcus aureus (MSSA). Preferred therapy is anti staphylococcal beta lactam antibiotic (Cefazolin or Nafcillin), unless clinically contraindicated. CRITICAL RESULT CALLED TO, READ BACK BY AND VERIFIED WITH: Hughie Closs, PHARM, 12/14/16 AT 1711 BY J FUDESCO    Methicillin resistance NOT DETECTED NOT DETECTED Final   Streptococcus species NOT DETECTED NOT DETECTED Final   Streptococcus agalactiae  NOT DETECTED NOT DETECTED Final   Streptococcus pneumoniae NOT DETECTED NOT DETECTED Final   Streptococcus pyogenes NOT DETECTED NOT DETECTED Final   Acinetobacter baumannii NOT DETECTED NOT DETECTED Final   Enterobacteriaceae species NOT DETECTED NOT DETECTED Final   Enterobacter cloacae complex NOT DETECTED NOT DETECTED Final   Escherichia coli NOT DETECTED NOT DETECTED Final   Klebsiella oxytoca NOT DETECTED NOT DETECTED Final   Klebsiella pneumoniae NOT DETECTED NOT DETECTED Final   Proteus species NOT DETECTED NOT DETECTED Final   Serratia marcescens NOT DETECTED NOT DETECTED Final   Haemophilus influenzae NOT DETECTED NOT DETECTED Final   Neisseria meningitidis NOT DETECTED NOT DETECTED Final   Pseudomonas aeruginosa NOT DETECTED NOT DETECTED Final   Candida albicans NOT DETECTED NOT DETECTED Final   Candida glabrata NOT DETECTED NOT DETECTED Final   Candida krusei NOT DETECTED NOT DETECTED Final   Candida parapsilosis NOT DETECTED NOT DETECTED Final   Candida tropicalis NOT DETECTED NOT DETECTED Final  Culture, blood (routine x 2)     Status: Abnormal   Collection Time: 12/15/16  9:48 AM  Result Value Ref Range Status   Specimen Description BLOOD BLOOD RIGHT HAND  Final   Special Requests IN PEDIATRIC BOTTLE 2.5CC  Final   Culture  Setup Time   Final    GRAM POSITIVE COCCI IN CLUSTERS IN PEDIATRIC BOTTLE CRITICAL VALUE NOTED.  VALUE IS CONSISTENT WITH PREVIOUSLY REPORTED AND  CALLED VALUE.    Culture (A)  Final    STAPHYLOCOCCUS AUREUS SUSCEPTIBILITIES PERFORMED ON PREVIOUS CULTURE WITHIN THE LAST 5 DAYS.    Report Status 12/17/2016 FINAL  Final  Culture, blood (routine x 2)     Status: None (Preliminary result)   Collection Time: 12/15/16  9:48 AM  Result Value Ref Range Status   Specimen Description BLOOD BLOOD LEFT HAND  Final   Special Requests IN PEDIATRIC BOTTLE 1CC  Final   Culture NO GROWTH 3 DAYS  Final   Report Status PENDING  Incomplete  Culture, blood (routine x 2)     Status: None (Preliminary result)   Collection Time: 12/16/16  1:00 PM  Result Value Ref Range Status   Specimen Description BLOOD RIGHT HAND  Final   Special Requests IN PEDIATRIC BOTTLE  1.5 CC  Final   Culture NO GROWTH 2 DAYS  Final   Report Status PENDING  Incomplete  Culture, blood (routine x 2)     Status: None (Preliminary result)   Collection Time: 12/16/16  1:15 PM  Result Value Ref Range Status   Specimen Description BLOOD LEFT ARM  Final   Special Requests IN PEDIATRIC BOTTLE  1.5  Final   Culture NO GROWTH 2 DAYS  Final   Report Status PENDING  Incomplete     ASSESSMENT: She has MSSA bacteremia and left sacroiliitis with adjacent osteomyelitis. I plan on 6 weeks of IV cefazolin.  She also has chronic hepatitis C.  PLAN: 1. Continue cefazolin 2. SNF placement 3. I will arrange follow-up in my clinic in one month 4. I will sign off now  Diagnosis: MSSA bacteremia and left sacroiliitis  Culture Result: MSSA  Allergies  Allergen Reactions  . Asa [Aspirin] Other (See Comments)    Ulcerative colitis flares.  . Nsaids Other (See Comments)    Ulcerative colitis flares  . Penicillins Rash and Other (See Comments)    Has patient had a PCN reaction causing immediate rash, facial/tongue/throat swelling, SOB or lightheadedness with hypotension:unsure Has patient had a PCN  reaction causing severe rash involving mucus membranes or skin necrosis:unsure Has  patient had a PCN reaction that required hospitalization:No Has patient had a PCN reaction occurring within the last 10 years:Yes If all of the above answers are "NO", then may proceed with Cephalosporin use.     Discharge antibiotics: Per pharmacy protocol Cefazolin  Duration: 6 weeks End Date: 01/25/2017  [facility] Care Per Protocol:  Labs weekly while on IV antibiotics: _x_ CBC with differential _x_ BMP __ CMP _x_ CRP _x_ ESR __ Vancomycin trough  __ Please pull PIC at completion of IV antibiotics _x_ Please leave PIC in place until doctor has seen patient or been notified  Fax weekly labs to [phone]  Clinic Follow Up Appt:  I will arrange follow-up in my clinic in one month  Michel Bickers, MD [facility] for [place] [phone] pager   [phone] cell 12/19/2016, 1:05 PM

## 2016-12-19 NOTE — Progress Notes (Signed)
12/19/2016 Pt had call light on and upon entering pt showed that the IV fluids was disconnected from her PICC line. Got new IV line and hung new IVF with PICC, about two hours later pt again had the PICC line disconnected from the IV fluids, and was stating she did not get the IV pain meds which were given via IV. I explained I was unable to give her more of the the IV pain meds until they were due according to the time on the computer. Pt was very unhappy with this explanation and has threatened to go AMA.

## 2016-12-19 NOTE — Progress Notes (Signed)
qPhysical Therapy Treatment Patient Details Name: Holly Weber MRN: [id number] DOB: 1965/10/20 Today's Date: 12/19/2016    History of Present Illness Patient is a 51 yo female admitted 12/13/16 with severe back pain with Lt sided sciatica, HTN, developed fever, pna.    PMH:  Hep C, depression, CVA, cocaine abuse, tobacco use    PT Comments    Pt performed limited mobility during session secondary to pain.  On arrival pt in bed with PICC line disconnected.  Informed nursing of finding.  Pt reporting at times that she wants to leave AMA then requesting a Dr. Malachi Bonds on ice.  One minute patient crying in pain then laughing and joking.  Difficult to assess cognition.    Follow Up Recommendations  Home health PT;Supervision for mobility/OOB     Equipment Recommendations       Recommendations for Other Services       Precautions / Restrictions Precautions Precautions: Fall Restrictions Weight Bearing Restrictions: No    Mobility  Bed Mobility Overal bed mobility: Needs Assistance Bed Mobility: Supine to Sit;Sit to Supine     Supine to sit: Supervision Sit to supine: Supervision   General bed mobility comments: Extremely slow to perform bed mobility secondary to pain.  pt able to progress mobility without assistance.  Intermittently crying and laughing.    Transfers Overall transfer level: Needs assistance Equipment used: Rolling walker (2 wheeled);None (Pt performed mobility with RW without assistance.  Pt then performed stand pivot from bed to [facility] requiring min assist as pain increased.  ) Transfers: Sit to/from Stand Sit to Stand: Min guard;Supervision         General transfer comment: Pt required cues for hand placement to push from seated surface.  Pt with poor safety standing up with pants down and attempting to walk with her pants down.  Pt also began to turn and sit on the front cross bar of her RW after washing her hands.  Pt requires max VCs for safety.     Ambulation/Gait Ambulation/Gait assistance: Supervision Ambulation Distance (Feet):  (15 ft x2.  To and from bathroom.  Pt refused further gait training due to pain.  Pt also perseverating on needing more pain meds.  ) Assistive device: Rolling walker (2 wheeled) Gait Pattern/deviations: Step-through pattern;Decreased stride length;Antalgic     General Gait Details: Pt remains to present with slow antalgic gait with heavy reliance on RW for support due to pain.     Stairs            Wheelchair Mobility    Modified Rankin (Stroke Patients Only)       Balance Overall balance assessment: Needs assistance   Sitting balance-Leahy Scale: Good       Standing balance-Leahy Scale: Poor                              Cognition Arousal/Alertness: Awake/alert Behavior During Therapy: WFL for tasks assessed/performed Overall Cognitive Status: Within Functional Limits for tasks assessed                                        Exercises      General Comments        Pertinent Vitals/Pain Pain Assessment: 0-10 Pain Score: 6  Pain Location: Back, Lt hip and Lt leg Pain Descriptors / Indicators: Sharp Pain Intervention(s): Limited  activity within patient's tolerance    Home Living                      Prior Function            PT Goals (current goals can now be found in the care plan section) Acute Rehab PT Goals Patient Stated Goal: To decrease pain Potential to Achieve Goals: Good Progress towards PT goals: Progressing toward goals    Frequency    Min 3X/week      PT Plan Current plan remains appropriate    Co-evaluation             End of Session Equipment Utilized During Treatment: Gait belt Activity Tolerance: Patient limited by pain Patient left: with call bell/phone within reach;in bed;with bed alarm set Nurse Communication: Mobility status;Patient requests pain meds (Informed nursing that PICC line was  disconnected and pump was still running.  ) PT Visit Diagnosis: Other abnormalities of gait and mobility (R26.89);Pain Pain - Right/Left: Left Pain - part of body: Leg (back)     Time: 2376-2831 PT Time Calculation (min) (ACUTE ONLY): 41 min  Charges:  $Gait Training: 8-22 mins $Therapeutic Activity: 23-37 mins                    G Codes:       Governor Rooks, PTA pager [phone]    Cristela Blue 12/19/2016, 4:09 PM

## 2016-12-19 NOTE — Progress Notes (Signed)
CSW consulted for SNF placement for IV antibiotics-  Patient to be placed on difficult to place list to await placement when bed available- no bed open at this time  CSW will continue to follow  Jorge Ny, [place] Worker [phone]

## 2016-12-19 NOTE — NC FL2 (Signed)
[place] LEVEL OF CARE SCREENING TOOL     IDENTIFICATION  Patient Name: Holly Weber Birthdate: [birth year] Sex: female Admission Date (Current Location): 12/13/2016  [facility] and [place] Number:  Herbalist and Address:  [facility] [place], [place] [address], [place], [place] [address]      Provider Number: [id number]  Attending Physician Name and Address:  Rosita Fire, MD  Relative Name and Phone Number:       Current Level of Care: Hospital Recommended Level of Care: [place] Prior Approval Number:    Date Approved/Denied:   PASRR Number: [id number]  Discharge Plan: SNF    Current Diagnoses: Patient Active Problem List   Diagnosis Date Noted  . Sacroiliitis ([place])   . Staphylococcus aureus bacteremia 12/15/2016  . Pneumonia 12/15/2016  . Hyperglycemia 12/15/2016  . Normocytic anemia 12/15/2016  . IV drug user 12/15/2016  . Cocaine abuse 12/13/2016  . Tobacco abuse 12/13/2016  . HTN (hypertension) 12/13/2016    Orientation RESPIRATION BLADDER Height & Weight     Self, Time, Situation, Place  Normal Continent Weight: 160 lb 1.6 oz (72.6 kg) Height:  5' 5"  (165.1 cm)  BEHAVIORAL SYMPTOMS/MOOD NEUROLOGICAL BOWEL NUTRITION STATUS      Continent Diet (cardiac)  AMBULATORY STATUS COMMUNICATION OF NEEDS Skin   Limited Assist Verbally Normal                       Personal Care Assistance Level of Assistance  Bathing, Dressing Bathing Assistance: Limited assistance   Dressing Assistance: Limited assistance     Functional Limitations Info             SPECIAL CARE FACTORS FREQUENCY  PT (By licensed PT), OT (By licensed OT)     PT Frequency: 3/wk OT Frequency: 3/wk            Contractures      Additional Factors Info  Code Status, Allergies Code Status Info: FULL Allergies Info: [place] Aspirin, Nsaids, Penicillins           Current Medications (12/19/2016):  This is the  current hospital active medication list Current Facility-Administered Medications  Medication Dose Route Frequency Provider Last Rate Last Dose  . acetaminophen (TYLENOL) tablet 650 mg  650 mg Oral Q6H PRN Maren Reamer, MD   650 mg at 12/17/16 1600   Or  . acetaminophen (TYLENOL) suppository 650 mg  650 mg Rectal Q6H PRN Maren Reamer, MD      . bisacodyl (DULCOLAX) suppository 10 mg  10 mg Rectal Daily PRN Maren Reamer, MD      . ceFAZolin (ANCEF) IVPB 2g/100 mL premix  2 g Intravenous Q8H Michel Bickers, MD   2 g at 12/19/16 1301  . docusate sodium (COLACE) capsule 100 mg  100 mg Oral BID Maren Reamer, MD   100 mg at 12/19/16 0914  . enoxaparin (LOVENOX) injection 40 mg  40 mg Subcutaneous Q24H Maren Reamer, MD   40 mg at 12/18/16 2148  . hydrALAZINE (APRESOLINE) injection 10 mg  10 mg Intravenous Q6H PRN Dron Tanna Furry, MD   10 mg at 12/17/16 1627  . HYDROcodone-acetaminophen (NORCO/VICODIN) 5-325 MG per tablet 1-2 tablet  1-2 tablet Oral Q4H PRN Maren Reamer, MD   2 tablet at 12/19/16 1328  . HYDROmorphone (DILAUDID) injection 1 mg  1 mg Intravenous Q4H PRN Dron Tanna Furry, MD   1 mg at  12/19/16 1440  . LORazepam (ATIVAN) tablet 1 mg  1 mg Oral BID BM & HS PRN Jeryl Columbia, NP   1 mg at 12/19/16 1440  . methocarbamol (ROBAXIN) tablet 1,000 mg  1,000 mg Oral QID Lisette Abu, PA-C   1,000 mg at 12/19/16 1328  . nicotine (NICODERM CQ - dosed in mg/24 hours) patch 21 mg  21 mg Transdermal Daily Maren Reamer, MD   21 mg at 12/19/16 0917  . ondansetron (ZOFRAN) tablet 4 mg  4 mg Oral Q6H PRN Maren Reamer, MD       Or  . ondansetron (ZOFRAN) injection 4 mg  4 mg Intravenous Q6H PRN Maren Reamer, MD   4 mg at 12/13/16 2100  . pregabalin (LYRICA) capsule 75 mg  75 mg Oral BID Lisette Abu, PA-C   75 mg at 12/19/16 9611  . senna-docusate (Senokot-S) tablet 1 tablet  1 tablet Oral QHS PRN Maren Reamer, MD      . sodium chloride flush  (NS) 0.9 % injection 10-40 mL  10-40 mL Intracatheter PRN Dron Tanna Furry, MD      . sodium chloride flush (NS) 0.9 % injection 3 mL  3 mL Intravenous Q12H Maren Reamer, MD   3 mL at 12/17/16 2150     Discharge Medications: Please see discharge summary for a list of discharge medications.  Relevant Imaging Results:  Relevant Lab Results:   Additional Information SS#: [id number], will need IV ancef through 01/25/17  Jorge Ny, LCSW

## 2016-12-19 NOTE — Progress Notes (Signed)
Patient found smoking in the bathroom and unwilling to give up any extra cigarettes or lighter that she has.  Patient stated that she is read for discharge and would leave AMA if we were unable to find her a place to discharge to.  SW made aware and is working on discharge needs currently.  MD Carolin Sicks is aware and we will continue to monitor the patient and her behavior.

## 2016-12-20 LAB — CULTURE, BLOOD (ROUTINE X 2): Culture: NO GROWTH

## 2016-12-20 LAB — CREATININE, SERUM
Creatinine, Ser: 0.57 mg/dL (ref 0.44–1.00)
GFR calc Af Amer: >60 mL/min
GFR calc non Af Amer: >60 mL/min

## 2016-12-20 LAB — C-REACTIVE PROTEIN: CRP: 4.6 mg/dL — ABNORMAL HIGH

## 2016-12-20 LAB — SEDIMENTATION RATE: Sed Rate: 81 mm/h — ABNORMAL HIGH (ref 0–22)

## 2016-12-20 MED ORDER — HYDROMORPHONE HCL 1 MG/ML IJ SOLN
0.5000 mg | Freq: Once | INTRAMUSCULAR | Status: AC
  Administered 2016-12-20: 0.5 mg via INTRAVENOUS
  Filled 2016-12-20: qty 1

## 2016-12-20 NOTE — Progress Notes (Signed)
   Subjective: 3 Days Post-Op Procedure(s) (LRB): TRANSESOPHAGEAL ECHOCARDIOGRAM (TEE) (N/A)  Pt sitting in bed with legs flexed with no increased pain c/o continued mild pain to low back and hip area  Denies any new symptoms or issues Patient reports pain as mild.  Objective:   VITALS:   Vitals:   12/19/16 2044 12/20/16 0523  BP: 129/74 136/85  Pulse: 91 86  Resp: 19 18  Temp: 98.2 F (36.8 C) 97.4 F (36.3 C)    Lumbar spine with no tenderness  Good rom of bilateral hips knee and legs nv intact distally No rashes or edema  LABS  Recent Labs  12/18/16 0619  HGB 12.3  HCT 37.4  WBC 6.7  PLT 203     Recent Labs  12/18/16 0619 12/20/16 0526  NA 132*  --   K 4.0  --   BUN 10  --   CREATININE 0.55 0.57  GLUCOSE 147*  --      Assessment/Plan: 3 Days Post-Op Procedure(s) (LRB): TRANSESOPHAGEAL ECHOCARDIOGRAM (TEE) (N/A) Stable from an orthopedic standpoint No need for any surgical intervention Continue with antibiotics f/u as needed    Merla Riches, MPAS, PA-C  12/20/2016, 10:42 AM

## 2016-12-20 NOTE — Progress Notes (Signed)
12/20/2016  MD discontinued patients prn IV dilaudid today. Patient had multiple other pain medications prn for pain and MD said they should be able to cover patients pain issues. Several hours later patient asked for a list of all her pain medications, and then called the nurses desk and said she wanted to talk with the manager because her dilaudid was not replaced with another pain medication. Called Dr Carolin Sicks and he gave a verbal order of a one time dose of 0.30m dilaudid IV, and he would address the issue in the morning. Went into the patients room and said you have a one time dose of dilaudid iv to be given to you per Dr BFerdinand Langoorders and he will address the issue tomorrow morning, would you like this dose now or later this evening. Pt opted to take the dilaudid with her ativan immediately. Medication was given per patients request.

## 2016-12-20 NOTE — Progress Notes (Signed)
PROGRESS NOTE    Holly Weber  YYT:[id number] DOB: [birth year] DOA: 12/13/2016 PCP: No PCP Per Patient   Brief Narrative: 51 year old female with ulcerative colitis, hep C, depression, history of stroke, cocaine abuse (last use was about 4 weeks ago. IV), ongoing tobacco use presented with progressive worsening lower back pain radiating down to the left leg with some numbness since 2 days prior to admission. Denied any bowel or urinary incontinence. Patient reports having significant difficulty to move around or amblyopia. Denies fevers, chills, vomiting, chest pain, palpitations, loss of consciousness, shortness of breath or abdominal pain. In the ED she was septic with fever of 101.30F, tachycardic, tachypneic and WBC of 16.5 K. CT and MRI of the lumbar spine was unremarkable for stenosis, discitis or abscess. Chest x-ray concerning for bilateral infiltrate. Blood cx from admission growing MSSA in both bottles. Admitted to hospitalist service for sepsis.  Assessment & Plan:  # Sepsis due to MSSA bacteremia:  - TEE with no vegetation.  -Planned for 6 weeks of IV cefazolin as per Dr. Megan Salon. Patient needs skilled nursing facility for IV antibiotics. Social worker is following. Patient is difficult to place at [facility]. -follow up repeat cultures from 3/27, no growth to date.  # Back pain with left sided sciatica:  -CT/MRI if spine showed no acute findings. MRI of left hip obtained because of severe pain. 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 Currently on IV antibiotics.  -Pain is better controlled with current regimen. Education provided to the patient regarding minimizing Norcotics use. Continue bowel regimen. No surgical intervention as per orthopedics.  DC IV dilaudid today.  -Continue to have therapy growing PT OT.  # Polysubstance abuse: Urine toxicology positive for cocaine and THC. Patient was educated at bedside today.  #Hypokalemia,  hyponatremia: Electrolyte repleted. Potassium improved. Monitor labs. Encourage oral intake.  #  Hypertension: monitor BP.  # Tobacco abuse: continue nicotine patch.   Principal Problem:   Staphylococcus aureus bacteremia Active Problems:   Cocaine abuse   Tobacco abuse   HTN (hypertension)   Pneumonia   Hyperglycemia   Normocytic anemia   IV drug user   Sacroiliitis ([place])  DVT prophylaxis:lovenox sq Code Status:full Family Communication:no family at bedside Disposition Plan:likely dc to SNF vs home depending on clinical improvement and if pt requires IV abx.    Consultants:   ID  Cards for TEE  Ortho consult  Procedures:TEE on 3/28 Antimicrobials:cefazolin since 3/26 Vancomycin 3/24-3/26  Subjective: Patient was seen and examined at bedside. Reported pain is better. Denied nausea vomiting chest pain or shortness of breath. Objective: Vitals:   12/19/16 0648 12/19/16 1340 12/19/16 2044 12/20/16 0523  BP: (!) 164/91 (!) 164/95 129/74 136/85  Pulse: 96 89 91 86  Resp: 18 20 19 18   Temp: 99.5 F (37.5 C) 97.7 F (36.5 C) 98.2 F (36.8 C) 97.4 F (36.3 C)  TempSrc: Oral Oral  Oral  SpO2: 96% 98% 98% 99%  Weight:      Height:        Intake/Output Summary (Last 24 hours) at 12/20/16 1013 Last data filed at 12/20/16 0530  Gross per 24 hour  Intake               10 ml  Output              800 ml  Net             -790 ml   Filed  Weights   12/13/16 1826  Weight: 72.6 kg (160 lb 1.6 oz)    Examination:  General exam: Lying on bed comfortable, nondistressed Respiratory system: Clear bilateral. Respiratory effort normal. No wheezing or crackle Cardiovascular system: Regular rate rhythm, S1-S2 normal.  No pedal edema. Gastrointestinal system: Abdomen is nondistended, soft and nontender. Normal bowel sounds heard. Central nervous system: Alert Awake and following commands Skin: No rashes, lesions or ulcers Psychiatry: Judgement and insight appear normal.  Mood & affect appropriate.     Data Reviewed: I have personally reviewed following labs and imaging studies  CBC:  Recent Labs Lab 12/13/16 1718 12/14/16 0117 12/17/16 0647 12/18/16 0619  WBC 16.5* 12.4* 6.7 6.7  NEUTROABS  --  10.0*  --   --   HGB 14.0 11.7* 12.3 12.3  HCT 41.8 35.7* 36.7 37.4  MCV 79.9 79.9 78.1 77.6*  PLT 164 154 180 250   Basic Metabolic Panel:  Recent Labs Lab 12/13/16 1718 12/13/16 2322 12/15/16 0529 12/17/16 0647 12/18/16 0619 12/20/16 0526  NA  --  130* 131* 131* 132*  --   K  --  3.3* 3.5 3.7 4.0  --   CL  --  93* 96* 92* 96*  --   CO2  --  24 27 27 28   --   GLUCOSE  --  182* 119* 115* 147*  --   BUN  --  7 8 9 10   --   CREATININE 0.59 0.75 0.68 0.54 0.55 0.57  CALCIUM  --  8.8* 8.5* 8.4* 8.4*  --   MG 1.6*  --   --   --   --   --    GFR: Estimated Creatinine Clearance: 84.9 mL/min (by C-G formula based on SCr of 0.57 mg/dL). Liver Function Tests:  Recent Labs Lab 12/13/16 2322  AST 19  ALT 16  ALKPHOS 110  BILITOT 1.6*  PROT 7.2  ALBUMIN 3.0*   No results for input(s): LIPASE, AMYLASE in the last 168 hours. No results for input(s): AMMONIA in the last 168 hours. Coagulation Profile:  Recent Labs Lab 12/13/16 2322  INR 1.14   Cardiac Enzymes: No results for input(s): CKTOTAL, CKMB, CKMBINDEX, TROPONINI in the last 168 hours. BNP (last 3 results) No results for input(s): PROBNP in the last 8760 hours. HbA1C: No results for input(s): HGBA1C in the last 72 hours. CBG: No results for input(s): GLUCAP in the last 168 hours. Lipid Profile: No results for input(s): CHOL, HDL, LDLCALC, TRIG, CHOLHDL, LDLDIRECT in the last 72 hours. Thyroid Function Tests: No results for input(s): TSH, T4TOTAL, FREET4, T3FREE, THYROIDAB in the last 72 hours. Anemia Panel: No results for input(s): VITAMINB12, FOLATE, FERRITIN, TIBC, IRON, RETICCTPCT in the last 72 hours. Sepsis Labs:  Recent Labs Lab 12/13/16 2322 12/14/16 0117    PROCALCITON 0.15  --   LATICACIDVEN 1.5 0.7    Recent Results (from the past 240 hour(s))  Urine culture     Status: Abnormal   Collection Time: 12/13/16  8:34 AM  Result Value Ref Range Status   Specimen Description URINE, CLEAN CATCH  Final   Special Requests NONE  Final   Culture <10,000 COLONIES/mL INSIGNIFICANT GROWTH (A)  Final   Report Status 12/14/2016 FINAL  Final  Culture, blood (x 2)     Status: Abnormal   Collection Time: 12/13/16 11:22 PM  Result Value Ref Range Status   Specimen Description BLOOD LEFT ANTECUBITAL  Final   Special Requests BOTTLES DRAWN AEROBIC AND ANAEROBIC 5CC  Final   Culture  Setup Time   Final    GRAM POSITIVE COCCI IN CLUSTERS IN BOTH AEROBIC AND ANAEROBIC BOTTLES CRITICAL RESULT CALLED TO, READ BACK BY AND VERIFIED WITH: Hughie Closs, [place], 12/14/16 AT 1711 BY J FUDESCO    Culture STAPHYLOCOCCUS AUREUS (A)  Final   Report Status 12/16/2016 FINAL  Final   Organism ID, Bacteria STAPHYLOCOCCUS AUREUS  Final      Susceptibility   Staphylococcus aureus - MIC*    CIPROFLOXACIN 2 INTERMEDIATE Intermediate     ERYTHROMYCIN RESISTANT Resistant     GENTAMICIN <=0.5 SENSITIVE Sensitive     OXACILLIN 0.5 SENSITIVE Sensitive     TETRACYCLINE <=1 SENSITIVE Sensitive     VANCOMYCIN <=0.5 SENSITIVE Sensitive     TRIMETH/SULFA <=10 SENSITIVE Sensitive     CLINDAMYCIN RESISTANT Resistant     RIFAMPIN <=0.5 SENSITIVE Sensitive     Inducible Clindamycin POSITIVE Resistant     * STAPHYLOCOCCUS AUREUS  Culture, blood (x 2)     Status: Abnormal   Collection Time: 12/13/16 11:22 PM  Result Value Ref Range Status   Specimen Description BLOOD LEFT ANTECUBITAL  Final   Special Requests BOTTLES DRAWN AEROBIC AND ANAEROBIC 5CC  Final   Culture  Setup Time   Final    GRAM POSITIVE COCCI IN CLUSTERS IN BOTH AEROBIC AND ANAEROBIC BOTTLES CRITICAL VALUE NOTED.  VALUE IS CONSISTENT WITH PREVIOUSLY REPORTED AND CALLED VALUE.    Culture (A)  Final    STAPHYLOCOCCUS  AUREUS SUSCEPTIBILITIES PERFORMED ON PREVIOUS CULTURE WITHIN THE LAST 5 DAYS.    Report Status 12/16/2016 FINAL  Final  Blood Culture ID Panel (Reflexed)     Status: Abnormal   Collection Time: 12/13/16 11:22 PM  Result Value Ref Range Status   Enterococcus species NOT DETECTED NOT DETECTED Final   Listeria monocytogenes NOT DETECTED NOT DETECTED Final   Staphylococcus species DETECTED (A) NOT DETECTED Final    Comment: CRITICAL RESULT CALLED TO, READ BACK BY AND VERIFIED WITH: Romero Belling [id number] AT 1711 BY J FUDESC    Staphylococcus aureus DETECTED (A) NOT DETECTED Final    Comment: Methicillin (oxacillin) susceptible Staphylococcus aureus (MSSA). Preferred therapy is anti staphylococcal beta lactam antibiotic (Cefazolin or Nafcillin), unless clinically contraindicated. CRITICAL RESULT CALLED TO, READ BACK BY AND VERIFIED WITH: Hughie Closs, [facility], 12/14/16 AT 1711 BY J FUDESCO    Methicillin resistance NOT DETECTED NOT DETECTED Final   Streptococcus species NOT DETECTED NOT DETECTED Final   Streptococcus agalactiae NOT DETECTED NOT DETECTED Final   Streptococcus pneumoniae NOT DETECTED NOT DETECTED Final   Streptococcus pyogenes NOT DETECTED NOT DETECTED Final   Acinetobacter baumannii NOT DETECTED NOT DETECTED Final   Enterobacteriaceae species NOT DETECTED NOT DETECTED Final   Enterobacter cloacae complex NOT DETECTED NOT DETECTED Final   Escherichia coli NOT DETECTED NOT DETECTED Final   Klebsiella oxytoca NOT DETECTED NOT DETECTED Final   Klebsiella pneumoniae NOT DETECTED NOT DETECTED Final   Proteus species NOT DETECTED NOT DETECTED Final   Serratia marcescens NOT DETECTED NOT DETECTED Final   Haemophilus influenzae NOT DETECTED NOT DETECTED Final   Neisseria meningitidis NOT DETECTED NOT DETECTED Final   Pseudomonas aeruginosa NOT DETECTED NOT DETECTED Final   Candida albicans NOT DETECTED NOT DETECTED Final   Candida glabrata NOT DETECTED NOT DETECTED Final   Candida  krusei NOT DETECTED NOT DETECTED Final   Candida parapsilosis NOT DETECTED NOT DETECTED Final   Candida tropicalis NOT DETECTED NOT DETECTED Final  Culture,  blood (routine x 2)     Status: Abnormal   Collection Time: 12/15/16  9:48 AM  Result Value Ref Range Status   Specimen Description BLOOD BLOOD RIGHT HAND  Final   Special Requests IN PEDIATRIC BOTTLE 2.5CC  Final   Culture  Setup Time   Final    GRAM POSITIVE COCCI IN CLUSTERS IN PEDIATRIC BOTTLE CRITICAL VALUE NOTED.  VALUE IS CONSISTENT WITH PREVIOUSLY REPORTED AND CALLED VALUE.    Culture (A)  Final    STAPHYLOCOCCUS AUREUS SUSCEPTIBILITIES PERFORMED ON PREVIOUS CULTURE WITHIN THE LAST 5 DAYS.    Report Status 12/17/2016 FINAL  Final  Culture, blood (routine x 2)     Status: None (Preliminary result)   Collection Time: 12/15/16  9:48 AM  Result Value Ref Range Status   Specimen Description BLOOD BLOOD LEFT HAND  Final   Special Requests IN PEDIATRIC BOTTLE 1CC  Final   Culture NO GROWTH 4 DAYS  Final   Report Status PENDING  Incomplete  Culture, blood (routine x 2)     Status: None (Preliminary result)   Collection Time: 12/16/16  1:00 PM  Result Value Ref Range Status   Specimen Description BLOOD RIGHT HAND  Final   Special Requests IN PEDIATRIC BOTTLE  1.5 CC  Final   Culture NO GROWTH 3 DAYS  Final   Report Status PENDING  Incomplete  Culture, blood (routine x 2)     Status: None (Preliminary result)   Collection Time: 12/16/16  1:15 PM  Result Value Ref Range Status   Specimen Description BLOOD LEFT ARM  Final   Special Requests IN PEDIATRIC BOTTLE  1.5  Final   Culture NO GROWTH 3 DAYS  Final   Report Status PENDING  Incomplete         Radiology Studies: No results found.      Scheduled Meds: .  ceFAZolin (ANCEF) IV  2 g Intravenous Q8H  . docusate sodium  100 mg Oral BID  . enoxaparin (LOVENOX) injection  40 mg Subcutaneous Q24H  . methocarbamol  1,000 mg Oral QID  . nicotine  21 mg Transdermal  Daily  . polyethylene glycol  17 g Oral Daily  . pregabalin  75 mg Oral BID  . sodium chloride flush  3 mL Intravenous Q12H   Continuous Infusions:    LOS: 6 days    Dejuana Weist Tanna Furry, MD [facility] Pager [phone]  If 7PM-7AM, please contact night-coverage [email or web address] Password [facility] 12/20/2016, 10:13 AM

## 2016-12-21 LAB — CULTURE, BLOOD (ROUTINE X 2)
CULTURE: NO GROWTH
CULTURE: NO GROWTH

## 2016-12-21 MED ORDER — MORPHINE SULFATE (PF) 2 MG/ML IV SOLN: 1.0000 mg | INTRAVENOUS | Status: DC | PRN

## 2016-12-21 MED ORDER — MORPHINE SULFATE (PF) 2 MG/ML IV SOLN
1.0000 mg | Freq: Two times a day (BID) | INTRAVENOUS | Status: DC | PRN
  Administered 2016-12-21: 1 mg via INTRAVENOUS
  Filled 2016-12-21: qty 1

## 2016-12-21 MED ORDER — OXYCODONE-ACETAMINOPHEN 7.5-325 MG PO TABS
1.0000 | ORAL_TABLET | ORAL | Status: DC | PRN
  Administered 2016-12-21 (×3): 1 via ORAL
  Filled 2016-12-21 (×3): qty 1

## 2016-12-21 NOTE — Progress Notes (Signed)
PROGRESS NOTE    Holly Weber  GQQ:[id number] DOB: [birth year] DOA: 12/13/2016 PCP: No PCP Per Patient   Brief Narrative: 51 year old female with ulcerative colitis, hep C, depression, history of stroke, cocaine abuse (last use was about 4 weeks ago. IV), ongoing tobacco use presented with progressive worsening lower back pain radiating down to the left leg with some numbness since 2 days prior to admission. Denied any bowel or urinary incontinence. Patient reports having significant difficulty to move around or amblyopia. Denies fevers, chills, vomiting, chest pain, palpitations, loss of consciousness, shortness of breath or abdominal pain. In the ED she was septic with fever of 101.35F, tachycardic, tachypneic and WBC of 16.5 K. CT and MRI of the lumbar spine was unremarkable for stenosis, discitis or abscess. Chest x-ray concerning for bilateral infiltrate. Blood cx from admission growing MSSA in both bottles. Admitted to hospitalist service for sepsis.  Assessment & Plan:  # Sepsis due to MSSA bacteremia:  - TEE with no vegetation.  -Planned for 6 weeks of IV cefazolin as per Dr. Megan Salon. Patient needs skilled nursing facility for IV antibiotics. Social worker is following. Patient is difficult to place at [facility]. -follow up repeat cultures from 3/27, no growth to date.  # Back pain with left sided sciatica:  -CT/MRI if spine showed no acute findings. MRI of left hip obtained because of severe pain. 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 Currently on IV antibiotics. No surgical intervention as per orthopedics.  # Pain management/drug seeking behavior: The patient looks mostly comfortable but when we start talking about her symptoms she starts crying and asking for IV Dilaudid. Education provided to the patient. I discontinued hydrocodone and changed to Percocet oral. I discussed patient to minimize IV pain medications. On morphine 1 mg twice  a day as needed for breakthrough pain management. Continue Robaxin, Lyrica, Tylenol as needed. Continue to monitor. Continue PT OT evaluation and treatment.  # Polysubstance abuse: Urine toxicology positive for cocaine and THC. Patient was educated at bedside today. We will rescreen urine toxicology today. Discussed with the patient hours.  #Hypokalemia, hyponatremia: Electrolyte repleted. Potassium improved. Monitor labs. Encourage oral intake.  #  Hypertension: monitor BP.  # Tobacco abuse: continue nicotine patch.   Principal Problem:   Staphylococcus aureus bacteremia Active Problems:   Cocaine abuse   Tobacco abuse   HTN (hypertension)   Pneumonia   Hyperglycemia   Normocytic anemia   IV drug user   Sacroiliitis ([place])  DVT prophylaxis:lovenox sq Code Status:full Family Communication:no family at bedside Disposition Plan:likely dc to SNF vs home depending on clinical improvement and if pt requires IV abx.    Consultants:   ID  Cards for TEE  Ortho consult  Procedures:TEE on 3/28 Antimicrobials:cefazolin since 3/26 Vancomycin 3/24-3/26  Subjective: Patient was seen and examined at bedside. Asking for IV Dilaudid. Denied headache, dizziness, nausea vomiting. Reports generalized body pain.   Objective: Vitals:   12/20/16 0523 12/20/16 1535 12/20/16 2237 12/21/16 0609  BP: 136/85 (!) 142/76 (!) 144/88 139/62  Pulse: 86 96 97 95  Resp: 18 18 19 20   Temp: 97.4 F (36.3 C) 98.7 F (37.1 C) 98.5 F (36.9 C) 98.6 F (37 C)  TempSrc: Oral Oral  Oral  SpO2: 99% 96% 93% 98%  Weight:      Height:        Intake/Output Summary (Last 24 hours) at 12/21/16 1057 Last data filed at 12/21/16 0703  Gross per  24 hour  Intake              340 ml  Output              300 ml  Net               40 ml   Filed Weights   12/13/16 1826  Weight: 72.6 kg (160 lb 1.6 oz)    Examination:  General exam: Not in distress, sitting on bed comfortable Respiratory system: Clear  bilateral. Respiratory effort normal. No wheezing or crackle Cardiovascular system: Regular rate rhythm, S1-S2 normal.  No pedal edema. Gastrointestinal system: Abdomen is nondistended, soft and nontender. Normal bowel sounds heard. Central nervous system: Alert Awake and following commands Skin: No rashes, lesions or ulcers Psychiatry: Judgement and insight appear normal. Mood & affect appropriate.     Data Reviewed: I have personally reviewed following labs and imaging studies  CBC:  Recent Labs Lab 12/17/16 0647 12/18/16 0619  WBC 6.7 6.7  HGB 12.3 12.3  HCT 36.7 37.4  MCV 78.1 77.6*  PLT 180 680   Basic Metabolic Panel:  Recent Labs Lab 12/15/16 0529 12/17/16 0647 12/18/16 0619 12/20/16 0526  NA 131* 131* 132*  --   K 3.5 3.7 4.0  --   CL 96* 92* 96*  --   CO2 27 27 28   --   GLUCOSE 119* 115* 147*  --   BUN 8 9 10   --   CREATININE 0.68 0.54 0.55 0.57  CALCIUM 8.5* 8.4* 8.4*  --    GFR: Estimated Creatinine Clearance: 84.9 mL/min (by C-G formula based on SCr of 0.57 mg/dL). Liver Function Tests: No results for input(s): AST, ALT, ALKPHOS, BILITOT, PROT, ALBUMIN in the last 168 hours. No results for input(s): LIPASE, AMYLASE in the last 168 hours. No results for input(s): AMMONIA in the last 168 hours. Coagulation Profile: No results for input(s): INR, PROTIME in the last 168 hours. Cardiac Enzymes: No results for input(s): CKTOTAL, CKMB, CKMBINDEX, TROPONINI in the last 168 hours. BNP (last 3 results) No results for input(s): PROBNP in the last 8760 hours. HbA1C: No results for input(s): HGBA1C in the last 72 hours. CBG: No results for input(s): GLUCAP in the last 168 hours. Lipid Profile: No results for input(s): CHOL, HDL, LDLCALC, TRIG, CHOLHDL, LDLDIRECT in the last 72 hours. Thyroid Function Tests: No results for input(s): TSH, T4TOTAL, FREET4, T3FREE, THYROIDAB in the last 72 hours. Anemia Panel: No results for input(s): VITAMINB12, FOLATE,  FERRITIN, TIBC, IRON, RETICCTPCT in the last 72 hours. Sepsis Labs: No results for input(s): PROCALCITON, LATICACIDVEN in the last 168 hours.  Recent Results (from the past 240 hour(s))  Urine culture     Status: Abnormal   Collection Time: 12/13/16  8:34 AM  Result Value Ref Range Status   Specimen Description URINE, CLEAN CATCH  Final   Special Requests NONE  Final   Culture <10,000 COLONIES/mL INSIGNIFICANT GROWTH (A)  Final   Report Status 12/14/2016 FINAL  Final  Culture, blood (x 2)     Status: Abnormal   Collection Time: 12/13/16 11:22 PM  Result Value Ref Range Status   Specimen Description BLOOD LEFT ANTECUBITAL  Final   Special Requests BOTTLES DRAWN AEROBIC AND ANAEROBIC 5CC  Final   Culture  Setup Time   Final    GRAM POSITIVE COCCI IN CLUSTERS IN BOTH AEROBIC AND ANAEROBIC BOTTLES CRITICAL RESULT CALLED TO, READ BACK BY AND VERIFIED WITH: Hughie Closs, [place], 12/14/16 AT  [place] (A)  Final   Report Status 12/16/2016 FINAL  Final   Organism ID, Bacteria STAPHYLOCOCCUS AUREUS  Final      Susceptibility   Staphylococcus aureus - MIC*    CIPROFLOXACIN 2 INTERMEDIATE Intermediate     ERYTHROMYCIN RESISTANT Resistant     GENTAMICIN <=0.5 SENSITIVE Sensitive     OXACILLIN 0.5 SENSITIVE Sensitive     TETRACYCLINE <=1 SENSITIVE Sensitive     VANCOMYCIN <=0.5 SENSITIVE Sensitive     TRIMETH/SULFA <=10 SENSITIVE Sensitive     CLINDAMYCIN RESISTANT Resistant     RIFAMPIN <=0.5 SENSITIVE Sensitive     Inducible Clindamycin POSITIVE Resistant     * STAPHYLOCOCCUS AUREUS  Culture, blood (x 2)     Status: Abnormal   Collection Time: 12/13/16 11:22 PM  Result Value Ref Range Status   Specimen Description BLOOD LEFT ANTECUBITAL  Final   Special Requests BOTTLES DRAWN AEROBIC AND ANAEROBIC 5CC  Final   Culture  Setup Time   Final    GRAM POSITIVE COCCI IN CLUSTERS IN BOTH AEROBIC AND ANAEROBIC BOTTLES CRITICAL VALUE NOTED.  VALUE IS  CONSISTENT WITH PREVIOUSLY REPORTED AND CALLED VALUE.    Culture (A)  Final    STAPHYLOCOCCUS AUREUS SUSCEPTIBILITIES PERFORMED ON PREVIOUS CULTURE WITHIN THE LAST 5 DAYS.    Report Status 12/16/2016 FINAL  Final  Blood Culture ID Panel (Reflexed)     Status: Abnormal   Collection Time: 12/13/16 11:22 PM  Result Value Ref Range Status   Enterococcus species NOT DETECTED NOT DETECTED Final   Listeria monocytogenes NOT DETECTED NOT DETECTED Final   Staphylococcus species DETECTED (A) NOT DETECTED Final    Comment: CRITICAL RESULT CALLED TO, READ BACK BY AND VERIFIED WITH: Romero Belling [phone] AT 1711 BY J FUDESC    Staphylococcus aureus DETECTED (A) NOT DETECTED Final    Comment: Methicillin (oxacillin) susceptible Staphylococcus aureus (MSSA). Preferred therapy is anti staphylococcal beta lactam antibiotic (Cefazolin or Nafcillin), unless clinically contraindicated. CRITICAL RESULT CALLED TO, READ BACK BY AND VERIFIED WITH: Hughie Closs, PHARM, 12/14/16 AT 1711 BY J FUDESCO    Methicillin resistance NOT DETECTED NOT DETECTED Final   Streptococcus species NOT DETECTED NOT DETECTED Final   Streptococcus agalactiae NOT DETECTED NOT DETECTED Final   Streptococcus pneumoniae NOT DETECTED NOT DETECTED Final   Streptococcus pyogenes NOT DETECTED NOT DETECTED Final   Acinetobacter baumannii NOT DETECTED NOT DETECTED Final   Enterobacteriaceae species NOT DETECTED NOT DETECTED Final   Enterobacter cloacae complex NOT DETECTED NOT DETECTED Final   Escherichia coli NOT DETECTED NOT DETECTED Final   Klebsiella oxytoca NOT DETECTED NOT DETECTED Final   Klebsiella pneumoniae NOT DETECTED NOT DETECTED Final   Proteus species NOT DETECTED NOT DETECTED Final   Serratia marcescens NOT DETECTED NOT DETECTED Final   Haemophilus influenzae NOT DETECTED NOT DETECTED Final   Neisseria meningitidis NOT DETECTED NOT DETECTED Final   Pseudomonas aeruginosa NOT DETECTED NOT DETECTED Final   Candida albicans  NOT DETECTED NOT DETECTED Final   Candida glabrata NOT DETECTED NOT DETECTED Final   Candida krusei NOT DETECTED NOT DETECTED Final   Candida parapsilosis NOT DETECTED NOT DETECTED Final   Candida tropicalis NOT DETECTED NOT DETECTED Final  Culture, blood (routine x 2)     Status: Abnormal   Collection Time: 12/15/16  9:48 AM  Result Value Ref Range Status   Specimen Description BLOOD BLOOD RIGHT HAND  Final   Special Requests IN  PEDIATRIC BOTTLE 2.5CC  Final   Culture  Setup Time   Final    GRAM POSITIVE COCCI IN CLUSTERS IN PEDIATRIC BOTTLE CRITICAL VALUE NOTED.  VALUE IS CONSISTENT WITH PREVIOUSLY REPORTED AND CALLED VALUE.    Culture (A)  Final    STAPHYLOCOCCUS AUREUS SUSCEPTIBILITIES PERFORMED ON PREVIOUS CULTURE WITHIN THE LAST 5 DAYS.    Report Status 12/17/2016 FINAL  Final  Culture, blood (routine x 2)     Status: None   Collection Time: 12/15/16  9:48 AM  Result Value Ref Range Status   Specimen Description BLOOD BLOOD LEFT HAND  Final   Special Requests IN PEDIATRIC BOTTLE 1CC  Final   Culture NO GROWTH 5 DAYS  Final   Report Status 12/20/2016 FINAL  Final  Culture, blood (routine x 2)     Status: None (Preliminary result)   Collection Time: 12/16/16  1:00 PM  Result Value Ref Range Status   Specimen Description BLOOD RIGHT HAND  Final   Special Requests IN PEDIATRIC BOTTLE  1.5 CC  Final   Culture NO GROWTH 4 DAYS  Final   Report Status PENDING  Incomplete  Culture, blood (routine x 2)     Status: None (Preliminary result)   Collection Time: 12/16/16  1:15 PM  Result Value Ref Range Status   Specimen Description BLOOD LEFT ARM  Final   Special Requests IN PEDIATRIC BOTTLE  1.5  Final   Culture NO GROWTH 4 DAYS  Final   Report Status PENDING  Incomplete         Radiology Studies: No results found.      Scheduled Meds: .  ceFAZolin (ANCEF) IV  2 g Intravenous Q8H  . docusate sodium  100 mg Oral BID  . enoxaparin (LOVENOX) injection  40 mg  Subcutaneous Q24H  . methocarbamol  1,000 mg Oral QID  . nicotine  21 mg Transdermal Daily  . polyethylene glycol  17 g Oral Daily  . pregabalin  75 mg Oral BID  . sodium chloride flush  3 mL Intravenous Q12H   Continuous Infusions:    LOS: 7 days    Holly Weber Tanna Furry, MD [facility] Pager [phone]  If 7PM-7AM, please contact night-coverage [email or web address] Password [facility] 12/21/2016, 10:57 AM

## 2016-12-21 NOTE — Progress Notes (Signed)
Patient stating she is tired of waiting and her father is sick and want to leave AMA.  Advise patient that she is very sick herself and leaving here will not help her situation. She verbalized understanding that she needed IV antibiotics to help get better. When we took the [facility] paper work and explained she will not be leaving with pain medication, patient was upset and stated she was staying and will be suing.

## 2016-12-21 NOTE — Progress Notes (Signed)
Patient called front desk stating that she needs to leave the hospital ASAP. Charge nurse Michaelle Copas entered room and educated patient that she is very sick and needs to stay in the hospital to receive antibiotics to help her get better. Patient demanded to leave AMA. Charge nurse Michaelle Copas paged MD and IV team in order for patient's PICC line to be removed prior to leaving AMA. IV team nurse came and removed PICC line at 2050. Patient dressed and belongings gathered and AMA paper signed by patient and witnessed. Patient escorted to ED waiting room in wheelchair by Cristie Hem, Nurse Tech. Patient stated her son was picking her up to leave the hospital. Sch[place],NP aware.

## 2016-12-22 ENCOUNTER — Encounter (HOSPITAL_COMMUNITY): Payer: Self-pay | Admitting: Emergency Medicine

## 2016-12-22 ENCOUNTER — Inpatient Hospital Stay (HOSPITAL_COMMUNITY)
Admission: EM | Admit: 2016-12-22 | Discharge: 2016-12-24 | DRG: 872 | Discharge status: Against Medical Advice | Payer: Self-pay | Attending: Family Medicine | Admitting: Family Medicine

## 2016-12-22 DIAGNOSIS — I10 Essential (primary) hypertension: Secondary | ICD-10-CM | POA: Diagnosis present

## 2016-12-22 DIAGNOSIS — R739 Hyperglycemia, unspecified: Secondary | ICD-10-CM | POA: Diagnosis present

## 2016-12-22 DIAGNOSIS — F1410 Cocaine abuse, uncomplicated: Secondary | ICD-10-CM | POA: Diagnosis present

## 2016-12-22 DIAGNOSIS — E871 Hypo-osmolality and hyponatremia: Secondary | ICD-10-CM

## 2016-12-22 DIAGNOSIS — Z5321 Procedure and treatment not carried out due to patient leaving prior to being seen by health care provider: Secondary | ICD-10-CM | POA: Diagnosis not present

## 2016-12-22 DIAGNOSIS — B9561 Methicillin susceptible Staphylococcus aureus infection as the cause of diseases classified elsewhere: Secondary | ICD-10-CM | POA: Diagnosis present

## 2016-12-22 DIAGNOSIS — Z720 Tobacco use: Secondary | ICD-10-CM | POA: Diagnosis present

## 2016-12-22 DIAGNOSIS — M869 Osteomyelitis, unspecified: Secondary | ICD-10-CM

## 2016-12-22 DIAGNOSIS — Z8619 Personal history of other infectious and parasitic diseases: Secondary | ICD-10-CM

## 2016-12-22 DIAGNOSIS — A4101 Sepsis due to Methicillin susceptible Staphylococcus aureus: Principal | ICD-10-CM | POA: Diagnosis present

## 2016-12-22 DIAGNOSIS — M461 Sacroiliitis, not elsewhere classified: Secondary | ICD-10-CM | POA: Diagnosis present

## 2016-12-22 DIAGNOSIS — F1910 Other psychoactive substance abuse, uncomplicated: Secondary | ICD-10-CM

## 2016-12-22 DIAGNOSIS — M25552 Pain in left hip: Secondary | ICD-10-CM

## 2016-12-22 DIAGNOSIS — R7881 Bacteremia: Secondary | ICD-10-CM | POA: Diagnosis present

## 2016-12-22 DIAGNOSIS — Z8673 Personal history of transient ischemic attack (TIA), and cerebral infarction without residual deficits: Secondary | ICD-10-CM

## 2016-12-22 DIAGNOSIS — M009 Pyogenic arthritis, unspecified: Secondary | ICD-10-CM | POA: Diagnosis present

## 2016-12-22 DIAGNOSIS — Z886 Allergy status to analgesic agent status: Secondary | ICD-10-CM

## 2016-12-22 DIAGNOSIS — F1210 Cannabis abuse, uncomplicated: Secondary | ICD-10-CM | POA: Diagnosis present

## 2016-12-22 DIAGNOSIS — Z880 Allergy status to penicillin: Secondary | ICD-10-CM

## 2016-12-22 DIAGNOSIS — M549 Dorsalgia, unspecified: Secondary | ICD-10-CM | POA: Diagnosis present

## 2016-12-22 DIAGNOSIS — M609 Myositis, unspecified: Secondary | ICD-10-CM | POA: Diagnosis present

## 2016-12-22 LAB — CBC WITH DIFFERENTIAL/PLATELET
BASOS ABS: 0.1 10*3/uL (ref 0.0–0.1)
BASOS PCT: 1 %
Eosinophils Absolute: 0.2 10*3/uL (ref 0.0–0.7)
Eosinophils Relative: 1 %
HEMATOCRIT: 38.9 % (ref 36.0–46.0)
HEMOGLOBIN: 12.9 g/dL (ref 12.0–15.0)
LYMPHS PCT: 23 %
Lymphs Abs: 3 10*3/uL (ref 0.7–4.0)
MCH: 25.9 pg — ABNORMAL LOW (ref 26.0–34.0)
MCHC: 33.2 g/dL (ref 30.0–36.0)
MCV: 78.1 fL (ref 78.0–100.0)
Monocytes Absolute: 0.8 10*3/uL (ref 0.1–1.0)
Monocytes Relative: 6 %
NEUTROS ABS: 9 10*3/uL — AB (ref 1.7–7.7)
NEUTROS PCT: 69 %
Platelets: 438 10*3/uL — ABNORMAL HIGH (ref 150–400)
RBC: 4.98 MIL/uL (ref 3.87–5.11)
RDW: 14.1 % (ref 11.5–15.5)
WBC: 13 10*3/uL — ABNORMAL HIGH (ref 4.0–10.5)

## 2016-12-22 LAB — BASIC METABOLIC PANEL
Anion gap: 12 (ref 5–15)
BUN: 11 mg/dL (ref 6–20)
CHLORIDE: 94 mmol/L — AB (ref 101–111)
CO2: 24 mmol/L (ref 22–32)
CREATININE: 0.53 mg/dL (ref 0.44–1.00)
Calcium: 9 mg/dL (ref 8.9–10.3)
GFR calc non Af Amer: >60 mL/min (ref >60)
GLUCOSE: 122 mg/dL — AB (ref 65–99)
Potassium: 4.6 mmol/L (ref 3.5–5.1)
Sodium: 130 mmol/L — ABNORMAL LOW (ref 135–145)

## 2016-12-22 LAB — RAPID URINE DRUG SCREEN, HOSP PERFORMED
Amphetamines: NOT DETECTED
BENZODIAZEPINES: NOT DETECTED
Barbiturates: NOT DETECTED
Cocaine: POSITIVE — AB
OPIATES: POSITIVE — AB
Tetrahydrocannabinol: POSITIVE — AB

## 2016-12-22 LAB — GLUCOSE, CAPILLARY: GLUCOSE-CAPILLARY: 131 mg/dL — AB (ref 65–99)

## 2016-12-22 MED ORDER — ENOXAPARIN SODIUM 40 MG/0.4ML ~~LOC~~ SOLN
40.0000 mg | SUBCUTANEOUS | Status: DC
  Administered 2016-12-22 – 2016-12-23 (×2): 40 mg via SUBCUTANEOUS
  Filled 2016-12-22 (×2): qty 0.4

## 2016-12-22 MED ORDER — ACETAMINOPHEN 500 MG PO TABS
1000.0000 mg | ORAL_TABLET | Freq: Four times a day (QID) | ORAL | Status: DC | PRN
  Administered 2016-12-22 – 2016-12-23 (×3): 1000 mg via ORAL
  Filled 2016-12-22 (×3): qty 2

## 2016-12-22 MED ORDER — NICOTINE 21 MG/24HR TD PT24
21.0000 mg | MEDICATED_PATCH | Freq: Every day | TRANSDERMAL | Status: DC
  Administered 2016-12-22 – 2016-12-23 (×2): 21 mg via TRANSDERMAL
  Filled 2016-12-22 (×2): qty 1

## 2016-12-22 MED ORDER — OXYCODONE-ACETAMINOPHEN 5-325 MG PO TABS: 1.0000 | ORAL_TABLET | Freq: Once | ORAL | Status: DC

## 2016-12-22 MED ORDER — LORAZEPAM 2 MG/ML IJ SOLN
0.5000 mg | Freq: Two times a day (BID) | INTRAMUSCULAR | Status: DC | PRN
  Administered 2016-12-22 – 2016-12-23 (×3): 0.5 mg via INTRAVENOUS
  Filled 2016-12-22 (×3): qty 1

## 2016-12-22 MED ORDER — PROMETHAZINE HCL 25 MG PO TABS
12.5000 mg | ORAL_TABLET | Freq: Four times a day (QID) | ORAL | Status: DC | PRN
  Administered 2016-12-22: 12.5 mg via ORAL
  Filled 2016-12-22: qty 1

## 2016-12-22 MED ORDER — DICLOFENAC SODIUM 1 % TD GEL
4.0000 g | Freq: Four times a day (QID) | TRANSDERMAL | Status: DC | PRN
  Administered 2016-12-22: 17:00:00 4 g via TOPICAL
  Filled 2016-12-22: qty 100

## 2016-12-22 MED ORDER — WHITE PETROLATUM GEL
Status: AC
  Filled 2016-12-22: qty 1

## 2016-12-22 MED ORDER — ACETAMINOPHEN 650 MG RE SUPP: 650.0000 mg | Freq: Four times a day (QID) | RECTAL | Status: DC | PRN

## 2016-12-22 MED ORDER — OXYCODONE HCL 5 MG PO TABS
5.0000 mg | ORAL_TABLET | Freq: Four times a day (QID) | ORAL | Status: DC | PRN
  Administered 2016-12-22 – 2016-12-23 (×5): 10 mg via ORAL
  Filled 2016-12-22 (×5): qty 2

## 2016-12-22 MED ORDER — CEFAZOLIN SODIUM-DEXTROSE 2-4 GM/100ML-% IV SOLN
2.0000 g | Freq: Three times a day (TID) | INTRAVENOUS | Status: DC
  Administered 2016-12-22 – 2016-12-23 (×6): 2 g via INTRAVENOUS
  Filled 2016-12-22 (×7): qty 100

## 2016-12-22 MED ORDER — SODIUM CHLORIDE 0.9 % IV BOLUS (SEPSIS)
1000.0000 mL | Freq: Once | INTRAVENOUS | Status: AC
  Administered 2016-12-22: 1000 mL via INTRAVENOUS

## 2016-12-22 NOTE — ED Notes (Addendum)
Pt sleeping but as soon as woken states she has pain in left hip and slight nausea. Medicated for both. Attempted to readjust pt for comfort. Female urinal given per pt request. Voiding without difficulty.

## 2016-12-22 NOTE — Discharge Summary (Signed)
Patient left against medical advise.   Patient was seen and examined on 12/21/2016. When I come to hospital today (4/2), I noticed that patient left AMA last night and already came to ER for admission.  Please refer to my note from 12/21/2016 for detail.   I noted that patient is already admitted today.
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
## 2016-12-22 NOTE — Progress Notes (Signed)
Pharmacy Antibiotic Note  Holly Weber is a 51 y.o. female admitted on 12/22/2016 with MSSA bacteremia/osteomyelitis.  Pharmacy has been consulted for Cefazolin dosing. Pt just left AMA yesterday evening and now back with hip/leg pain. Plan is for 6 weeks of anti-biotics with a stop date of Jan 25, 2017.   Plan: -Cefazolin 2g IV q8h to complete 6 weeks (01/25/17) -Trend WBC, temp, renal function   Temp (24hrs), Avg:98.5 F (36.9 C), Min:98.2 F (36.8 C), Max:98.6 F (37 C)   Recent Labs Lab 12/17/16 0647 12/18/16 0619 12/20/16 0526 12/22/16 0428  WBC 6.7 6.7  --  13.0*  CREATININE 0.54 0.55 0.57 0.53    Estimated Creatinine Clearance: 84.9 mL/min (by C-G formula based on SCr of 0.53 mg/dL).    Narda Bonds 12/22/2016 5:56 AM

## 2016-12-22 NOTE — ED Triage Notes (Signed)
Brought by ems from home for c/o left back/hip/leg pain that has been going on for 11 days.  Left AMA from hospital 4 hours PTA.  Requesting dilaudid and morphine in triage.

## 2016-12-22 NOTE — ED Notes (Signed)
Pt moaning loudly during blood draw, occasionally yelling out.

## 2016-12-22 NOTE — ED Notes (Signed)
Pt resting with eyes closed. resp e/u at rate of 16. Skin w/d

## 2016-12-22 NOTE — Progress Notes (Signed)
Patient received from the ED in stretcher. Patient complaining of pain. MD called for pain meds.  Patient in bed with call bed in place in low position.  Sheliah Plane RN

## 2016-12-22 NOTE — H&P (Signed)
History and Physical    Holly Weber QQI:[id number] DOB: 1966-04-06 DOA: 12/22/2016  PCP: No PCP Per Patient Patient coming from: home  Chief Complaint: L hip and back pain  HPI: Holly Weber is a 51 y.o. female with medical history significant of ulcerative colitis, depression, hepatitis, hypertension, CVA, hypothyroidism. Patient presenting with persistent left hip and back pain. Of note patient left the hospital less than 24 hours ago AMA. She was originally admitted on 12/13/2016 and found to have MSSA bacteremia with left hip osteomyelitis and myositis. At time of her initial presentation patient was septic. Currently patient's only complaint is worsening left hip pain. Denies any drug use since leaving the hospital or since coming into the hospital. States she's been clean from cocaine for more than a month.  Level V caveat applies this patient will give conflicting answers to some questions or bizarre answers and seems incapable of providing reliable history.  Currently patient denies any fevers, chills, shortness breath, palpitations, nausea, vomiting, chest pain, nuchal rigidity, headache, LOC, dizziness, focal neurological deficit.  Per review of chart patient left the hospital due to take care of her daughter who was apparently in an accident however when questioned about this at this point time patient initially seemed very surprised and confused and spoke about her daughter's accident from approximately 1 year ago.  Additionally there was an earlier report by nursing staff that patient ingested a clear substance from a syringe before can be confiscated.  ED Course: Restarted on Ancef  Review of Systems: As per HPI otherwise 10 point review of systems negative.   Ambulatory Status:no restrictions  Past Medical History:  Diagnosis Date  . Colitis, ulcerative ([place])   . Depression   . Hepatitis C   . Hypertension   . Stroke ([place])   . Thyroid disease     Past Surgical  History:  Procedure Laterality Date  . ARM WOUND REPAIR / CLOSURE    . COLONOSCOPY    . TEE WITHOUT CARDIOVERSION N/A 12/17/2016   Procedure: TRANSESOPHAGEAL ECHOCARDIOGRAM (TEE);  Surgeon: Josue Hector, MD;  Location: [facility] ENDOSCOPY;  Service: Cardiovascular;  Laterality: N/A;  . TONSILLECTOMY      Social History   Social History  . Marital status: Widowed    Spouse name: N/A  . Number of children: N/A  . Years of education: N/A   Occupational History  . Not on file.   Social History Main Topics  . Smoking status: Current Every Day Smoker    Types: Cigarettes  . Smokeless tobacco: Never Used  . Alcohol use No  . Drug use: Yes    Types: Cocaine     Comment: last use 4 weeks ago  . Sexual activity: Yes    Birth control/ protection: None   Other Topics Concern  . Not on file   Social History Narrative  . No narrative on file    Allergies  Allergen Reactions  . Asa [Aspirin] Other (See Comments)    Ulcerative colitis flares.  . Nsaids Other (See Comments)    Ulcerative colitis flares  . Penicillins Rash and Other (See Comments)    Has patient had a PCN reaction causing immediate rash, facial/tongue/throat swelling, SOB or lightheadedness with hypotension:unsure Has patient had a PCN reaction causing severe rash involving mucus membranes or skin necrosis:unsure Has patient had a PCN reaction that required hospitalization:No Has patient had a PCN reaction occurring within the last 10 years:Yes If all of the above answers are "  NO", then may proceed with Cephalosporin use.     No family history on file.  Prior to Admission medications   Not on File    Physical Exam: Vitals:   12/22/16 0230 12/22/16 0354 12/22/16 0756 12/22/16 0843  BP: (!) 185/85 (!) 147/79  (!) 130/91  Pulse: (!) 105 97  93  Resp: (!) 22 20  19   Temp: 98.6 F (37 C) 98.4 F (36.9 C)    TempSrc: Oral Oral    SpO2: 97% 95% 98% 95%     General: Appears somewhat anxious, resting in bed  comfortably. Eyes:  PERRL, EOMI, normal lids, iris ENT: Poor condition, dry mucous membranes Neck:  no LAD, masses or thyromegaly Cardiovascular:  RRR, no m/r/g. No LE edema.  Respiratory:  CTA bilaterally, no w/r/r. Normal respiratory effort. Abdomen:  soft, ntnd, NABS Skin:  no rash or induration seen on limited exam Musculoskeletal: No pain on palpation of left groin the patient resistant to movement. No bony upper maladies appreciated. Psychiatric: Somewhat confused. Alert and oriented 4. Follows basic commands. Pleasant. Neurologic:  CN 2-12 grossly intact, moves all extremities in coordinated fashion, sensation intact  Labs on Admission: I have personally reviewed following labs and imaging studies  CBC:  Recent Labs Lab 12/17/16 0647 12/18/16 0619 12/22/16 0428  WBC 6.7 6.7 13.0*  NEUTROABS  --   --  9.0*  HGB 12.3 12.3 12.9  HCT 36.7 37.4 38.9  MCV 78.1 77.6* 78.1  PLT 180 203 967*   Basic Metabolic Panel:  Recent Labs Lab 12/17/16 0647 12/18/16 0619 12/20/16 0526 12/22/16 0428  NA 131* 132*  --  130*  K 3.7 4.0  --  4.6  CL 92* 96*  --  94*  CO2 27 28  --  24  GLUCOSE 115* 147*  --  122*  BUN 9 10  --  11  CREATININE 0.54 0.55 0.57 0.53  CALCIUM 8.4* 8.4*  --  9.0   GFR: Estimated Creatinine Clearance: 84.9 mL/min (by C-G formula based on SCr of 0.53 mg/dL). Liver Function Tests: No results for input(s): AST, ALT, ALKPHOS, BILITOT, PROT, ALBUMIN in the last 168 hours. No results for input(s): LIPASE, AMYLASE in the last 168 hours. No results for input(s): AMMONIA in the last 168 hours. Coagulation Profile: No results for input(s): INR, PROTIME in the last 168 hours. Cardiac Enzymes: No results for input(s): CKTOTAL, CKMB, CKMBINDEX, TROPONINI in the last 168 hours. BNP (last 3 results) No results for input(s): PROBNP in the last 8760 hours. HbA1C: No results for input(s): HGBA1C in the last 72 hours. CBG: No results for input(s): GLUCAP in the  last 168 hours. Lipid Profile: No results for input(s): CHOL, HDL, LDLCALC, TRIG, CHOLHDL, LDLDIRECT in the last 72 hours. Thyroid Function Tests: No results for input(s): TSH, T4TOTAL, FREET4, T3FREE, THYROIDAB in the last 72 hours. Anemia Panel: No results for input(s): VITAMINB12, FOLATE, FERRITIN, TIBC, IRON, RETICCTPCT in the last 72 hours. Urine analysis:    Component Value Date/Time   COLORURINE YELLOW 12/13/2016 0835   APPEARANCEUR HAZY (A) 12/13/2016 0835   LABSPEC 1.018 12/13/2016 0835   PHURINE 7.0 12/13/2016 0835   GLUCOSEU NEGATIVE 12/13/2016 0835   HGBUR NEGATIVE 12/13/2016 0835   BILIRUBINUR NEGATIVE 12/13/2016 0835   KETONESUR 5 (A) 12/13/2016 0835   PROTEINUR NEGATIVE 12/13/2016 0835   NITRITE NEGATIVE 12/13/2016 0835   LEUKOCYTESUR NEGATIVE 12/13/2016 0835    Creatinine Clearance: Estimated Creatinine Clearance: 84.9 mL/min (by C-G formula based on SCr  of 0.53 mg/dL).  Sepsis Labs: @LABRCNTIP (procalcitonin:4,lacticidven:4) ) Recent Results (from the past 240 hour(s))  Urine culture     Status: Abnormal   Collection Time: 12/13/16  8:34 AM  Result Value Ref Range Status   Specimen Description URINE, CLEAN CATCH  Final   Special Requests NONE  Final   Culture <10,000 COLONIES/mL INSIGNIFICANT GROWTH (A)  Final   Report Status 12/14/2016 FINAL  Final  Culture, blood (x 2)     Status: Abnormal   Collection Time: 12/13/16 11:22 PM  Result Value Ref Range Status   Specimen Description BLOOD LEFT ANTECUBITAL  Final   Special Requests BOTTLES DRAWN AEROBIC AND ANAEROBIC 5CC  Final   Culture  Setup Time   Final    GRAM POSITIVE COCCI IN CLUSTERS IN BOTH AEROBIC AND ANAEROBIC BOTTLES CRITICAL RESULT CALLED TO, READ BACK BY AND VERIFIED WITH: Hughie Closs, [place], 12/14/16 AT 37 BY J FUDESCO    Culture STAPHYLOCOCCUS AUREUS (A)  Final   Report Status 12/16/2016 FINAL  Final   Organism ID, Bacteria STAPHYLOCOCCUS AUREUS  Final      Susceptibility   Staphylococcus  aureus - MIC*    CIPROFLOXACIN 2 INTERMEDIATE Intermediate     ERYTHROMYCIN RESISTANT Resistant     GENTAMICIN <=0.5 SENSITIVE Sensitive     OXACILLIN 0.5 SENSITIVE Sensitive     TETRACYCLINE <=1 SENSITIVE Sensitive     VANCOMYCIN <=0.5 SENSITIVE Sensitive     TRIMETH/SULFA <=10 SENSITIVE Sensitive     CLINDAMYCIN RESISTANT Resistant     RIFAMPIN <=0.5 SENSITIVE Sensitive     Inducible Clindamycin POSITIVE Resistant     * STAPHYLOCOCCUS AUREUS  Culture, blood (x 2)     Status: Abnormal   Collection Time: 12/13/16 11:22 PM  Result Value Ref Range Status   Specimen Description BLOOD LEFT ANTECUBITAL  Final   Special Requests BOTTLES DRAWN AEROBIC AND ANAEROBIC 5CC  Final   Culture  Setup Time   Final    GRAM POSITIVE COCCI IN CLUSTERS IN BOTH AEROBIC AND ANAEROBIC BOTTLES CRITICAL VALUE NOTED.  VALUE IS CONSISTENT WITH PREVIOUSLY REPORTED AND CALLED VALUE.    Culture (A)  Final    STAPHYLOCOCCUS AUREUS SUSCEPTIBILITIES PERFORMED ON PREVIOUS CULTURE WITHIN THE LAST 5 DAYS.    Report Status 12/16/2016 FINAL  Final  Blood Culture ID Panel (Reflexed)     Status: Abnormal   Collection Time: 12/13/16 11:22 PM  Result Value Ref Range Status   Enterococcus species NOT DETECTED NOT DETECTED Final   Listeria monocytogenes NOT DETECTED NOT DETECTED Final   Staphylococcus species DETECTED (A) NOT DETECTED Final    Comment: CRITICAL RESULT CALLED TO, READ BACK BY AND VERIFIED WITH: Romero Belling [id number] AT 1711 BY J FUDESC    Staphylococcus aureus DETECTED (A) NOT DETECTED Final    Comment: Methicillin (oxacillin) susceptible Staphylococcus aureus (MSSA). Preferred therapy is anti staphylococcal beta lactam antibiotic (Cefazolin or Nafcillin), unless clinically contraindicated. CRITICAL RESULT CALLED TO, READ BACK BY AND VERIFIED WITH: Hughie Closs, [place], 12/14/16 AT 1711 BY J FUDESCO    Methicillin resistance NOT DETECTED NOT DETECTED Final   Streptococcus species NOT DETECTED NOT  DETECTED Final   Streptococcus agalactiae NOT DETECTED NOT DETECTED Final   Streptococcus pneumoniae NOT DETECTED NOT DETECTED Final   Streptococcus pyogenes NOT DETECTED NOT DETECTED Final   Acinetobacter baumannii NOT DETECTED NOT DETECTED Final   Enterobacteriaceae species NOT DETECTED NOT DETECTED Final   Enterobacter cloacae complex NOT DETECTED NOT DETECTED Final   Escherichia  coli NOT DETECTED NOT DETECTED Final   Klebsiella oxytoca NOT DETECTED NOT DETECTED Final   Klebsiella pneumoniae NOT DETECTED NOT DETECTED Final   Proteus species NOT DETECTED NOT DETECTED Final   Serratia marcescens NOT DETECTED NOT DETECTED Final   Haemophilus influenzae NOT DETECTED NOT DETECTED Final   Neisseria meningitidis NOT DETECTED NOT DETECTED Final   Pseudomonas aeruginosa NOT DETECTED NOT DETECTED Final   Candida albicans NOT DETECTED NOT DETECTED Final   Candida glabrata NOT DETECTED NOT DETECTED Final   Candida krusei NOT DETECTED NOT DETECTED Final   Candida parapsilosis NOT DETECTED NOT DETECTED Final   Candida tropicalis NOT DETECTED NOT DETECTED Final  Culture, blood (routine x 2)     Status: Abnormal   Collection Time: 12/15/16  9:48 AM  Result Value Ref Range Status   Specimen Description BLOOD BLOOD RIGHT HAND  Final   Special Requests IN PEDIATRIC BOTTLE 2.5CC  Final   Culture  Setup Time   Final    GRAM POSITIVE COCCI IN CLUSTERS IN PEDIATRIC BOTTLE CRITICAL VALUE NOTED.  VALUE IS CONSISTENT WITH PREVIOUSLY REPORTED AND CALLED VALUE.    Culture (A)  Final    STAPHYLOCOCCUS AUREUS SUSCEPTIBILITIES PERFORMED ON PREVIOUS CULTURE WITHIN THE LAST 5 DAYS.    Report Status 12/17/2016 FINAL  Final  Culture, blood (routine x 2)     Status: None   Collection Time: 12/15/16  9:48 AM  Result Value Ref Range Status   Specimen Description BLOOD BLOOD LEFT HAND  Final   Special Requests IN PEDIATRIC BOTTLE 1CC  Final   Culture NO GROWTH 5 DAYS  Final   Report Status 12/20/2016 FINAL   Final  Culture, blood (routine x 2)     Status: None   Collection Time: 12/16/16  1:00 PM  Result Value Ref Range Status   Specimen Description BLOOD RIGHT HAND  Final   Special Requests IN PEDIATRIC BOTTLE  1.5 CC  Final   Culture NO GROWTH 5 DAYS  Final   Report Status 12/21/2016 FINAL  Final  Culture, blood (routine x 2)     Status: None   Collection Time: 12/16/16  1:15 PM  Result Value Ref Range Status   Specimen Description BLOOD LEFT ARM  Final   Special Requests IN PEDIATRIC BOTTLE  1.5  Final   Culture NO GROWTH 5 DAYS  Final   Report Status 12/21/2016 FINAL  Final     Radiological Exams on Admission: No results found.  EKG: pending  Assessment/Plan Active Problems:   Tobacco abuse   HTN (hypertension)   Staphylococcus aureus bacteremia   Hyperglycemia   Polysubstance abuse   Osteomyelitis (HCC)   Hyponatremia   MSSA bacteremia: found during last admission prior to pt leaving AMA for 24hrs. Originally admitted on 12/13/2016 with MSSA bacteremia and sepsis. Plan is for 6 weeks of cefazolin per Dr. Megan Salon of ID. Will need SNF for IV ABX. Repeat Cx from 3/27 no growth. - continue Cefazolin (started 12/13/16 w/ laps of approximately 2 doses from 4/1-4/2) should complete on 01/24/17. - reconsult CSW for placement - Follow blood cultures drawn on 12/22/2016 at time of her admission - reconsult ID if needed.   Back pan / L hip pain: Imaging from previous admission consistent w/ L hip septic joint associated w/ osteo and myositis.  - Continue ABX as above - PT/OT - consider reimaging if not improving - Oxycodone, voltaren gel  Polysubstance abuse: UDS is on a previous admission positive for cocaine  and THC. Pt states she hasn't done drugs in 4 weeks but during time in ED pt took some unknown clear substance from a syringe. Acting odd at time of admission.  - Repeat UDS - ETOH level - Consider CIWA (unknown ETOH use)  Hyponatremia: 130 on admission. Suspect hypovolemic  and likely ETOH intake.  - 1L NS bolus - BMET Qam  Hypertension: no Bblockers - Hydralazine prn  Hyperglycemia: - A1c - CBG ACHS  Tobacco abuse: - Continue nicotine patch   DVT prophylaxis: Lovenox  Code Status: full  Family Communication: none  Disposition Plan: pending improvement in condition and placement at SNF for IV ABX  Consults called: social work  Admission status: inpt    MERRELL, DAVID J MD [facility]  If 7PM-7AM, please contact night-coverage [email or web address] Password [email or web address]  12/22/2016, 9:04 AM

## 2016-12-22 NOTE — ED Notes (Signed)
Pt now noted to be sleeping soundly, RR even and unlabored, NAD. 02 sat 94-95%

## 2016-12-22 NOTE — ED Notes (Signed)
Nicotine patch noted to LUE. Pt states patch was not removed before she left hospital

## 2016-12-22 NOTE — ED Notes (Signed)
Admitting MD at bedside.

## 2016-12-22 NOTE — ED Provider Notes (Signed)
Emergency Department Provider Note   I have reviewed the triage vital signs and the nursing notes.   HISTORY  Chief Complaint Hip Pain (left sided)   HPI Derian LAIYLA SLAGEL is a 51 y.o. female with history of Hep C, prior stroke, cocaine use (last 4 weeks ago by IV) who presents to the ED for evaluation of hip pain. This patient was seen and admitted to this facility on 3/24 with uncontrollable L hip pain in the setting of IV drug use. She was later found to have MSSA bacteremia and L sacroiliitis. Several hours ago during admission she left Against Medical Advice because "her daughter got in a car wreck." She re-presents to this facility with persistent pain. Worse with movement. No radiation. Pain is severe.    Past Medical History:  Diagnosis Date  . Colitis, ulcerative ([place])   . Depression   . Hepatitis C   . Hypertension   . Stroke ([place])   . Thyroid disease     Patient Active Problem List   Diagnosis Date Noted  . Sacroiliitis ([place])   . Staphylococcus aureus bacteremia 12/15/2016  . Pneumonia 12/15/2016  . Hyperglycemia 12/15/2016  . Normocytic anemia 12/15/2016  . IV drug user 12/15/2016  . Acute left-sided low back pain with left-sided sciatica 12/13/2016  . Cocaine abuse 12/13/2016  . Tobacco abuse 12/13/2016  . HTN (hypertension) 12/13/2016    Past Surgical History:  Procedure Laterality Date  . ARM WOUND REPAIR / CLOSURE    . COLONOSCOPY    . TEE WITHOUT CARDIOVERSION N/A 12/17/2016   Procedure: TRANSESOPHAGEAL ECHOCARDIOGRAM (TEE);  Surgeon: Josue Hector, MD;  Location: [place];  Service: Cardiovascular;  Laterality: N/A;  . TONSILLECTOMY        Allergies Asa [aspirin]; Nsaids; and Penicillins  No family history on file.  Social History Social History  Substance Use Topics  . Smoking status: Current Every Day Smoker    Types: Cigarettes  . Smokeless tobacco: Never Used  . Alcohol use No    Review of Systems Constitutional: No  fever/chills Eyes: No visual changes. ENT: No sore throat. Cardiovascular: Denies chest pain. Respiratory: Denies shortness of breath. Gastrointestinal: No abdominal pain.  No nausea, no vomiting.  No diarrhea.  No constipation. Genitourinary: Negative for dysuria. Musculoskeletal: Negative for back pain. + L hip pain Skin: Negative for rash. Neurological: Negative for headaches, focal weakness or numbness.  10-point ROS otherwise negative.  ____________________________________________   PHYSICAL EXAM:  VITAL SIGNS: ED Triage Vitals  Enc Vitals Group     BP 12/22/16 0230 (!) 185/85     Pulse Rate 12/22/16 0230 (!) 105     Resp 12/22/16 0230 (!) 22     Temp 12/22/16 0230 98.6 F (37 C)     Temp Source 12/22/16 0230 Oral     SpO2 12/22/16 0216 100 %     Pain Score 12/22/16 0220 10    Constitutional: Alert and oriented. Crying at times. Eyes: Conjunctivae are normal.  Head: Atraumatic. Nose: No congestion/rhinnorhea. Mouth/Throat: Mucous membranes are dry. Neck: No stridor.   Cardiovascular: Normal rate, regular rhythm. Good peripheral circulation. Grossly normal heart sounds.   Respiratory: Normal respiratory effort.  No retractions. Lungs CTAB. Gastrointestinal: Soft and nontender. No distention.  Musculoskeletal: No lower extremity tenderness nor edema. No gross deformities of extremities. Neurologic:  Normal speech and language. No gross focal neurologic deficits are appreciated.  Skin:  Skin is warm, dry and intact. No rash noted. Psychiatric: Mood and  affect are labile. Speech and behavior are normal.  ____________________________________________   LABS (all labs ordered are listed, but only abnormal results are displayed)  Labs Reviewed  BASIC METABOLIC PANEL - Abnormal; Notable for the following:       Result Value   Sodium 130 (*)    Chloride 94 (*)    Glucose, Bld 122 (*)    All other components within normal limits  CBC WITH DIFFERENTIAL/PLATELET -  Abnormal; Notable for the following:    WBC 13.0 (*)    MCH 25.9 (*)    Platelets 438 (*)    Neutro Abs 9.0 (*)    All other components within normal limits  CULTURE, BLOOD (ROUTINE X 2)  CULTURE, BLOOD (ROUTINE X 2)   ____________________________________________  RADIOLOGY  None ____________________________________________   PROCEDURES  Procedure(s) performed:   Procedures  None ____________________________________________   INITIAL IMPRESSION / ASSESSMENT AND PLAN / ED COURSE  Pertinent labs & imaging results that were available during my care of the patient were reviewed by me and considered in my medical decision making (see chart for details).  Patient returns to the emergency department for evaluation of severe left hip and lower back pain. Yesterday the patient left the hospital [place] after being admitted for sepsis and subsequently found to have MSSA bacteremia and evidence of MRI to suggest septic left hip and osteomyelitis and myositis. She denies fevers since leaving [place] but does endorse some chills. She complained mostly of severe lower back and left hip pain. Per nursing staff show after my evaluation the patient was found to be concealing a syringe of unknown liquid. She escorted the liquid into her mouth prior to identifying it. I initially ordered oral Percocets but canceled this because I'm unsure if she gave herself a dose of opiate medication syringe. I do not want to cause respiratory depression or hypotension. In review of yesterday's progress note the patient was on IV cefazolin and the plan was to have the patient placed in a SNF for IV abx. Plan to repeat labs and blood cultures today. Will discuss case with the hospitalist.   Discussed patient's case with hospitalist, Dr. Hal Hope. Patient and family (if present) updated with plan. Care transferred to hospitalist service.  I reviewed all nursing notes, vitals,  pertinent old records, EKGs, labs, imaging (as available).  ____________________________________________  FINAL CLINICAL IMPRESSION(S) / ED DIAGNOSES  Final diagnoses:  Bacteremia  Left hip pain     MEDICATIONS GIVEN DURING THIS VISIT:  Medications  ceFAZolin (ANCEF) IVPB 2g/100 mL premix (not administered)     NEW OUTPATIENT MEDICATIONS STARTED DURING THIS VISIT:  None   Note:  This document was prepared using Dragon voice recognition software and may include unintentional dictation errors.  Nanda Quinton, MD Emergency Medicine   Margette Fast, MD 12/22/16 [phone]

## 2016-12-23 DIAGNOSIS — M25552 Pain in left hip: Secondary | ICD-10-CM

## 2016-12-23 LAB — CBC
HEMATOCRIT: 38 % (ref 36.0–46.0)
Hemoglobin: 12.4 g/dL (ref 12.0–15.0)
MCH: 25.7 pg — ABNORMAL LOW (ref 26.0–34.0)
MCHC: 32.6 g/dL (ref 30.0–36.0)
MCV: 78.8 fL (ref 78.0–100.0)
PLATELETS: 390 10*3/uL (ref 150–400)
RBC: 4.82 MIL/uL (ref 3.87–5.11)
RDW: 14.4 % (ref 11.5–15.5)
WBC: 7.8 10*3/uL (ref 4.0–10.5)

## 2016-12-23 LAB — BASIC METABOLIC PANEL
Anion gap: 7 (ref 5–15)
BUN: 16 mg/dL (ref 6–20)
CALCIUM: 8.7 mg/dL — AB (ref 8.9–10.3)
CO2: 25 mmol/L (ref 22–32)
CREATININE: 0.55 mg/dL (ref 0.44–1.00)
Chloride: 102 mmol/L (ref 101–111)
GFR calc Af Amer: >60 mL/min (ref >60)
GLUCOSE: 110 mg/dL — AB (ref 65–99)
Potassium: 4.6 mmol/L (ref 3.5–5.1)
Sodium: 134 mmol/L — ABNORMAL LOW (ref 135–145)

## 2016-12-23 MED ORDER — METHOCARBAMOL 500 MG PO TABS
500.0000 mg | ORAL_TABLET | Freq: Four times a day (QID) | ORAL | Status: DC | PRN
  Administered 2016-12-23: 500 mg via ORAL
  Filled 2016-12-23: qty 1

## 2016-12-23 MED ORDER — SODIUM CHLORIDE 0.9% FLUSH: 10.0000 mL | INTRAVENOUS | Status: DC | PRN

## 2016-12-23 NOTE — Progress Notes (Signed)
Peripherally Inserted Central Catheter/Midline Placement  The IV Nurse has discussed with the patient and/or persons authorized to consent for the patient, the purpose of this procedure and the potential benefits and risks involved with this procedure.  The benefits include less needle sticks, lab draws from the catheter, and the patient may be discharged home with the catheter. Risks include, but not limited to, infection, bleeding, blood clot (thrombus formation), and puncture of an artery; nerve damage and irregular heartbeat and possibility to perform a PICC exchange if needed/ordered by physician.  Alternatives to this procedure were also discussed.  Bard Power PICC patient education guide, fact sheet on infection prevention and patient information card has been provided to patient /or left at bedside.    PICC/Midline Placement Documentation  PICC Single Lumen 30/09/79 PICC Left Basilic 45 cm 1 cm (Active)  Indication for Insertion or Continuance of Line Home intravenous therapies (PICC only) 12/23/2016  9:10 PM  Exposed Catheter (cm) 1 cm 12/23/2016  9:10 PM  Site Assessment Clean;Dry;Intact 12/23/2016  9:10 PM  Line Status Blood return noted;Flushed;Saline locked 12/23/2016  9:10 PM  Dressing Type Transparent 12/23/2016  9:10 PM  Dressing Status Clean;Dry;Intact;Antimicrobial disc in place 12/23/2016  9:10 PM  Dressing Intervention New dressing 12/23/2016  9:10 PM  Dressing Change Due 12/30/16 12/23/2016  9:10 PM       Aldona Lento L 12/23/2016, 9:23 PM

## 2016-12-23 NOTE — Evaluation (Signed)
Occupational Therapy Evaluation Patient Details Name: Holly Weber MRN: [id number] DOB: [birth year] Today's Date: 12/23/2016    History of Present Illness Patient is a 51 yo female admitted 12/22/16 for severe L hip and back pain. Also admitted recently on 12/13/16 with severe back pain with Lt sided sciatica, HTN, developed fever, pna.    PMH:  Hep C, depression, CVA, cocaine abuse, tobacco use   Clinical Impression   PTA, pt lived with two roommates in an apartment and was independent. Currently, pt performs ADLs near baseline and at Mod I level with increased time and RW. Pt performed LB dressing, toileting, toilet hygiene, and grooming at sink. Pt reports that she performed her own shower and dressing prior to OT visit. Educated pt on RW management and safety; pt verbalized understanding. Recommend pt dc home once medically stable per physician. Answered all questions and provided education. Will sign off acute OT.    Follow Up Recommendations  No OT follow up;Supervision - Intermittent    Equipment Recommendations  3 in 1 bedside commode    Recommendations for Other Services       Precautions / Restrictions Precautions Precautions: Fall Restrictions Weight Bearing Restrictions: No      Mobility Bed Mobility Overal bed mobility: Needs Assistance Bed Mobility: Supine to Sit     Supine to sit: Supervision;HOB elevated     General bed mobility comments: Pt slow to move from supine to EOB but able to perform without physical A  Transfers Overall transfer level: Modified independent Equipment used: Rolling walker (2 wheeled) Transfers: Sit to/from Stand Sit to Stand: Supervision         General transfer comment: Pt benefits from VCs for hand placement    Balance Overall balance assessment: Needs assistance Sitting-balance support: No upper extremity supported;Feet supported Sitting balance-Leahy Scale: Good Sitting balance - Comments: Able to don/dof socks by  bringing ankle to knee while maintaining balance   Standing balance support: Bilateral upper extremity supported;During functional activity Standing balance-Leahy Scale: Fair Standing balance comment: Able to perform grooming at sink                           ADL either performed or assessed with clinical judgement   ADL Overall ADL's : Modified independent;At baseline                                       General ADL Comments: Pt presents with near baseline function and performed toileting, grooming, and functional mobility with a Mod I level with increased time and RW     Vision         Perception     Praxis      Pertinent Vitals/Pain Pain Assessment: 0-10 Pain Score: 7  Pain Location: Back, Lt hip and Lt leg Pain Descriptors / Indicators: Sharp Pain Intervention(s): Monitored during session     Hand Dominance Right   Extremity/Trunk Assessment Upper Extremity Assessment Upper Extremity Assessment: Overall WFL for tasks assessed   Lower Extremity Assessment Lower Extremity Assessment: Overall WFL for tasks assessed;LLE deficits/detail LLE Deficits / Details: Presents with limp during functional mobility   Cervical / Trunk Assessment Cervical / Trunk Assessment: Normal   Communication Communication Communication: No difficulties   Cognition Arousal/Alertness: Awake/alert Behavior During Therapy: WFL for tasks assessed/performed Overall Cognitive Status: Within Functional Limits for tasks assessed  General Comments       Exercises     Shoulder Instructions      Home Living Family/patient expects to be discharged to:: Private residence Living Arrangements: Non-relatives/Friends (two friend) Available Help at Discharge: Friend(s);Available PRN/intermittently Type of Home: Apartment Home Access: Stairs to enter Entrance Stairs-Number of Steps: 14 Entrance Stairs-Rails:  Right;Left Home Layout: One level     Bathroom Shower/Tub: Corporate investment banker: Standard     Home Equipment: None          Prior Functioning/Environment Level of Independence: Independent                 OT Problem List: Decreased strength;Decreased activity tolerance;Impaired balance (sitting and/or standing);Pain      OT Treatment/Interventions:      OT Goals(Current goals can be found in the care plan section) Acute Rehab OT Goals Patient Stated Goal: Get better and get home OT Goal Formulation: With patient Time For Goal Achievement: 01/06/17 Potential to Achieve Goals: Good  OT Frequency:     Barriers to D/C:            Co-evaluation              End of Session Equipment Utilized During Treatment: Rolling walker Nurse Communication: Mobility status  Activity Tolerance: Patient tolerated treatment well Patient left: in chair;with call bell/phone within reach  OT Visit Diagnosis: Unsteadiness on feet (R26.81);Muscle weakness (generalized) (M62.81);Pain Pain - Right/Left: Left Pain - part of body: Leg                Time: 1027-2536 OT Time Calculation (min): 21 min Charges:  OT General Charges $OT Visit: 1 Procedure OT Evaluation $OT Eval Low Complexity: 1 Procedure G-Codes:     Renisha Cockrum, OTR/L [phone]  [employer] 12/23/2016, 3:06 PM

## 2016-12-23 NOTE — Progress Notes (Signed)
Patient state her dad is in the ED and is going to be admitted to the hospital. Patient stated she has to see him after he is admitted.

## 2016-12-23 NOTE — Progress Notes (Signed)
Patient took off without telling anyone. Disconnected her IV fluids. Will try to find patient.

## 2016-12-23 NOTE — Evaluation (Signed)
Physical Therapy Evaluation Patient Details Name: Holly Weber MRN: [id number] DOB: Dec 24, 1965 Today's Date: 12/23/2016   History of Present Illness  Pt is a 51 y.o. female with medical history significant of ulcerative colitis, depression, hepatitis C, hypertension, CVA, hypothyroidism. Patient presenting with persistent left hip and back pain. She was originally admitted on 12/13/2016 and found to have MSSA bacteremia with left hip osteomyelitis and myositis. At time of her initial presentation patient was septic. Currently patient's only complaint is worsening left hip pain.  Clinical Impression  Pt modified independent in room, supervision for mobility on unit using RW to manage left hip pain.  Pt tearful when talking about her left hip as she is worried about amputation of leg if infection does not resolve.  Pt appears to be motivated.  Will continue to follow patient while on this venue of care to progress mobility. She is hopeful to discharge to SNF upon discharge from hospital for infection management.    Follow Up Recommendations SNF;Supervision - Intermittent    Equipment Recommendations  Rolling walker with 5" wheels;3in1 (PT)    Recommendations for Other Services OT consult     Precautions / Restrictions Precautions Precautions: Fall Precaution Comments: reliance on external support during mobility to manage pain Restrictions Weight Bearing Restrictions: No      Mobility  Bed Mobility Overal bed mobility: Modified Independent Bed Mobility: Supine to Sit;Sit to Supine     Supine to sit: Modified independent (Device/Increase time);HOB elevated (use of rail) Sit to supine: Modified independent (Device/Increase time);HOB elevated (use of rail)   General bed mobility comments: Pt slow to move from supine to EOB but able to perform without physical A  Transfers Overall transfer level: Modified independent Equipment used: Rolling walker (2 wheeled) Transfers: Sit  to/from Stand Sit to Stand: Supervision         General transfer comment: supervision with use of rail from toilet, independent from bed  Ambulation/Gait Ambulation/Gait assistance: Supervision Ambulation Distance (Feet): 150 Feet Assistive device: Rolling walker (2 wheeled) Gait Pattern/deviations: Step-to pattern;Antalgic;Decreased dorsiflexion - left     General Gait Details: difficulty with full weight bearing left hip in stance phase  Stairs            Wheelchair Mobility    Modified Rankin (Stroke Patients Only)       Balance Overall balance assessment: Needs assistance Sitting-balance support: No upper extremity supported;Feet supported Sitting balance-Leahy Scale: Good Sitting balance - Comments: Able to don/dof socks by bringing ankle to knee while maintaining balance   Standing balance support: Bilateral upper extremity supported;During functional activity Standing balance-Leahy Scale: Fair Standing balance comment: able to perform static standing task without external support including during clothing manipulation after toileting                             Pertinent Vitals/Pain Pain Assessment: 0-10 Pain Score: 6  Pain Location: left hip Pain Descriptors / Indicators: Aching;Sharp;Sore Pain Intervention(s): Limited activity within patient's tolerance;Monitored during session;Heat applied    Home Living Family/patient expects to be discharged to:: Private residence Living Arrangements: Non-relatives/Friends (two friends) Available Help at Discharge: Friend(s);Available PRN/intermittently (she is alone during day) Type of Home: Apartment Home Access: Stairs to enter Entrance Stairs-Rails: Right;Left Entrance Stairs-Number of Steps: 14 Home Layout: One level Home Equipment: None Additional Comments: she was responsible for household chores since her friends worked during day    Prior Function Level of Independence: Independent  Hand Dominance   Dominant Hand: Right    Extremity/Trunk Assessment   Upper Extremity Assessment Upper Extremity Assessment: Overall WFL for tasks assessed    Lower Extremity Assessment Lower Extremity Assessment: Generalized weakness LLE Deficits / Details: antalgic gait pattern, limited by pain in groin area and left buttock    Cervical / Trunk Assessment Cervical / Trunk Assessment: Normal  Communication   Communication: No difficulties  Cognition Arousal/Alertness: Awake/alert Behavior During Therapy: WFL for tasks assessed/performed Overall Cognitive Status: Within Functional Limits for tasks assessed                                        General Comments General comments (skin integrity, edema, etc.): applied heat packs left hip to manage discomfort in bed    Exercises     Assessment/Plan    PT Assessment Patient needs continued PT services  PT Problem List Decreased strength;Decreased activity tolerance;Decreased balance;Decreased mobility;Decreased knowledge of use of DME;Cardiopulmonary status limiting activity;Pain       PT Treatment Interventions DME instruction;Gait training;Stair training;Functional mobility training;Therapeutic activities;Therapeutic exercise;Balance training;Patient/family education;Modalities    PT Goals (Current goals can be found in the Care Plan section)  Acute Rehab PT Goals Patient Stated Goal: be able to keep left leg, infection get better PT Goal Formulation: With patient Time For Goal Achievement: 01/02/17 Potential to Achieve Goals: Good    Frequency Min 2X/week   Barriers to discharge Decreased caregiver support alone during day    Co-evaluation               End of Session Equipment Utilized During Treatment:  (pt denied use of gait belt) Activity Tolerance: Patient limited by pain Patient left: with call bell/phone within reach;in bed;with family/visitor present Nurse  Communication: Mobility status;Patient requests pain meds PT Visit Diagnosis: Other abnormalities of gait and mobility (R26.89);Pain Pain - Right/Left: Left Pain - part of body: Hip    Time: 9024-0973 PT Time Calculation (min) (ACUTE ONLY): 27 min   Charges:   PT Evaluation $PT Eval Low Complexity: 1 Procedure PT Treatments $Gait Training: 8-22 mins   PT G CodesSuszanne Weber, PT [phone]  Holly Weber 12/23/2016, 5:10 PM

## 2016-12-23 NOTE — Progress Notes (Signed)
PROGRESS NOTE    Holly Weber  VOZ:[id number] DOB: May 11, 1966 DOA: 12/22/2016 PCP: No PCP Per Patient   Brief Narrative:   51 y.o. female with medical history significant of ulcerative colitis, depression, hepatitis, hypertension, CVA, hypothyroidism. Patient presenting with persistent left hip and back pain. Of note patient left the hospital less than 24 hours ago AMA. She was originally admitted on 12/13/2016 and found to have MSSA bacteremia with left hip osteomyelitis and myositis. At time of her initial presentation patient was septic. Currently patient's only complaint is worsening left hip pain. Denies any drug use since leaving the hospital or since coming into the hospital   Assessment & Plan:    Staphylococcus aureus bacteremia - Will place order for PICC line placement as patient will require take for prolonged antibiotic treatment  Active Problems:   Tobacco abuse - Continue nicotine replacement    HTN (hypertension) -Stable    Hyperglycemia - place on diabetic diet    Polysubstance abuse   Osteomyelitis ([place]) -Continue current antibiotic regimen - please refer to prior evaluation from ID (patient signed out ama)    Hyponatremia -Mild, should improve with continued improvement in oral intake  DVT prophylaxis: Lovenox Code Status: Full Family Communication: none at bedside Disposition Plan: Once patient has PICC line and resources may consider discharging to SNF for continued antibiotic administration   Consultants:   None   Procedures: None   Antimicrobials: Cefazolin   Subjective: Pt has no new complaints. No acute issues overnight.  Objective: Vitals:   12/22/16 1500 12/22/16 2116 12/23/16 0650 12/23/16 0900  BP:  135/70 118/78 122/71  Pulse:  95 76 80  Resp:   15 16  Temp:  98.4 F (36.9 C) 97.7 F (36.5 C) 97.8 F (36.6 C)  TempSrc:  Oral Oral Oral  SpO2:  98% 100% 97%  Weight:  73 kg (161 lb)    Height: 5' 8"  (1.727 m)        Intake/Output Summary (Last 24 hours) at 12/23/16 1707 Last data filed at 12/23/16 1421  Gross per 24 hour  Intake             1260 ml  Output                0 ml  Net             1260 ml   Filed Weights   12/22/16 2116  Weight: 73 kg (161 lb)    Examination:  General exam: Appears calm and comfortable, in nad. Respiratory system: Clear to auscultation. Respiratory effort normal. Cardiovascular system: S1 & S2 heard, RRR. No JVD Gastrointestinal system: Abdomen is nondistended, soft and nontender. No organomegaly or masses felt. Normal bowel sounds heard. Central nervous system: Alert and oriented. No focal neurological deficits. Extremities: Symmetric 5 x 5 power. Skin: No rashes, lesions or ulcers, on limited exam. Psychiatry:  Mood & affect appropriate.   Data Reviewed: I have personally reviewed following labs and imaging studies  CBC:  Recent Labs Lab 12/17/16 0647 12/18/16 0619 12/22/16 0428 12/23/16 0811  WBC 6.7 6.7 13.0* 7.8  NEUTROABS  --   --  9.0*  --   HGB 12.3 12.3 12.9 12.4  HCT 36.7 37.4 38.9 38.0  MCV 78.1 77.6* 78.1 78.8  PLT 180 203 438* 425   Basic Metabolic Panel:  Recent Labs Lab 12/17/16 0647 12/18/16 0619 12/20/16 0526 12/22/16 0428 12/23/16 0811  NA 131* 132*  --  130* 134*  K  3.7 4.0  --  4.6 4.6  CL 92* 96*  --  94* 102  CO2 27 28  --  24 25  GLUCOSE 115* 147*  --  122* 110*  BUN 9 10  --  11 16  CREATININE 0.54 0.55 0.57 0.53 0.55  CALCIUM 8.4* 8.4*  --  9.0 8.7*   GFR: Estimated Creatinine Clearance: 85.8 mL/min (by C-G formula based on SCr of 0.55 mg/dL). Liver Function Tests: No results for input(s): AST, ALT, ALKPHOS, BILITOT, PROT, ALBUMIN in the last 168 hours. No results for input(s): LIPASE, AMYLASE in the last 168 hours. No results for input(s): AMMONIA in the last 168 hours. Coagulation Profile: No results for input(s): INR, PROTIME in the last 168 hours. Cardiac Enzymes: No results for input(s): CKTOTAL,  CKMB, CKMBINDEX, TROPONINI in the last 168 hours. BNP (last 3 results) No results for input(s): PROBNP in the last 8760 hours. HbA1C: No results for input(s): HGBA1C in the last 72 hours. CBG:  Recent Labs Lab 12/22/16 1716  GLUCAP 131*   Lipid Profile: No results for input(s): CHOL, HDL, LDLCALC, TRIG, CHOLHDL, LDLDIRECT in the last 72 hours. Thyroid Function Tests: No results for input(s): TSH, T4TOTAL, FREET4, T3FREE, THYROIDAB in the last 72 hours. Anemia Panel: No results for input(s): VITAMINB12, FOLATE, FERRITIN, TIBC, IRON, RETICCTPCT in the last 72 hours. Sepsis Labs: No results for input(s): PROCALCITON, LATICACIDVEN in the last 168 hours.  Recent Results (from the past 240 hour(s))  Culture, blood (x 2)     Status: Abnormal   Collection Time: 12/13/16 11:22 PM  Result Value Ref Range Status   Specimen Description BLOOD LEFT ANTECUBITAL  Final   Special Requests BOTTLES DRAWN AEROBIC AND ANAEROBIC 5CC  Final   Culture  Setup Time   Final    GRAM POSITIVE COCCI IN CLUSTERS IN BOTH AEROBIC AND ANAEROBIC BOTTLES CRITICAL RESULT CALLED TO, READ BACK BY AND VERIFIED WITH: Hughie Closs, [place], 12/14/16 AT 68 BY J FUDESCO    Culture STAPHYLOCOCCUS AUREUS (A)  Final   Report Status 12/16/2016 FINAL  Final   Organism ID, Bacteria STAPHYLOCOCCUS AUREUS  Final      Susceptibility   Staphylococcus aureus - MIC*    CIPROFLOXACIN 2 INTERMEDIATE Intermediate     ERYTHROMYCIN RESISTANT Resistant     GENTAMICIN <=0.5 SENSITIVE Sensitive     OXACILLIN 0.5 SENSITIVE Sensitive     TETRACYCLINE <=1 SENSITIVE Sensitive     VANCOMYCIN <=0.5 SENSITIVE Sensitive     TRIMETH/SULFA <=10 SENSITIVE Sensitive     CLINDAMYCIN RESISTANT Resistant     RIFAMPIN <=0.5 SENSITIVE Sensitive     Inducible Clindamycin POSITIVE Resistant     * STAPHYLOCOCCUS AUREUS  Culture, blood (x 2)     Status: Abnormal   Collection Time: 12/13/16 11:22 PM  Result Value Ref Range Status   Specimen Description  BLOOD LEFT ANTECUBITAL  Final   Special Requests BOTTLES DRAWN AEROBIC AND ANAEROBIC 5CC  Final   Culture  Setup Time   Final    GRAM POSITIVE COCCI IN CLUSTERS IN BOTH AEROBIC AND ANAEROBIC BOTTLES CRITICAL VALUE NOTED.  VALUE IS CONSISTENT WITH PREVIOUSLY REPORTED AND CALLED VALUE.    Culture (A)  Final    STAPHYLOCOCCUS AUREUS SUSCEPTIBILITIES PERFORMED ON PREVIOUS CULTURE WITHIN THE LAST 5 DAYS.    Report Status 12/16/2016 FINAL  Final  Blood Culture ID Panel (Reflexed)     Status: Abnormal   Collection Time: 12/13/16 11:22 PM  Result Value Ref Range Status  Enterococcus species NOT DETECTED NOT DETECTED Final   Listeria monocytogenes NOT DETECTED NOT DETECTED Final   Staphylococcus species DETECTED (A) NOT DETECTED Final    Comment: CRITICAL RESULT CALLED TO, READ BACK BY AND VERIFIED WITH: Hughie Closs [facility] [phone] AT 1711 BY J FUDESC    Staphylococcus aureus DETECTED (A) NOT DETECTED Final    Comment: Methicillin (oxacillin) susceptible Staphylococcus aureus (MSSA). Preferred therapy is anti staphylococcal beta lactam antibiotic (Cefazolin or Nafcillin), unless clinically contraindicated. CRITICAL RESULT CALLED TO, READ BACK BY AND VERIFIED WITH: Hughie Closs, PHARM, 12/14/16 AT 1711 BY J FUDESCO    Methicillin resistance NOT DETECTED NOT DETECTED Final   Streptococcus species NOT DETECTED NOT DETECTED Final   Streptococcus agalactiae NOT DETECTED NOT DETECTED Final   Streptococcus pneumoniae NOT DETECTED NOT DETECTED Final   Streptococcus pyogenes NOT DETECTED NOT DETECTED Final   Acinetobacter baumannii NOT DETECTED NOT DETECTED Final   Enterobacteriaceae species NOT DETECTED NOT DETECTED Final   Enterobacter cloacae complex NOT DETECTED NOT DETECTED Final   Escherichia coli NOT DETECTED NOT DETECTED Final   Klebsiella oxytoca NOT DETECTED NOT DETECTED Final   Klebsiella pneumoniae NOT DETECTED NOT DETECTED Final   Proteus species NOT DETECTED NOT DETECTED Final   Serratia  marcescens NOT DETECTED NOT DETECTED Final   Haemophilus influenzae NOT DETECTED NOT DETECTED Final   Neisseria meningitidis NOT DETECTED NOT DETECTED Final   Pseudomonas aeruginosa NOT DETECTED NOT DETECTED Final   Candida albicans NOT DETECTED NOT DETECTED Final   Candida glabrata NOT DETECTED NOT DETECTED Final   Candida krusei NOT DETECTED NOT DETECTED Final   Candida parapsilosis NOT DETECTED NOT DETECTED Final   Candida tropicalis NOT DETECTED NOT DETECTED Final  Culture, blood (routine x 2)     Status: Abnormal   Collection Time: 12/15/16  9:48 AM  Result Value Ref Range Status   Specimen Description BLOOD BLOOD RIGHT HAND  Final   Special Requests IN PEDIATRIC BOTTLE 2.5CC  Final   Culture  Setup Time   Final    GRAM POSITIVE COCCI IN CLUSTERS IN PEDIATRIC BOTTLE CRITICAL VALUE NOTED.  VALUE IS CONSISTENT WITH PREVIOUSLY REPORTED AND CALLED VALUE.    Culture (A)  Final    STAPHYLOCOCCUS AUREUS SUSCEPTIBILITIES PERFORMED ON PREVIOUS CULTURE WITHIN THE LAST 5 DAYS.    Report Status 12/17/2016 FINAL  Final  Culture, blood (routine x 2)     Status: None   Collection Time: 12/15/16  9:48 AM  Result Value Ref Range Status   Specimen Description BLOOD BLOOD LEFT HAND  Final   Special Requests IN PEDIATRIC BOTTLE 1CC  Final   Culture NO GROWTH 5 DAYS  Final   Report Status 12/20/2016 FINAL  Final  Culture, blood (routine x 2)     Status: None   Collection Time: 12/16/16  1:00 PM  Result Value Ref Range Status   Specimen Description BLOOD RIGHT HAND  Final   Special Requests IN PEDIATRIC BOTTLE  1.5 CC  Final   Culture NO GROWTH 5 DAYS  Final   Report Status 12/21/2016 FINAL  Final  Culture, blood (routine x 2)     Status: None   Collection Time: 12/16/16  1:15 PM  Result Value Ref Range Status   Specimen Description BLOOD LEFT ARM  Final   Special Requests IN PEDIATRIC BOTTLE  1.5  Final   Culture NO GROWTH 5 DAYS  Final   Report Status 12/21/2016 FINAL  Final  Culture,  blood (routine x 2)  Status: None (Preliminary result)   Collection Time: 12/22/16  4:30 AM  Result Value Ref Range Status   Specimen Description BLOOD RIGHT HAND  Final   Special Requests IN PEDIATRIC BOTTLE Blood Culture adequate volume  Final   Culture NO GROWTH 1 DAY  Final   Report Status PENDING  Incomplete  Culture, blood (routine x 2)     Status: None (Preliminary result)   Collection Time: 12/22/16  4:45 AM  Result Value Ref Range Status   Specimen Description BLOOD RIGHT FOREARM  Final   Special Requests IN PEDIATRIC BOTTLE Blood Culture adequate volume  Final   Culture NO GROWTH 1 DAY  Final   Report Status PENDING  Incomplete     Radiology Studies: No results found.   Scheduled Meds: .  ceFAZolin (ANCEF) IV  2 g Intravenous Q8H  . enoxaparin (LOVENOX) injection  40 mg Subcutaneous Q24H  . nicotine  21 mg Transdermal Daily   Continuous Infusions:   LOS: 1 day    Time spent: > 35 minutes  Velvet Bathe, MD [facility] Pager [phone]  If 7PM-7AM, please contact night-coverage [email or web address] Password [email or web address] 12/23/2016, 5:07 PM

## 2016-12-24 NOTE — Progress Notes (Signed)
Called patient cell phone no answer. Will try again.

## 2016-12-24 NOTE — Progress Notes (Signed)
Called [facility] where her father is admitted. Ms Virag is nowhere to be found. Security alerted.

## 2016-12-24 NOTE — Progress Notes (Signed)
Patient still unaccounted for. NP aware that patient is not back in her room at this time.

## 2016-12-24 NOTE — Progress Notes (Signed)
NP paged. Awaiting return call.

## 2016-12-24 NOTE — Progress Notes (Addendum)
RN, Jimmie Molly, paged NP earlier stating pt had eloped from nsg unit and security was looking for her. NP called RN back later, and security still hasn't located pt. Concern for drug use with PICC line because pt has hx of drug abuse and had cocaine on UDS on admission. Continuing search. KJKG, NP [facility] Update: pt still not located as of 0630. Pt officially discharged from unit.  KJKG, NP [facility]

## 2016-12-27 ENCOUNTER — Encounter (HOSPITAL_COMMUNITY): Payer: Self-pay | Admitting: Emergency Medicine

## 2016-12-27 ENCOUNTER — Inpatient Hospital Stay (HOSPITAL_COMMUNITY)
Admission: EM | Admit: 2016-12-27 | Discharge: 2016-12-30 | DRG: 540 | Disposition: A | Discharge status: Home Health | Payer: Self-pay | Attending: Internal Medicine | Admitting: Internal Medicine

## 2016-12-27 DIAGNOSIS — R7881 Bacteremia: Secondary | ICD-10-CM | POA: Diagnosis present

## 2016-12-27 DIAGNOSIS — F1990 Other psychoactive substance use, unspecified, uncomplicated: Secondary | ICD-10-CM | POA: Diagnosis present

## 2016-12-27 DIAGNOSIS — A4901 Methicillin susceptible Staphylococcus aureus infection, unspecified site: Secondary | ICD-10-CM | POA: Diagnosis present

## 2016-12-27 DIAGNOSIS — F1910 Other psychoactive substance abuse, uncomplicated: Secondary | ICD-10-CM | POA: Diagnosis present

## 2016-12-27 DIAGNOSIS — F329 Major depressive disorder, single episode, unspecified: Secondary | ICD-10-CM | POA: Diagnosis present

## 2016-12-27 DIAGNOSIS — M869 Osteomyelitis, unspecified: Principal | ICD-10-CM | POA: Diagnosis present

## 2016-12-27 DIAGNOSIS — Z765 Malingerer [conscious simulation]: Secondary | ICD-10-CM

## 2016-12-27 DIAGNOSIS — K5190 Ulcerative colitis, unspecified, without complications: Secondary | ICD-10-CM | POA: Diagnosis present

## 2016-12-27 DIAGNOSIS — E039 Hypothyroidism, unspecified: Secondary | ICD-10-CM | POA: Diagnosis present

## 2016-12-27 DIAGNOSIS — M009 Pyogenic arthritis, unspecified: Secondary | ICD-10-CM | POA: Diagnosis present

## 2016-12-27 DIAGNOSIS — F1210 Cannabis abuse, uncomplicated: Secondary | ICD-10-CM | POA: Diagnosis present

## 2016-12-27 DIAGNOSIS — Z8673 Personal history of transient ischemic attack (TIA), and cerebral infarction without residual deficits: Secondary | ICD-10-CM

## 2016-12-27 DIAGNOSIS — R739 Hyperglycemia, unspecified: Secondary | ICD-10-CM | POA: Diagnosis present

## 2016-12-27 DIAGNOSIS — Z880 Allergy status to penicillin: Secondary | ICD-10-CM

## 2016-12-27 DIAGNOSIS — R7989 Other specified abnormal findings of blood chemistry: Secondary | ICD-10-CM | POA: Diagnosis present

## 2016-12-27 DIAGNOSIS — E876 Hypokalemia: Secondary | ICD-10-CM | POA: Diagnosis present

## 2016-12-27 DIAGNOSIS — F1410 Cocaine abuse, uncomplicated: Secondary | ICD-10-CM | POA: Diagnosis present

## 2016-12-27 DIAGNOSIS — I10 Essential (primary) hypertension: Secondary | ICD-10-CM | POA: Diagnosis present

## 2016-12-27 DIAGNOSIS — Z9119 Patient's noncompliance with other medical treatment and regimen: Secondary | ICD-10-CM

## 2016-12-27 DIAGNOSIS — M25552 Pain in left hip: Secondary | ICD-10-CM

## 2016-12-27 DIAGNOSIS — E871 Hypo-osmolality and hyponatremia: Secondary | ICD-10-CM | POA: Diagnosis present

## 2016-12-27 LAB — CBC
HCT: 38.3 % (ref 36.0–46.0)
Hemoglobin: 12.3 g/dL (ref 12.0–15.0)
MCH: 25.5 pg — ABNORMAL LOW (ref 26.0–34.0)
MCHC: 32.1 g/dL (ref 30.0–36.0)
MCV: 79.3 fL (ref 78.0–100.0)
PLATELETS: 600 10*3/uL — AB (ref 150–400)
RBC: 4.83 MIL/uL (ref 3.87–5.11)
RDW: 13.8 % (ref 11.5–15.5)
WBC: 9.7 10*3/uL (ref 4.0–10.5)

## 2016-12-27 LAB — BASIC METABOLIC PANEL
Anion gap: 13 (ref 5–15)
BUN: 12 mg/dL (ref 6–20)
CALCIUM: 9.2 mg/dL (ref 8.9–10.3)
CO2: 24 mmol/L (ref 22–32)
Chloride: 98 mmol/L — ABNORMAL LOW (ref 101–111)
Creatinine, Ser: 0.68 mg/dL (ref 0.44–1.00)
GLUCOSE: 192 mg/dL — AB (ref 65–99)
POTASSIUM: 3.8 mmol/L (ref 3.5–5.1)
SODIUM: 135 mmol/L (ref 135–145)

## 2016-12-27 LAB — CULTURE, BLOOD (ROUTINE X 2)
CULTURE: NO GROWTH
Culture: NO GROWTH
SPECIAL REQUESTS: ADEQUATE
SPECIAL REQUESTS: ADEQUATE

## 2016-12-27 LAB — DRUG PROFILE, UR, 9 DRUGS (LABCORP)
AMPHETAMINES, URINE: NEGATIVE ng/mL
BENZODIAZEPINE QUANT UR: NEGATIVE ng/mL
Barbiturate, Ur: NEGATIVE ng/mL
CANNABINOID QUANT UR: POSITIVE — AB
Cocaine (Metab.): NEGATIVE ng/mL
METHADONE SCREEN, URINE: NEGATIVE ng/mL
Opiate Quant, Ur: POSITIVE — AB
PHENCYCLIDINE, UR: NEGATIVE ng/mL
Propoxyphene, Urine: NEGATIVE ng/mL

## 2016-12-27 MED ORDER — PNEUMOCOCCAL VAC POLYVALENT 25 MCG/0.5ML IJ INJ
0.5000 mL | INJECTION | INTRAMUSCULAR | Status: AC
  Administered 2016-12-29: 0.5 mL via INTRAMUSCULAR
  Filled 2016-12-27 (×2): qty 0.5

## 2016-12-27 MED ORDER — ACETAMINOPHEN 325 MG PO TABS: 650.0000 mg | ORAL_TABLET | Freq: Four times a day (QID) | ORAL | Status: DC | PRN

## 2016-12-27 MED ORDER — NYSTATIN 100000 UNIT/GM EX POWD
Freq: Three times a day (TID) | CUTANEOUS | Status: DC
  Administered 2016-12-27 – 2016-12-30 (×7): via TOPICAL
  Filled 2016-12-27: qty 15

## 2016-12-27 MED ORDER — DICLOFENAC SODIUM 1 % TD GEL
4.0000 g | Freq: Four times a day (QID) | TRANSDERMAL | Status: DC
  Administered 2016-12-27 – 2016-12-30 (×11): 4 g via TOPICAL
  Filled 2016-12-27: qty 100

## 2016-12-27 MED ORDER — NICOTINE 21 MG/24HR TD PT24
21.0000 mg | MEDICATED_PATCH | Freq: Every day | TRANSDERMAL | Status: DC
  Administered 2016-12-27 (×2): 21 mg via TRANSDERMAL
  Filled 2016-12-27 (×4): qty 1

## 2016-12-27 MED ORDER — ENOXAPARIN SODIUM 40 MG/0.4ML ~~LOC~~ SOLN
40.0000 mg | SUBCUTANEOUS | Status: DC
  Administered 2016-12-27 – 2016-12-29 (×3): 40 mg via SUBCUTANEOUS
  Filled 2016-12-27 (×3): qty 0.4

## 2016-12-27 MED ORDER — NICOTINE 21 MG/24HR TD PT24
21.0000 mg | MEDICATED_PATCH | Freq: Every day | TRANSDERMAL | Status: DC
  Administered 2016-12-28 – 2016-12-30 (×3): 21 mg via TRANSDERMAL
  Filled 2016-12-27 (×3): qty 1

## 2016-12-27 MED ORDER — METHOCARBAMOL 500 MG PO TABS
1000.0000 mg | ORAL_TABLET | Freq: Four times a day (QID) | ORAL | Status: DC
  Administered 2016-12-27 (×4): 1000 mg via ORAL
  Filled 2016-12-27 (×5): qty 2

## 2016-12-27 MED ORDER — LORAZEPAM 2 MG/ML IJ SOLN
0.5000 mg | Freq: Two times a day (BID) | INTRAMUSCULAR | Status: DC | PRN
  Administered 2016-12-27 – 2016-12-29 (×5): 0.5 mg via INTRAVENOUS
  Filled 2016-12-27 (×5): qty 1

## 2016-12-27 MED ORDER — CEFAZOLIN SODIUM-DEXTROSE 2-4 GM/100ML-% IV SOLN
2.0000 g | Freq: Three times a day (TID) | INTRAVENOUS | Status: DC
  Administered 2016-12-27 – 2016-12-29 (×7): 2 g via INTRAVENOUS
  Filled 2016-12-27 (×8): qty 100

## 2016-12-27 MED ORDER — TRAMADOL HCL 50 MG PO TABS
100.0000 mg | ORAL_TABLET | Freq: Four times a day (QID) | ORAL | Status: DC | PRN
  Administered 2016-12-27 – 2016-12-30 (×9): 100 mg via ORAL
  Filled 2016-12-27 (×10): qty 2

## 2016-12-27 MED ORDER — PREGABALIN 75 MG PO CAPS
75.0000 mg | ORAL_CAPSULE | Freq: Two times a day (BID) | ORAL | Status: DC
  Administered 2016-12-27 – 2016-12-30 (×7): 75 mg via ORAL
  Filled 2016-12-27: qty 1
  Filled 2016-12-27: qty 3
  Filled 2016-12-27 (×6): qty 1

## 2016-12-27 NOTE — ED Notes (Signed)
Pt arrives via EMS from home for chronic staph infection and pain in L hip. Recently in hospital and left AMA with PICC line in place "I got depressed" and "because of a man." States "it took me two days to come around but I did, please get me back in the hospital." pt states that her children have convinced her to return. States she has been trying to get back to hospital all day, states "my phone doesn't dial 911.". States that ambulance was called for her spouse and she asked EMS to take her instead. Pt arrives ambulatory with PICC in place, appears unused. No meds since she left hospital.    MD at bedside at this time.

## 2016-12-27 NOTE — ED Notes (Signed)
Pt admits to cocaine use and marijuana use while absent from hospital.

## 2016-12-27 NOTE — Progress Notes (Signed)
Patient admitted earlier this am by Dr Alcario Drought.   Holly Weber is a 51 y.o. female with medical history significant of ulcerative colitis, depression, hepatitis, hypertension, CVA, hypothyroidism presenting with persistent left hip and back pain,.  She was originally admitted on 12/13/2016 and found to have MSSA bacteremia with left hip osteomyelitis and myositis. She was put on Ancef with resolution of sepsis, further BCx during that admit and last admit were negative.  Patient first left the hospital AMA on 4/1 she was then readmitted 4/2 with positive UDS for cocaine and THC at that time and report of a syringe with clear substance confiscated from patient in ED.  She had a PICC line placed on 4/3, and then eloped for her second AMA on 4/4. She is back on 4/7 and reports taking cocaine and THC and her UDS is positive for both.  She is put back on the ancef and PICC line removed by the time I saw the patient.  Plan : Follow repeat blood cultures,  Continue with ancef via peripheral line.  D/c to SNF till she completes the antibiotics.  Consult ID if needed.    Hosie Poisson, MD [phone]

## 2016-12-27 NOTE — ED Provider Notes (Signed)
[facility] DEPT Provider Note   CSN: [id number] Arrival date & time: 12/27/16  0423     History   Chief Complaint Chief Complaint  Patient presents with  . Hip Pain    HPI Holly Weber is a 51 y.o. female.  Patient is a 51 year old female with past medical history of hepatitis C, hypertension, polysubstance abuse, and recent admissions for osteomyelitis of her left hip. She was receiving IV antibiotics through a PICC line as an inpatient. She has no eloped from the inpatient ward twice in the past several days for reasons she cannot explain to me. She tells me that she "has her mind right now" and would like to be readmitted to the hospital. She is reporting severe pain in her left hip.   The history is provided by the patient.  Hip Pain  This is a recurrent problem. The problem occurs constantly. The problem has not changed since onset.The symptoms are aggravated by walking. Nothing relieves the symptoms. She has tried nothing for the symptoms. The treatment provided no relief.    Past Medical History:  Diagnosis Date  . Colitis, ulcerative ([place])   . Depression   . Hepatitis C   . Hypertension   . Stroke ([place])   . Thyroid disease     Patient Active Problem List   Diagnosis Date Noted  . Bacteremia 12/22/2016  . Polysubstance abuse 12/22/2016  . Osteomyelitis ([place]) 12/22/2016  . Hyponatremia 12/22/2016  . Osteomyelitis of left hip ([place])   . Sacroiliitis ([place])   . Staphylococcus aureus bacteremia 12/15/2016  . Pneumonia 12/15/2016  . Hyperglycemia 12/15/2016  . Normocytic anemia 12/15/2016  . IV drug user 12/15/2016  . Acute left-sided low back pain with left-sided sciatica 12/13/2016  . Cocaine abuse 12/13/2016  . Tobacco abuse 12/13/2016  . HTN (hypertension) 12/13/2016    Past Surgical History:  Procedure Laterality Date  . ARM WOUND REPAIR / CLOSURE    . COLONOSCOPY    . TEE WITHOUT CARDIOVERSION N/A 12/17/2016   Procedure: TRANSESOPHAGEAL  ECHOCARDIOGRAM (TEE);  Surgeon: Josue Hector, MD;  [facility] ENDOSCOPY;  Service: Cardiovascular;  Laterality: N/A;  . TONSILLECTOMY      OB History    No data available       Home Medications    Prior to Admission medications   Not on File    Family History History reviewed. No pertinent family history.  Social History Social History  Substance Use Topics  . Smoking status: Current Every Day Smoker    Types: Cigarettes  . Smokeless tobacco: Never Used  . Alcohol use No     Allergies   Asa [aspirin]; Nsaids; and Penicillins   Review of Systems Review of Systems  All other systems reviewed and are negative.    Physical Exam Updated Vital Signs BP (!) 137/97   Pulse 100   Temp 98.4 F (36.9 C) (Oral)   Resp 18   Ht 5' 5"  (1.651 m)   Wt 160 lb (72.6 kg)   SpO2 100%   BMI 26.63 kg/m   Physical Exam  Constitutional: She is oriented to person, place, and time. She appears well-developed and well-nourished. No distress.  HENT:  Head: Normocephalic and atraumatic.  Neck: Normal range of motion. Neck supple.  Cardiovascular: Normal rate and regular rhythm.  Exam reveals no gallop and no friction rub.   No murmur heard. Pulmonary/Chest: Effort normal and breath sounds normal. No respiratory distress. She has no wheezes.  Abdominal: Soft.  Bowel sounds are normal. She exhibits no distension. There is no tenderness.  Musculoskeletal: Normal range of motion.  There is tenderness with palpation of the lateral aspect of the left hip. I see no redness or erythema. She has pain with range of motion. Distal PMS is intact.  Neurological: She is alert and oriented to person, place, and time.  Skin: Skin is warm and dry. She is not diaphoretic.  Nursing note and vitals reviewed.    ED Treatments / Results  Labs (all labs ordered are listed, but only abnormal results are displayed) Labs Reviewed - No data to display  EKG  EKG Interpretation None        Radiology No results found.  Procedures Procedures (including critical care time)  Medications Ordered in ED Medications - No data to display   Initial Impression / Assessment and Plan / ED Course  I have reviewed the triage vital signs and the nursing notes.  Pertinent labs & imaging results that were available during my care of the patient were reviewed by me and considered in my medical decision making (see chart for details).  Patient discussed with Dr. Alcario Drought from the hospitalist service who agrees to readmit the patient.  Final Clinical Impressions(s) / ED Diagnoses   Final diagnoses:  None    New Prescriptions New Prescriptions   No medications on file     Veryl Speak, MD 12/27/16 [phone]

## 2016-12-27 NOTE — Progress Notes (Signed)
Pharmacy Antibiotic Note  Holly Weber is a 51 y.o. female admitted on 12/27/2016 with MSSA bacteremia/osteomyelitis.  Pharmacy has been consulted to continue Cefazolin. Plan is for 6 weeks of anti-biotics with a stop date of Jan 25, 2017.   Plan: -Cefazolin 2g IV q8h to complete 6 weeks (01/25/17) -Trend WBC, temp, renal function   Temp (24hrs), Avg:98.4 F (36.9 C), Min:98.4 F (36.9 C), Max:98.4 F (36.9 C)   Recent Labs Lab 12/20/16 0526 12/22/16 0428 12/23/16 0811  WBC  --  13.0* 7.8  CREATININE 0.57 0.53 0.55    Estimated Creatinine Clearance: 84.9 mL/min (by C-G formula based on SCr of 0.55 mg/dL).    Sherlon Handing, PharmD, BCPS Clinical pharmacist, pager [phone] 12/27/2016 5:07 AM

## 2016-12-27 NOTE — Progress Notes (Signed)
Patient transported from ED to [place] about 0645. IV pump beeping occluded. Attempted to flush IV site unsuccessful. Ancef not complete. PIV in R hand discontinued with catheter intact. IV team notified for difficult start. Patient reports pain 8/10 L hip/buttocks/leg. VSS. Will continue to monitor. Bartholomew Crews, RN

## 2016-12-27 NOTE — H&P (Addendum)
History and Physical    GRAYLYN BUNNEY DPO:[id number] DOB: 06/24/1966 DOA: 12/27/2016  PCP: No PCP Per Patient Patient coming from: Home  Chief Complaint: L hip and back pain  HPI: Cindee DANAYA GEDDIS is a 51 y.o. female with medical history significant of ulcerative colitis, depression, hepatitis, hypertension, CVA, hypothyroidism. Patient presenting with persistent left hip and back pain. Of note patient has now left the hospital AMA twice from our service, last on 4/4.  She was originally admitted on 12/13/2016 and found to have MSSA bacteremia with left hip osteomyelitis and myositis.  At time of her initial presentation patient was septic.  She was put on Ancef with resolution of sepsis, further BCx during that admit and last admit were negative.  Patient first left the hospital AMA on 4/1 she was then readmitted 4/2 with positive UDS for cocaine and THC at that time and report of a syringe with clear substance confiscated from patient in ED.  She had a PICC line placed on 4/3, and then eloped for her second AMA on 4/4.  She now returns to the ED on 4/7.  Is willing to admit that she has been doing cocaine and THC at home in the interval.  Currently patient denies any fevers, chills, shortness breath, palpitations, nausea, vomiting, chest pain, nuchal rigidity, headache, LOC, dizziness, focal neurological deficit.  ED Course: Restarted on Ancef  Review of Systems: As per HPI otherwise 10 point review of systems negative.   Ambulatory Status:no restrictions  Past Medical History:  Diagnosis Date  . Colitis, ulcerative ([place])   . Depression   . Hepatitis C   . Hypertension   . Stroke ([place])   . Thyroid disease     Past Surgical History:  Procedure Laterality Date  . ARM WOUND REPAIR / CLOSURE    . COLONOSCOPY    . TEE WITHOUT CARDIOVERSION N/A 12/17/2016   Procedure: TRANSESOPHAGEAL ECHOCARDIOGRAM (TEE);  Surgeon: Josue Hector, MD;  Location: [facility] ENDOSCOPY;  Service: Cardiovascular;   Laterality: N/A;  . TONSILLECTOMY      Social History   Social History  . Marital status: Widowed    Spouse name: N/A  . Number of children: N/A  . Years of education: N/A   Occupational History  . Not on file.   Social History Main Topics  . Smoking status: Current Every Day Smoker    Types: Cigarettes  . Smokeless tobacco: Never Used  . Alcohol use No  . Drug use: Yes    Types: Cocaine     Comment: last use 4 weeks ago  . Sexual activity: Yes    Birth control/ protection: None   Other Topics Concern  . Not on file   Social History Narrative  . No narrative on file    Allergies  Allergen Reactions  . Asa [Aspirin] Other (See Comments)    Ulcerative colitis flares.  . Nsaids Other (See Comments)    Ulcerative colitis flares  . Penicillins Rash and Other (See Comments)    Has patient had a PCN reaction causing immediate rash, facial/tongue/throat swelling, SOB or lightheadedness with hypotension:unsure Has patient had a PCN reaction causing severe rash involving mucus membranes or skin necrosis:unsure Has patient had a PCN reaction that required hospitalization:No Has patient had a PCN reaction occurring within the last 10 years:Yes If all of the above answers are "NO", then may proceed with Cephalosporin use.     History reviewed. No pertinent family history.  Prior to Admission medications  Not on File    Physical Exam: Vitals:   12/27/16 0429 12/27/16 0431  BP: (!) 137/97   Pulse: 100   Resp: 18   Temp: 98.4 F (36.9 C)   TempSrc: Oral   SpO2: 100%   Weight:  72.6 kg (160 lb)  Height:  5' 5"  (1.651 m)     Constitutional: NAD, Anxious, tearful Eyes: PERRL, lids and conjunctivae normal ENMT: Mucous membranes are moist. Posterior pharynx clear of any exudate or lesions.Normal dentition.  Neck: normal, supple, no masses, no thyromegaly Respiratory: clear to auscultation bilaterally, no wheezing, no crackles. Normal respiratory effort. No  accessory muscle use.  Cardiovascular: Regular rate and rhythm, no murmurs / rubs / gallops. No extremity edema. 2+ pedal pulses. No carotid bruits.  Abdomen: no tenderness, no masses palpated. No hepatosplenomegaly. Bowel sounds positive.  Musculoskeletal: no clubbing / cyanosis. No joint deformity upper and lower extremities. Good ROM, no contractures. Normal muscle tone.  Skin: no rashes, lesions, ulcers. No induration Neurologic: CN 2-12 grossly intact. Sensation intact, DTR normal. Strength 5/5 in all 4.  Psychiatric: Normal judgment and insight. Alert and oriented x 3. Tearful and anxious   Labs on Admission: I have personally reviewed following labs and imaging studies  CBC:  Recent Labs Lab 12/22/16 0428 12/23/16 0811  WBC 13.0* 7.8  NEUTROABS 9.0*  --   HGB 12.9 12.4  HCT 38.9 38.0  MCV 78.1 78.8  PLT 438* 024   Basic Metabolic Panel:  Recent Labs Lab 12/22/16 0428 12/23/16 0811  NA 130* 134*  K 4.6 4.6  CL 94* 102  CO2 24 25  GLUCOSE 122* 110*  BUN 11 16  CREATININE 0.53 0.55  CALCIUM 9.0 8.7*   GFR: Estimated Creatinine Clearance: 84.9 mL/min (by C-G formula based on SCr of 0.55 mg/dL). Liver Function Tests: No results for input(s): AST, ALT, ALKPHOS, BILITOT, PROT, ALBUMIN in the last 168 hours. No results for input(s): LIPASE, AMYLASE in the last 168 hours. No results for input(s): AMMONIA in the last 168 hours. Coagulation Profile: No results for input(s): INR, PROTIME in the last 168 hours. Cardiac Enzymes: No results for input(s): CKTOTAL, CKMB, CKMBINDEX, TROPONINI in the last 168 hours. BNP (last 3 results) No results for input(s): PROBNP in the last 8760 hours. HbA1C: No results for input(s): HGBA1C in the last 72 hours. CBG:  Recent Labs Lab 12/22/16 1716  GLUCAP 131*   Lipid Profile: No results for input(s): CHOL, HDL, LDLCALC, TRIG, CHOLHDL, LDLDIRECT in the last 72 hours. Thyroid Function Tests: No results for input(s): TSH,  T4TOTAL, FREET4, T3FREE, THYROIDAB in the last 72 hours. Anemia Panel: No results for input(s): VITAMINB12, FOLATE, FERRITIN, TIBC, IRON, RETICCTPCT in the last 72 hours. Urine analysis:    Component Value Date/Time   COLORURINE YELLOW 12/13/2016 0835   APPEARANCEUR HAZY (A) 12/13/2016 0835   LABSPEC 1.018 12/13/2016 0835   PHURINE 7.0 12/13/2016 0835   GLUCOSEU NEGATIVE 12/13/2016 0835   HGBUR NEGATIVE 12/13/2016 0835   BILIRUBINUR NEGATIVE 12/13/2016 0835   KETONESUR 5 (A) 12/13/2016 0835   PROTEINUR NEGATIVE 12/13/2016 0835   NITRITE NEGATIVE 12/13/2016 0835   LEUKOCYTESUR NEGATIVE 12/13/2016 0835    Creatinine Clearance: Estimated Creatinine Clearance: 84.9 mL/min (by C-G formula based on SCr of 0.55 mg/dL).  Sepsis Labs: @LABRCNTIP (procalcitonin:4,lacticidven:4) ) Recent Results (from the past 240 hour(s))  Culture, blood (routine x 2)     Status: None (Preliminary result)   Collection Time: 12/22/16  4:30 AM  Result Value Ref  Range Status   Specimen Description BLOOD RIGHT HAND  Final   Special Requests IN PEDIATRIC BOTTLE Blood Culture adequate volume  Final   Culture NO GROWTH 4 DAYS  Final   Report Status PENDING  Incomplete  Culture, blood (routine x 2)     Status: None (Preliminary result)   Collection Time: 12/22/16  4:45 AM  Result Value Ref Range Status   Specimen Description BLOOD RIGHT FOREARM  Final   Special Requests IN PEDIATRIC BOTTLE Blood Culture adequate volume  Final   Culture NO GROWTH 4 DAYS  Final   Report Status PENDING  Incomplete     Radiological Exams on Admission: No results found.  EKG: pending  Assessment/Plan Principal Problem:   MSSA (methicillin susceptible Staphylococcus aureus) infection Active Problems:   Cocaine abuse   IV drug user   Bacteremia   Polysubstance abuse   Osteomyelitis of left hip (HCC)   MSSA bacteremia and osteomyelitis of L hip: found during first admission. Originally admitted on 12/13/2016 with MSSA  bacteremia and sepsis, left AMA on 4/1, readmitted 4/2, left AMA again on 4/4. Plan is for 6 weeks of cefazolin per Dr. Megan Salon of ID. Will need SNF for IV ABX. Repeat Cx from 3/27 no growth. - continue Cefazolin, missed last 3 days - reconsult CSW for placement - Follow blood cultures drawn on 12/27/2016 at time of her readmission - reconsult ID if needed - PICC line DC'd, feel that she has demonstrated she cant really be trusted with this, will need to do Ancef via peripheral IV.  Back pan / L hip pain: Imaging from previous admission consistent w/ L hip septic joint associated w/ osteo and myositis.  - Continue ABX as above - PT/OT - consider reimaging if not improving - voltaren gel, lyrica, robaxin, tylenol.  Will defer decision on wether to resume oxycodone she had been on during prior 2 admits to the day team, but I am not writing for any right now as I told patient.  Polysubstance abuse: - Repeat UDS: although patient already admits that it will show Cocaine and THC again.  Tobacco abuse: - Continue nicotine patch  CBC and BMP pending for admission   DVT prophylaxis: Lovenox  Code Status: full  Family Communication: no family in room Disposition Plan: pending improvement in condition and placement at SNF for IV ABX Consults called: social work  Admission status: Admit to inpatient for IV antibiotics   [place], [place] [facility]  If 7PM-7AM, please contact night-coverage [email or web address] Password [facility] : [facility]  12/27/2016, 5:44 AM

## 2016-12-27 NOTE — ED Notes (Signed)
Admitting MD at bedside.

## 2016-12-27 NOTE — Evaluation (Signed)
Physical Therapy Evaluation Patient Details Name: Holly Weber MRN: [id number] DOB: [birth year] Today's Date: 12/27/2016   History of Present Illness  Patient is a 51 yo female admitted 12/27/16 with severe back pain and Lt hip pain.  Initial admit was 12/13/16, and patient left AMA x2.   PMH:  polysubstance abuse, depression, hepatitis, HTN, CVA  Clinical Impression  Patient presents with problems listed below.  Will benefit from acute PT to maximize functional mobility prior to discharge to next venue of care.  Recommend continued therapy at d/c at [facility].    Follow Up Recommendations SNF;Supervision for mobility/OOB    Equipment Recommendations  Rolling walker with 5" wheels;3in1 (PT)    Recommendations for Other Services       Precautions / Restrictions Precautions Precautions: Fall Precaution Comments: reliance on external support during mobility to manage pain Restrictions Weight Bearing Restrictions: No      Mobility  Bed Mobility Overal bed mobility: Modified Independent             General bed mobility comments: Reviewed log roll to move to sitting for decreased pain.  Patient with use of bed rail.  Transfers Overall transfer level: Needs assistance Equipment used: None Transfers: Sit to/from Stand Sit to Stand: Supervision         General transfer comment: Patient using bed rail and rail beside toilet to assist with moving to standing.  No physical assist needed.  Ambulation/Gait Ambulation/Gait assistance: Supervision Ambulation Distance (Feet): 30 Feet Assistive device: None Gait Pattern/deviations: Step-through pattern;Decreased stance time - left;Decreased step length - right;Decreased step length - left;Decreased stride length;Decreased weight shift to left;Antalgic Gait velocity: decreased Gait velocity interpretation: Below normal speed for age/gender General Gait Details: Patient using UE support on bed rail, sink, door, etc. for balance and to help  decrease pain.   Stairs            Wheelchair Mobility    Modified Rankin (Stroke Patients Only)       Balance           Standing balance support: Single extremity supported;During functional activity Standing balance-Leahy Scale: Poor                               Pertinent Vitals/Pain Pain Assessment: 0-10 Pain Score: 6  Pain Location: Lt hip and back Pain Descriptors / Indicators: Aching;Sharp;Sore Pain Intervention(s): Limited activity within patient's tolerance;Monitored during session;Repositioned;Heat applied    Home Living Family/patient expects to be discharged to:: Skilled nursing facility Living Arrangements: Non-relatives/Friends             Home Equipment: None Additional Comments: Patient to d/c to SNF for IV antibiotics per chart.    Prior Function Level of Independence: Independent         Comments: Does not drive     Hand Dominance   Dominant Hand: Right    Extremity/Trunk Assessment   Upper Extremity Assessment Upper Extremity Assessment: Overall WFL for tasks assessed    Lower Extremity Assessment Lower Extremity Assessment: Generalized weakness LLE Deficits / Details: Pain limiting strength.    Cervical / Trunk Assessment Cervical / Trunk Assessment: Normal  Communication   Communication: No difficulties  Cognition Arousal/Alertness: Awake/alert Behavior During Therapy: WFL for tasks assessed/performed;Anxious Overall Cognitive Status: Within Functional Limits for tasks assessed  General Comments      Exercises     Assessment/Plan    PT Assessment Patient needs continued PT services  PT Problem List Decreased strength;Decreased activity tolerance;Decreased balance;Decreased mobility;Decreased knowledge of use of DME;Pain       PT Treatment Interventions DME instruction;Gait training;Functional mobility training;Therapeutic  activities;Patient/family education    PT Goals (Current goals can be found in the Care Plan section)  Acute Rehab PT Goals Patient Stated Goal: Decrease pain PT Goal Formulation: With patient Time For Goal Achievement: 01/03/17 Potential to Achieve Goals: Good    Frequency Min 2X/week   Barriers to discharge Decreased caregiver support      Co-evaluation               End of Session Equipment Utilized During Treatment: Gait belt Activity Tolerance: Patient limited by pain Patient left: in bed;with call bell/phone within reach Nurse Communication: Mobility status PT Visit Diagnosis: Other abnormalities of gait and mobility (R26.89);Pain Pain - Right/Left: Left Pain - part of body: Hip    Time: 5597-4163 PT Time Calculation (min) (ACUTE ONLY): 24 min   Charges:   PT Evaluation $PT Eval Moderate Complexity: 1 Procedure PT Treatments $Gait Training: 8-22 mins   PT G Codes:        Carita Pian. Sanjuana Kava, [facility] Acute Rehab Services Pager [place] 12/27/2016, 7:09 PM

## 2016-12-27 NOTE — ED Notes (Signed)
Alcario Drought MD wants PICC line removed now. IV team consult placed.

## 2016-12-28 DIAGNOSIS — F1410 Cocaine abuse, uncomplicated: Secondary | ICD-10-CM

## 2016-12-28 DIAGNOSIS — R7881 Bacteremia: Secondary | ICD-10-CM

## 2016-12-28 DIAGNOSIS — A4901 Methicillin susceptible Staphylococcus aureus infection, unspecified site: Secondary | ICD-10-CM

## 2016-12-28 MED ORDER — LIDOCAINE 5 % EX PTCH
1.0000 | MEDICATED_PATCH | CUTANEOUS | Status: DC
  Administered 2016-12-28: 1 via TRANSDERMAL
  Filled 2016-12-28: qty 1

## 2016-12-28 MED ORDER — OXYCODONE HCL 5 MG PO TABS
5.0000 mg | ORAL_TABLET | ORAL | Status: DC | PRN
  Administered 2016-12-28 – 2016-12-30 (×9): 5 mg via ORAL
  Filled 2016-12-28 (×9): qty 1

## 2016-12-28 MED ORDER — METHOCARBAMOL 500 MG PO TABS
500.0000 mg | ORAL_TABLET | Freq: Three times a day (TID) | ORAL | Status: DC
  Administered 2016-12-28 – 2016-12-30 (×7): 500 mg via ORAL
  Filled 2016-12-28 (×7): qty 1

## 2016-12-28 MED ORDER — WHITE PETROLATUM GEL
Status: AC
  Administered 2016-12-28: 15:00:00
  Filled 2016-12-28: qty 1

## 2016-12-28 NOTE — Progress Notes (Signed)
[facility] PROGRESS NOTE  MALEEAH CROSSMAN EUM:[id number] DOB: January 15, 1966 DOA: 12/27/2016   PCP: No PCP Per Patient     Assessment/Plan: Principal Problem:   MSSA (methicillin susceptible Staphylococcus aureus) infection Active Problems:   Cocaine abuse   IV drug user   Bacteremia   Polysubstance abuse   Osteomyelitis of left hip ([place])   51 y.o.femalewith medical history significant of ulcerative colitis, depression, hepatitis, hypertension, CVA, hypothyroidism presenting with persistent left hip and back pain,. She was originally admitted on 12/13/2016 and found to have MSSA bacteremia with left hip osteomyelitis and myositis.She was put on Ancef with resolution of sepsis, further BCx during that admit and last admit were negative. Patient first left the hospital AMA on 4/1 she was then readmitted 4/2 with positive UDS for cocaine and THC at that time and report of a syringe with clear substance confiscated from patient in ED. She had a PICC line placed on 4/3, and then left a second time AMA on 4/4. She is back on 4/7 and reports taking cocaine and THC and her UDS is positive for both.  She is put back on the ancef and PICC line removed   Assessment and plan  # Sepsis due to MSSA bacteremia:  - TEE 3/ 28 with no vegetation.  -Planned for 6 weeks of IV cefazolin as per Dr. Megan Salon. Patient needs skilled nursing facility for IV antibiotics. Social worker is following. Patient is difficult to place at [facility]. -follow up repeat cultures from 3/27, no growth to date. Patient was supposed to continue antibiotics through 01/20/17 She had a PICC line placed on 4/3, now remove Follow-up blood cultures from 4/7. If They remain negative patient's PICC line will be replaced Patient will need SNF at discharge  # Back pain with left sided sciatica:  -CT/MRI if spine showed no acute findings. MRI of left hip obtained because of severe pain. The MRI consistent with left hip septic  joint associated with osteomyelitis and myositis. Currently on IV antibiotics. No surgical intervention as per orthopedics during previous admissions.  # Pain management/drug seeking behavior: The patient looks mostly comfortable but when we start talking about her symptoms she starts crying and asking for IV Dilaudid.  minimize IV pain medications.Continue  Robaxin, and added oxycodone and Lidoderm patch  Continue PT OT evaluation and treatment.  # Polysubstance abuse: Urine toxicology positive for cocaine and THC. Patient was educated at bedside today. We will rescreen urine toxicology today. Discussed with the patient hours.  #Hypokalemia, hyponatremia: Electrolyte repleted. Potassium improved. Monitor labs. Encourage oral intake.  #  Hypertension: monitor BP.  # Tobacco abuse: continue nicotine patch.     # Hyperglycemia - place on diabetic diet    # Hyponatremia, improved     DVT prophylaxsis Lovenox  Code Status:  Full code    Family Communication: Discussed in detail with the patient, all imaging results, lab results explained to the patient   Disposition Plan:  Will need SNF if patient is being discharged with PICC line       Consultants:     Procedures:     Antibiotics: Anti-infectives    Start     Dose/Rate Route Frequency Ordered Stop   12/27/16 0600  ceFAZolin (ANCEF) IVPB 2g/100 mL premix     2 g 200 mL/hr over 30 Minutes Intravenous Every 8 hours 12/27/16 0508           HPI/Subjective:  Complaining of excruciating pain  in her left hip, went up with therapy  Objective: Vitals:   12/27/16 2041 12/27/16 2236 12/28/16 0551 12/28/16 0846  BP: 131/76  121/70 (!) 102/52  Pulse: 85  91 88  Resp: 14  16 17   Temp: 98.2 F (36.8 C)  98 F (36.7 C) 98 F (36.7 C)  TempSrc: Oral  Oral Oral  SpO2: 97%  96% 97%  Weight:  69.9 kg (154 lb)    Height:        Intake/Output Summary (Last 24 hours) at 12/28/16 0939 Last data filed at 12/28/16  0603  Gross per 24 hour  Intake             1220 ml  Output             1000 ml  Net              220 ml    Exam:  Examination:  General exam: Appears calm and comfortable  Respiratory system: Clear to auscultation. Respiratory effort normal. Cardiovascular system: S1 & S2 heard, RRR. No JVD, murmurs, rubs, gallops or clicks. No pedal edema. Gastrointestinal system: Abdomen is nondistended, soft and nontender. No organomegaly or masses felt. Normal bowel sounds heard. Central nervous system: Alert and oriented. No focal neurological deficits. Extremities: Symmetric 5 x 5 power. Skin: No rashes, lesions or ulcers Psychiatry: Judgement and insight appear normal. Mood & affect appropriate.     Data Reviewed: I have personally reviewed following labs and imaging studies  Micro Results Recent Results (from the past 240 hour(s))  Culture, blood (routine x 2)     Status: None   Collection Time: 12/22/16  4:30 AM  Result Value Ref Range Status   Specimen Description BLOOD RIGHT HAND  Final   Special Requests IN PEDIATRIC BOTTLE Blood Culture adequate volume  Final   Culture NO GROWTH 5 DAYS  Final   Report Status 12/27/2016 FINAL  Final  Culture, blood (routine x 2)     Status: None   Collection Time: 12/22/16  4:45 AM  Result Value Ref Range Status   Specimen Description BLOOD RIGHT FOREARM  Final   Special Requests IN PEDIATRIC BOTTLE Blood Culture adequate volume  Final   Culture NO GROWTH 5 DAYS  Final   Report Status 12/27/2016 FINAL  Final    Radiology Reports Ct Lumbar Spine Wo Contrast  Result Date: 12/13/2016 CLINICAL DATA:  Lumbago with left-sided radicular symptoms EXAM: CT LUMBAR SPINE WITHOUT CONTRAST TECHNIQUE: Multidetector CT imaging of the lumbar spine was performed without intravenous contrast administration. Multiplanar CT image reconstructions were also generated. COMPARISON:  None. FINDINGS: Segmentation: There are 5 non-rib-bearing lumbar type vertebral  bodies. 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 There is a well-defined S1-S2 interspace. Alignment: There is no spondylolisthesis. Vertebrae: No fracture.  No blastic or lytic bone lesions. Paraspinal and other soft tissues: No paraspinous lesions are evident. There are foci of calcification in the abdominal aorta and common iliac artery without demonstrable abdominal aortic aneurysm. Disc levels: At T12-L1, there is no nerve root edema or effacement. No disc extrusion or stenosis. At L1-2, there is mild facet hypertrophy bilaterally. There is slight diffuse disc bulging. No nerve root edema or effacement. No disc extrusion or stenosis. At L2-3, there is mild facet hypertrophy bilaterally. There is a moderate diffuse disc bulging without nerve root edema or effacement. No disc extrusion or stenosis. At L3-4, there is moderate facet hypertrophy bilaterally. There is mild diffuse disc  bulging without nerve root edema or effacement. No disc extrusion or stenosis. At L4-5, there is moderate facet hypertrophy bilaterally. There is broad-based disc bulging. No disc extrusion. There is, however, mild to moderate generalized spinal stenosis at L4-5 due to generalized disc bulging coupled with facet and ligamentum flava hypertrophy bilaterally. At L5-S1, there is moderate facet hypertrophy bilaterally. There is broad-based disc protrusion without nerve root edema or effacement. No frank disc extrusion or stenosis. At S1-2, there is moderate facet hypertrophy bilaterally. No nerve root edema or effacement. No disc extrusion or stenosis. There is osteoarthritic change in the assimilation joint on the right at S1-2. There is mild osteoarthritic change in the sacroiliac joints bilaterally. No sacroiliitis evident. 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 Mild-to-moderate spinal stenosis at L4-5 due to disc  protrusion coupled with moderate generalized facet and ligamentum flava hypertrophy. No disc extrusion on this study. Facet hypertrophy at multiple levels. No fracture or spondylolisthesis. 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 Aortoiliac atherosclerosis without demonstrable aneurysm. Electronically Signed   By: Lowella Grip III M.D.   On: 12/13/2016 09:26   Mr Lumbar Spine W Wo Contrast  Result Date: 12/13/2016 CLINICAL DATA:  Mid to low back pain radiating to LEFT hip. Progressive worsening. EXAM: MRI LUMBAR SPINE WITHOUT AND WITH CONTRAST TECHNIQUE: Multiplanar and multiecho pulse sequences of the lumbar spine were obtained without and with intravenous contrast. CONTRAST:  3m MULTIHANCE GADOBENATE DIMEGLUMINE 529 MG/ML IV SOLN COMPARISON:  CT lumbar earlier today. FINDINGS: Segmentation: Transitional anatomy. S1-S2 is well-developed due to partial assimilation of S1 into the sacrum. Alignment:  Anatomic Vertebrae: No worrisome osseous lesion. Congenitally short pedicles. No abnormal postcontrast enhancement. Conus medullaris: Extends to the L1 level and appears normal. Paraspinal and other soft tissues: Negative. Disc levels: L1-L2:  Unremarkable. L2-L3:  Annular bulge.  No impingement. L3-L4:  And bulge.  No impingement. L4-L5: Unremarkable disc space. Posterior element hypertrophy. Short pedicles. Mild stenosis without definite impingement. L5-S1: Unremarkable disc space. Posterior element hypertrophy. Short pedicles. Mild stenosis without definite impingement. S1-S2 transitional interspace.  No impingement. IMPRESSION: No disc pathology of significance. 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 No definite subarticular zone or foraminal zone narrowing is established. Electronically Signed   By: JStaci RighterM.D.   On: 12/13/2016 13:00   Mr Hip Left W Wo  Contrast  Result Date: 12/18/2016 CLINICAL DATA:  Patient admitted to the hospital 12/13/2016 with bacteremia. Severe left hip pain. No known injury. History of IV drug abuse. EXAM: MRI OF THE LEFT HIP WITHOUT AND WITH CONTRAST TECHNIQUE: Multiplanar, multisequence MR imaging was performed both before and after administration of intravenous contrast. CONTRAST:  15 cc MultiHance IV. COMPARISON:  Plain films left hip 12/13/2016. FINDINGS: Bones: There is marrow edema and enhancement about the left sacroiliac joint most consistent with osteomyelitis. Marrow signal is diffusely heterogeneous as can be seen in cigarette smoking or obesity. There is no avascular necrosis of the femoral heads. No fracture. Articular cartilage and labrum Articular cartilage:  Normal. Labrum:  Intact. Joint or bursal effusion Joint effusion:  Small left SI joint effusion is seen. Bursae:  Negative. Muscles and tendons Muscles and tendons: Intact. There is some edema and enhancement in musculature about the left SI joint including the iliacus and gluteus medius. No intramuscular fluid collection is identified although  visualization is somewhat limited on this hip MRI. Other findings Miscellaneous: Imaged intrapelvic contents demonstrate no acute abnormality. IMPRESSION: Findings most consistent with a septic SI joint on the left with surrounding osteomyelitis and myositis. No abscess is identified. Electronically Signed   By: Inge Rise M.D.   On: 12/18/2016 08:22   Dg Chest Port 1 View  Result Date: 12/13/2016 CLINICAL DATA:  Sepsis. EXAM: PORTABLE CHEST 1 VIEW COMPARISON:  07/20/2014 FINDINGS: The heart size is normal. There are patchy densities in the lung bases bilaterally suspicious for infiltrates. No pulmonary edema. IMPRESSION: Suspect bilateral lower lobe infiltrates. Electronically Signed   By: Nolon Nations M.D.   On: 12/13/2016 22:58   Dg Hip Unilat W Or Wo Pelvis 2-3 Views Left  Result Date: 12/13/2016 CLINICAL  DATA:  Mid and lower back pain radiating to the left hip. EXAM: DG HIP (WITH OR WITHOUT PELVIS) 2-3V LEFT COMPARISON:  None. FINDINGS: Pelvic bony ring is intact. No gross abnormality to the right hip. Left hip is located without a fracture. No significant joint space narrowing in either hip. Gas and stool in the pelvis. IMPRESSION: No acute abnormality in the pelvis or left hip. Electronically Signed   By: Markus Daft M.D.   On: 12/13/2016 09:12     CBC  Recent Labs Lab 12/22/16 0428 12/23/16 0811 12/27/16 0500  WBC 13.0* 7.8 9.7  HGB 12.9 12.4 12.3  HCT 38.9 38.0 38.3  PLT 438* 390 600*  MCV 78.1 78.8 79.3  MCH 25.9* 25.7* 25.5*  MCHC 33.2 32.6 32.1  RDW 14.1 14.4 13.8  LYMPHSABS 3.0  --   --   MONOABS 0.8  --   --   EOSABS 0.2  --   --   BASOSABS 0.1  --   --     Chemistries   Recent Labs Lab 12/22/16 0428 12/23/16 0811 12/27/16 0500  NA 130* 134* 135  K 4.6 4.6 3.8  CL 94* 102 98*  CO2 24 25 24   GLUCOSE 122* 110* 192*  BUN 11 16 12   CREATININE 0.53 0.55 0.68  CALCIUM 9.0 8.7* 9.2   ------------------------------------------------------------------------------------------------------------------ estimated creatinine clearance is 83.5 mL/min (by C-G formula based on SCr of 0.68 mg/dL). ------------------------------------------------------------------------------------------------------------------ No results for input(s): HGBA1C in the last 72 hours. ------------------------------------------------------------------------------------------------------------------ No results for input(s): CHOL, HDL, LDLCALC, TRIG, CHOLHDL, LDLDIRECT in the last 72 hours. ------------------------------------------------------------------------------------------------------------------ No results for input(s): TSH, T4TOTAL, T3FREE, THYROIDAB in the last 72 hours.  Invalid input(s):  FREET3 ------------------------------------------------------------------------------------------------------------------ No results for input(s): VITAMINB12, FOLATE, FERRITIN, TIBC, IRON, RETICCTPCT in the last 72 hours.  Coagulation profile No results for input(s): INR, PROTIME in the last 168 hours.  No results for input(s): DDIMER in the last 72 hours.  Cardiac Enzymes No results for input(s): CKMB, TROPONINI, MYOGLOBIN in the last 168 hours.  Invalid input(s): CK ------------------------------------------------------------------------------------------------------------------ Invalid input(s): POCBNP   CBG:  Recent Labs Lab 12/22/16 1716  GLUCAP 131*       Studies: No results found.    Lab Results  Component Value Date   HGBA1C 5.8 (H) 12/13/2016   Lab Results  Component Value Date   CREATININE 0.68 12/27/2016       Scheduled Meds: .  ceFAZolin (ANCEF) IV  2 g Intravenous Q8H  . diclofenac sodium  4 g Topical QID  . enoxaparin (LOVENOX) injection  40 mg Subcutaneous Q24H  . methocarbamol  1,000 mg Oral QID  . nicotine  21 mg Transdermal Daily  . nystatin   Topical  TID  . pneumococcal 23 valent vaccine  0.5 mL Intramuscular Tomorrow-1000  . pregabalin  75 mg Oral BID   Continuous Infusions:   LOS: 1 day    Time spent: >30 MINS    Reyne Dumas  [facility] Pager [phone]. If 7PM-7AM, please contact night-coverage at [email or web address], password [facility] 12/28/2016, 9:39 AM  LOS: 1 day

## 2016-12-28 NOTE — Evaluation (Signed)
Occupational Therapy Evaluation Patient Details Name: Holly Weber MRN: [id number] DOB: [birth year] Today's Date: 12/28/2016    History of Present Illness Patient is a 51 yo female admitted 12/27/16 with severe back pain and Lt hip pain.  Initial admit was 12/13/16, and patient left AMA x2.   PMH:  polysubstance abuse, depression, hepatitis, HTN, CVA   Clinical Impression   Pt reports she was independent with ADL PTA. Currently pt overall min guard for ADL and functional mobility due to pain in L hip/buttocks area. Pt planning d/c to SNF for administration of IV antibiotics. Pt would benefit from continued skilled to OT address established goals.    Follow Up Recommendations  SNF;Supervision/Assistance - 24 hour    Equipment Recommendations  None recommended by OT    Recommendations for Other Services       Precautions / Restrictions Precautions Precautions: Fall Restrictions Weight Bearing Restrictions: No      Mobility Bed Mobility Overal bed mobility: Modified Independent Bed Mobility: Supine to Sit;Sit to Supine              Transfers Overall transfer level: Needs assistance Equipment used: None Transfers: Sit to/from Stand Sit to Stand: Min guard         General transfer comment: Due to pain    Balance Overall balance assessment: Needs assistance Sitting-balance support: Feet supported;No upper extremity supported Sitting balance-Leahy Scale: Good     Standing balance support: No upper extremity supported;During functional activity Standing balance-Leahy Scale: Fair                             ADL either performed or assessed with clinical judgement   ADL Overall ADL's : Needs assistance/impaired Eating/Feeding: Independent;Sitting   Grooming: Min guard;Standing;Wash/dry hands   Upper Body Bathing: Set up;Supervision/ safety;Sitting   Lower Body Bathing: Min guard;Sit to/from stand   Upper Body Dressing : Set  up;Supervision/safety;Sitting   Lower Body Dressing: Min guard;Sit to/from stand   Toilet Transfer: Min guard;Ambulation;Regular Toilet;Grab bars   Toileting- Clothing Manipulation and Hygiene: Min guard;Sit to/from stand       Functional mobility during ADLs: Min guard General ADL Comments: Pt limited and requires min guard due to pain     Vision         Perception     Praxis      Pertinent Vitals/Pain Pain Assessment: Faces Faces Pain Scale: Hurts whole lot Pain Location: L hip and buttocks Pain Descriptors / Indicators: Aching;Grimacing;Guarding Pain Intervention(s): Monitored during session;Patient requesting pain meds-RN notified     Hand Dominance Right   Extremity/Trunk Assessment Upper Extremity Assessment Upper Extremity Assessment: Overall WFL for tasks assessed   Lower Extremity Assessment Lower Extremity Assessment: Defer to PT evaluation   Cervical / Trunk Assessment Cervical / Trunk Assessment: Normal   Communication Communication Communication: No difficulties   Cognition Arousal/Alertness: Awake/alert Behavior During Therapy: WFL for tasks assessed/performed;Anxious Overall Cognitive Status: Within Functional Limits for tasks assessed                                     General Comments       Exercises     Shoulder Instructions      Home Living Family/patient expects to be discharged to:: Skilled nursing facility  Home Equipment: None   Additional Comments: Patient to d/c to SNF for IV antibiotics per chart.      Prior Functioning/Environment Level of Independence: Independent        Comments: Does not drive        OT Problem List: Decreased strength;Impaired balance (sitting and/or standing);Decreased safety awareness;Pain      OT Treatment/Interventions: Self-care/ADL training;Therapeutic activities;Patient/family education;Balance training    OT Goals(Current  goals can be found in the care plan section) Acute Rehab OT Goals Patient Stated Goal: decrease pain, get better OT Goal Formulation: With patient Time For Goal Achievement: 01/11/17 Potential to Achieve Goals: Good ADL Goals Pt Will Perform Lower Body Bathing: with modified independence;sit to/from stand Pt Will Perform Lower Body Dressing: with modified independence;sit to/from stand Pt Will Transfer to Toilet: with modified independence;ambulating;regular height toilet Pt Will Perform Toileting - Clothing Manipulation and hygiene: with modified independence;sit to/from stand Pt Will Perform Tub/Shower Transfer: Tub transfer;with modified independence;ambulating  OT Frequency: Min 2X/week   Barriers to D/C:            Co-evaluation              End of Session Nurse Communication: Mobility status;Patient requests pain meds  Activity Tolerance: Patient tolerated treatment well Patient left: in bed;with call bell/phone within reach  OT Visit Diagnosis: Unsteadiness on feet (R26.81);Other abnormalities of gait and mobility (R26.89);Pain Pain - Right/Left: Left Pain - part of body: Hip                Time: 6979-4801 OT Time Calculation (min): 21 min Charges:  OT General Charges $OT Visit: 1 Procedure OT Evaluation $OT Eval Moderate Complexity: 1 Procedure G-Codes:     Jonavin Seder A. Ulice Brilliant, M.S., OTR/L Pager: [place] 12/28/2016, 1:11 PM

## 2016-12-29 MED ORDER — ZOLPIDEM TARTRATE 5 MG PO TABS
5.0000 mg | ORAL_TABLET | Freq: Every evening | ORAL | Status: DC | PRN
  Administered 2016-12-29: 5 mg via ORAL
  Filled 2016-12-29: qty 1

## 2016-12-29 MED ORDER — LIDOCAINE 5 % EX PTCH: 2.0000 | MEDICATED_PATCH | CUTANEOUS | Status: DC

## 2016-12-29 MED ORDER — CEPHALEXIN 500 MG PO CAPS
500.0000 mg | ORAL_CAPSULE | Freq: Four times a day (QID) | ORAL | Status: DC
  Administered 2016-12-29 – 2016-12-30 (×3): 500 mg via ORAL
  Filled 2016-12-29 (×3): qty 1

## 2016-12-29 MED ORDER — LIDOCAINE 5 % EX PTCH
2.0000 | MEDICATED_PATCH | CUTANEOUS | Status: DC
  Administered 2016-12-29 – 2016-12-30 (×2): 2 via TRANSDERMAL
  Filled 2016-12-29 (×3): qty 2

## 2016-12-29 MED ORDER — ORITAVANCIN DIPHOSPHATE 400 MG IV SOLR
1200.0000 mg | Freq: Once | INTRAVENOUS | Status: AC
  Administered 2016-12-29: 1200 mg via INTRAVENOUS
  Filled 2016-12-29: qty 120

## 2016-12-29 NOTE — Progress Notes (Signed)
Pharmacy Antibiotic Note  Holly Weber is a 51 y.o. female admitted on 12/27/2016 with known MSSA bacteremia/osteomyelitis. The patient was originally admitted late March and seen by ID who completed a work-up and recommended 6 weeks of Cefazolin with a stop date of 01/25/17.   The patient has left AMA both on 4/1 (PICC removed prior to leaving AMA) and 4/4 (with PICC in place). UDS was positive on both readmissions. PICC now removed on this admit. The patient has history of IVDA and placing another PICC even with SNF supervision is risky given the patient's flight risk. This was discussed with ID Johnnye Sima) today and the decision was made to give a dose of Oritivancin and start oral Keflex.   Plan: 1. Oritavancin 1200 mg IV x 1 2. Start Keflex 500 mg 4x/day thru 5/6 3. Pharmacy will monitor peripherally - no active consults currently in place.   Temp (24hrs), Avg:98.1 F (36.7 C), Min:97.7 F (36.5 C), Max:98.6 F (37 C)   Recent Labs Lab 12/23/16 0811 12/27/16 0500  WBC 7.8 9.7  CREATININE 0.55 0.68    Estimated Creatinine Clearance: 86.1 mL/min (by C-G formula based on SCr of 0.68 mg/dL).    Thank you for allowing pharmacy to be a part of this patient's care.  Holly Weber, PharmD, BCPS Clinical Pharmacist Pager: [phone] Clinical phone for 12/29/2016 from 7a-3:30p: [phone] If after 3:30p, please call main pharmacy at: [phone] 12/29/2016 11:03 AM

## 2016-12-29 NOTE — Progress Notes (Signed)
qPhysical Therapy Treatment Patient Details Name: Holly Weber MRN: [id number] DOB: 11-22-1965 Today's Date: 12/29/2016    History of Present Illness Patient is a 51 yo female admitted 12/27/16 with severe back pain and Lt hip pain.  Initial admit was 12/13/16, and patient left AMA x2.   PMH:  polysubstance abuse, depression, hepatitis, HTN, CVA    PT Comments    Pt performed increased activity and mobility.  Pt remains to c/o pain which limits her ability to correct gait deviations.  Pt presents with wide BOS and waddling pattern secondary to pain.      Follow Up Recommendations  SNF;Supervision for mobility/OOB     Equipment Recommendations  Rolling walker with 5" wheels;3in1 (PT)    Recommendations for Other Services OT consult     Precautions / Restrictions Precautions Precautions: Fall Precaution Comments: reliance on external support during mobility to manage pain Restrictions Weight Bearing Restrictions: No    Mobility  Bed Mobility Overal bed mobility: Modified Independent Bed Mobility: Supine to Sit;Sit to Supine;Rolling Rolling: Modified independent (Device/Increase time)   Supine to sit: Modified independent (Device/Increase time);HOB elevated Sit to supine: Modified independent (Device/Increase time);HOB elevated   General bed mobility comments: Reviewed log roll to move to sitting for decreased pain.  Patient with use of bed rail.  Transfers Overall transfer level: Modified independent Equipment used: None Transfers: Sit to/from Stand Sit to Stand: Modified independent (Device/Increase time)         General transfer comment: Good technique observed despite pain.    Ambulation/Gait Ambulation/Gait assistance: Supervision Ambulation Distance (Feet): 180 Feet Assistive device: None Gait Pattern/deviations: Decreased stride length;Shuffle;Wide base of support;Antalgic Gait velocity: decreased Gait velocity interpretation: Below normal speed for  age/gender General Gait Details: Cues for reciprocal armswing and B knee flexion during swing phase to decrease waddling gait pattern.     Stairs            Wheelchair Mobility    Modified Rankin (Stroke Patients Only)       Balance Overall balance assessment: Needs assistance Sitting-balance support: Feet supported;No upper extremity supported Sitting balance-Leahy Scale: Good       Standing balance-Leahy Scale: Fair                              Cognition Arousal/Alertness: Awake/alert Behavior During Therapy: WFL for tasks assessed/performed Overall Cognitive Status: Within Functional Limits for tasks assessed                                        Exercises      General Comments        Pertinent Vitals/Pain Pain Assessment: 0-10 Pain Score: 5  Pain Location: L hip and buttocks, radiating into knee Pain Descriptors / Indicators: Aching;Grimacing;Guarding Pain Intervention(s): Repositioned;Monitored during session    Home Living                      Prior Function            PT Goals (current goals can now be found in the care plan section) Acute Rehab PT Goals Patient Stated Goal: decrease pain, get better Potential to Achieve Goals: Good Progress towards PT goals: Progressing toward goals    Frequency    Min 2X/week      PT Plan Current plan remains appropriate  Co-evaluation             End of Session Equipment Utilized During Treatment: Gait belt Activity Tolerance: Patient tolerated treatment well Patient left: in bed;with call bell/phone within reach Nurse Communication: Mobility status PT Visit Diagnosis: Other abnormalities of gait and mobility (R26.89);Pain Pain - Right/Left: Left Pain - part of body: Hip     Time: 6712-4580 PT Time Calculation (min) (ACUTE ONLY): 12 min  Charges:  $Gait Training: 8-22 mins                    G Codes:       Governor Rooks, PTA pager  [phone]    Cristela Blue 12/29/2016, 4:34 PM

## 2016-12-29 NOTE — Progress Notes (Addendum)
[facility] PROGRESS NOTE  Holly Weber QAS:[id number] DOB: September 06, 1966 DOA: 12/27/2016   PCP: No PCP Per Patient     Assessment/Plan: Principal Problem:   MSSA (methicillin susceptible Staphylococcus aureus) infection Active Problems:   Cocaine abuse   IV drug user   Bacteremia   Polysubstance abuse   Osteomyelitis of left hip ([place])   51 y.o.femalewith medical history significant of ulcerative colitis, depression, hepatitis, hypertension, CVA, hypothyroidism presenting with persistent left hip and back pain,. She was originally admitted on 12/13/2016 and found to have MSSA bacteremia with left hip osteomyelitis and myositis.She was put on Ancef with resolution of sepsis, further BCx during that admit and last admit were negative. Patient first left the hospital AMA on 4/1 she was then readmitted 4/2 with positive UDS for cocaine and THC at that time and report of a syringe with clear substance confiscated from patient in ED. She had a PICC line placed on 4/3, and then left a second time AMA on 4/4. She is back on 4/7 and reports taking cocaine and THC and her UDS is positive for both.  She is put back on the ancef and PICC line removed   Assessment and plan  # Sepsis due to MSSA bacteremia:  - TEE 3/ 28 with no vegetation.  -Planned for 6 weeks of IV cefazolin as per Dr. Megan Salon. Patient needs skilled nursing facility for IV antibiotics. Social worker  consulted. Patient is difficult to place at [facility]. -follow up repeat cultures from 4/7 Patient was supposed to continue antibiotics through 01/20/17, based on previous ID notes. She states that she missed a total of 3 days of treatment She had a PICC line placed on 4/3, now removed Discussed with ID-she will be given  orbactiv and be switched to keflex as she is not compliant     # Back pain with left sided sciatica:  -CT/MRI if spine showed no acute findings. MRI of left hip obtained because of severe pain. The MRI  consistent with left hip septic joint associated with osteomyelitis and myositis. Currently on IV antibiotics. No surgical intervention as per orthopedics during previous admissions.   # Pain management/drug seeking behavior: The patient looks mostly comfortable but when we start talking about her symptoms she starts crying and asking for IV Dilaudid.  minimize IV pain medications.Continue  Robaxin, and added oxycodone and Lidoderm patch  Continue PT OT evaluation and treatment.  # Polysubstance abuse: Urine toxicology positive for cocaine and THC. Patient was educated at bedside today. We will rescreen urine toxicology today. Discussed with the patient hours.  #Hypokalemia, hyponatremia: Electrolyte repleted. Potassium improved. Monitor labs. Encourage oral intake.  #  Hypertension: monitor BP.  # Tobacco abuse: continue nicotine patch.     # Hyperglycemia - place on diabetic diet    # Hyponatremia, improved   # Thrombocytopenia-likely reactive, will repeat  DVT prophylaxsis Lovenox  Code Status:  Full code    Family Communication: Discussed in detail with the patient, all imaging results, lab results explained to the patient   Disposition Plan:dc home in am       Consultants:  ID -dr hatcher via phone    Procedures:     Antibiotics: Anti-infectives    Start     Dose/Rate Route Frequency Ordered Stop   12/27/16 0600  ceFAZolin (ANCEF) IVPB 2g/100 mL premix     2 g 200 mL/hr over 30 Minutes Intravenous Every 8 hours 12/27/16 0508  HPI/Subjective:  still Complaining of excruciating pain in her left hip, went up with therapy  Objective: Vitals:   12/28/16 1639 12/28/16 2048 12/28/16 2254 12/29/16 0420  BP: (!) 147/78 (!) 151/74  (!) 149/74  Pulse: 95 (!) 103  87  Resp: 18 16  16   Temp: 98.2 F (36.8 C) 98.6 F (37 C)  97.8 F (36.6 C)  TempSrc: Oral     SpO2: 99% 96%  95%  Weight:   74.8 kg (165 lb)   Height:        Intake/Output  Summary (Last 24 hours) at 12/29/16 0350 Last data filed at 12/29/16 0600  Gross per 24 hour  Intake             1380 ml  Output                0 ml  Net             1380 ml    Exam:  Examination:  General exam: Appears calm and comfortable  Respiratory system: Clear to auscultation. Respiratory effort normal. Cardiovascular system: S1 & S2 heard, RRR. No JVD, murmurs, rubs, gallops or clicks. No pedal edema. Gastrointestinal system: Abdomen is nondistended, soft and nontender. No organomegaly or masses felt. Normal bowel sounds heard. Central nervous system: Alert and oriented. No focal neurological deficits. Extremities: Symmetric 5 x 5 power. Skin: No rashes, lesions or ulcers Psychiatry: Judgement and insight appear normal. Mood & affect appropriate.     Data Reviewed: I have personally reviewed following labs and imaging studies  Micro Results Recent Results (from the past 240 hour(s))  Culture, blood (routine x 2)     Status: None   Collection Time: 12/22/16  4:30 AM  Result Value Ref Range Status   Specimen Description BLOOD RIGHT HAND  Final   Special Requests IN PEDIATRIC BOTTLE Blood Culture adequate volume  Final   Culture NO GROWTH 5 DAYS  Final   Report Status 12/27/2016 FINAL  Final  Culture, blood (routine x 2)     Status: None   Collection Time: 12/22/16  4:45 AM  Result Value Ref Range Status   Specimen Description BLOOD RIGHT FOREARM  Final   Special Requests IN PEDIATRIC BOTTLE Blood Culture adequate volume  Final   Culture NO GROWTH 5 DAYS  Final   Report Status 12/27/2016 FINAL  Final  Blood culture (routine x 2)     Status: None (Preliminary result)   Collection Time: 12/27/16  4:50 AM  Result Value Ref Range Status   Specimen Description BLOOD PICC LINE  Final   Special Requests   Final    IN PEDIATRIC BOTTLE Blood Culture results may not be optimal due to an excessive volume of blood received in culture bottles   Culture NO GROWTH 1 DAY  Final    Report Status PENDING  Incomplete  Blood culture (routine x 2)     Status: None (Preliminary result)   Collection Time: 12/27/16  5:51 AM  Result Value Ref Range Status   Specimen Description BLOOD RIGHT HAND  Final   Special Requests   Final    BOTTLES DRAWN AEROBIC AND ANAEROBIC Blood Culture adequate volume   Culture NO GROWTH 1 DAY  Final   Report Status PENDING  Incomplete    Radiology Reports Ct Lumbar Spine Wo Contrast  Result Date: 12/13/2016 CLINICAL DATA:  Lumbago with left-sided radicular symptoms EXAM: CT LUMBAR SPINE WITHOUT CONTRAST TECHNIQUE: Multidetector CT imaging of the  lumbar spine was performed without intravenous contrast administration. Multiplanar CT image reconstructions were also generated. COMPARISON:  None. FINDINGS: Segmentation: There are 5 non-rib-bearing lumbar type vertebral bodies. 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 There is a well-defined S1-S2 interspace. Alignment: There is no spondylolisthesis. Vertebrae: No fracture.  No blastic or lytic bone lesions. Paraspinal and other soft tissues: No paraspinous lesions are evident. There are foci of calcification in the abdominal aorta and common iliac artery without demonstrable abdominal aortic aneurysm. Disc levels: At T12-L1, there is no nerve root edema or effacement. No disc extrusion or stenosis. At L1-2, there is mild facet hypertrophy bilaterally. There is slight diffuse disc bulging. No nerve root edema or effacement. No disc extrusion or stenosis. At L2-3, there is mild facet hypertrophy bilaterally. There is a moderate diffuse disc bulging without nerve root edema or effacement. No disc extrusion or stenosis. At L3-4, there is moderate facet hypertrophy bilaterally. There is mild diffuse disc bulging without nerve root edema or effacement. No disc extrusion or stenosis. At L4-5, there is moderate facet hypertrophy bilaterally. There is broad-based disc bulging. No disc  extrusion. There is, however, mild to moderate generalized spinal stenosis at L4-5 due to generalized disc bulging coupled with facet and ligamentum flava hypertrophy bilaterally. At L5-S1, there is moderate facet hypertrophy bilaterally. There is broad-based disc protrusion without nerve root edema or effacement. No frank disc extrusion or stenosis. At S1-2, there is moderate facet hypertrophy bilaterally. No nerve root edema or effacement. No disc extrusion or stenosis. There is osteoarthritic change in the assimilation joint on the right at S1-2. There is mild osteoarthritic change in the sacroiliac joints bilaterally. No sacroiliitis evident. 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 No disc extrusion on this study. Facet hypertrophy at multiple levels. No fracture or spondylolisthesis. 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 Aortoiliac atherosclerosis without demonstrable aneurysm. Electronically Signed   By: Lowella Grip III M.D.   On: 12/13/2016 09:26   Mr Lumbar Spine W Wo Contrast  Result Date: 12/13/2016 CLINICAL DATA:  Mid to low back pain radiating to LEFT hip. Progressive worsening. EXAM: MRI LUMBAR SPINE WITHOUT AND WITH CONTRAST TECHNIQUE: Multiplanar and multiecho pulse sequences of the lumbar spine were obtained without and with intravenous contrast. CONTRAST:  44m MULTIHANCE GADOBENATE DIMEGLUMINE 529 MG/ML IV SOLN COMPARISON:  CT lumbar earlier today. FINDINGS: Segmentation: Transitional anatomy. S1-S2 is well-developed due to partial assimilation of S1 into the sacrum. Alignment:  Anatomic Vertebrae: No worrisome osseous lesion. Congenitally short pedicles. No abnormal postcontrast enhancement. Conus medullaris: Extends  to the L1 level and appears normal. Paraspinal and other soft tissues: Negative. Disc levels: L1-L2:  Unremarkable. L2-L3:  Annular bulge.  No impingement. L3-L4:  And bulge.  No impingement. L4-L5: Unremarkable disc space. Posterior element hypertrophy. Short pedicles. Mild stenosis without definite impingement. L5-S1: Unremarkable disc space. Posterior element hypertrophy. Short pedicles. Mild stenosis without definite impingement. S1-S2 transitional interspace.  No impingement. IMPRESSION: No disc pathology of significance. 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 No definite subarticular zone or foraminal zone narrowing is established. Electronically Signed   By: JStaci RighterM.D.   On: 12/13/2016 13:00   Mr Hip Left W Wo Contrast  Result Date: 12/18/2016 CLINICAL DATA:  Patient admitted to the hospital 12/13/2016 with bacteremia. Severe left hip pain. No known injury. History of IV drug abuse. EXAM: MRI OF THE LEFT HIP WITHOUT AND WITH CONTRAST TECHNIQUE: Multiplanar, multisequence MR imaging was performed both before and after administration of intravenous contrast. CONTRAST:  15 cc MultiHance IV. COMPARISON:  Plain films left hip 12/13/2016. FINDINGS: Bones: There is marrow edema and enhancement about the left sacroiliac joint most consistent with osteomyelitis. Marrow signal is diffusely heterogeneous as can be seen in cigarette smoking or obesity. There is no avascular necrosis of the femoral heads. No fracture. Articular cartilage and labrum Articular cartilage:  Normal. Labrum:  Intact. Joint or bursal effusion Joint effusion:  Small left SI joint effusion is seen. Bursae:  Negative. Muscles and tendons Muscles and tendons: Intact. There is some edema and enhancement in musculature about the left SI joint including the iliacus and gluteus medius. No intramuscular fluid collection is identified although  visualization is somewhat limited on this hip MRI. Other findings Miscellaneous: Imaged intrapelvic contents demonstrate no acute abnormality. IMPRESSION: Findings most consistent with a septic SI joint on the left with surrounding osteomyelitis and myositis. No abscess is identified. Electronically Signed   By: Inge Rise M.D.   On: 12/18/2016 08:22   Dg Chest Port 1 View  Result Date: 12/13/2016 CLINICAL DATA:  Sepsis. EXAM: PORTABLE CHEST 1 VIEW COMPARISON:  07/20/2014 FINDINGS: The heart size is normal. There are patchy densities in the lung bases bilaterally suspicious for infiltrates. No pulmonary edema. IMPRESSION: Suspect bilateral lower lobe infiltrates. Electronically Signed   By: Nolon Nations M.D.   On: 12/13/2016 22:58   Dg Hip Unilat W Or Wo Pelvis 2-3 Views Left  Result Date: 12/13/2016 CLINICAL DATA:  Mid and lower back pain radiating to the left hip. EXAM: DG HIP (WITH OR WITHOUT PELVIS) 2-3V LEFT COMPARISON:  None. FINDINGS: Pelvic bony ring is intact. No gross abnormality to the right hip. Left hip is located without a fracture. No significant joint space narrowing in either hip. Gas and stool in the pelvis. IMPRESSION: No acute abnormality in the pelvis or left hip. Electronically Signed   By: Markus Daft M.D.   On: 12/13/2016 09:12     CBC  Recent Labs Lab 12/23/16 0811 12/27/16 0500  WBC 7.8 9.7  HGB 12.4 12.3  HCT 38.0 38.3  PLT 390 600*  MCV 78.8 79.3  MCH 25.7* 25.5*  MCHC 32.6 32.1  RDW 14.4 13.8    Chemistries   Recent Labs Lab 12/23/16 0811 12/27/16 0500  NA 134* 135  K 4.6 3.8  CL 102 98*  CO2 25 24  GLUCOSE 110* 192*  BUN 16 12  CREATININE 0.55 0.68  CALCIUM 8.7* 9.2   ------------------------------------------------------------------------------------------------------------------ estimated creatinine clearance is 86.1 mL/min (by C-G formula based on SCr of 0.68  mg/dL). ------------------------------------------------------------------------------------------------------------------ No results for input(s): HGBA1C in the last 72 hours. ------------------------------------------------------------------------------------------------------------------ No results for input(s): CHOL, HDL, LDLCALC, TRIG, CHOLHDL, LDLDIRECT in the last 72 hours. ------------------------------------------------------------------------------------------------------------------ No results for input(s): TSH, T4TOTAL, T3FREE, THYROIDAB in the last 72 hours.  Invalid input(s): FREET3 ------------------------------------------------------------------------------------------------------------------ No results for input(s): VITAMINB12, FOLATE, FERRITIN, TIBC, IRON, RETICCTPCT in the last 72 hours.  Coagulation profile No results for input(s): INR, PROTIME in the last 168 hours.  No results for input(s): DDIMER in the last 72 hours.  Cardiac Enzymes No results for input(s): CKMB, TROPONINI, MYOGLOBIN in the last 168 hours.  Invalid input(s): CK ------------------------------------------------------------------------------------------------------------------ Invalid  input(s): POCBNP   CBG:  Recent Labs Lab 12/22/16 1716  GLUCAP 131*       Studies: No results found.    Lab Results  Component Value Date   HGBA1C 5.8 (H) 12/13/2016   Lab Results  Component Value Date   CREATININE 0.68 12/27/2016       Scheduled Meds: .  ceFAZolin (ANCEF) IV  2 g Intravenous Q8H  . diclofenac sodium  4 g Topical QID  . enoxaparin (LOVENOX) injection  40 mg Subcutaneous Q24H  . lidocaine  1 patch Transdermal Q24H  . methocarbamol  500 mg Oral TID  . nicotine  21 mg Transdermal Daily  . nystatin   Topical TID  . pneumococcal 23 valent vaccine  0.5 mL Intramuscular Tomorrow-1000  . pregabalin  75 mg Oral BID   Continuous Infusions:   LOS: 2 days    Time spent: >30  MINS    Reyne Dumas  [facility] Pager [phone]. If 7PM-7AM, please contact night-coverage at [email or web address], password [facility] 12/29/2016, 8:33 AM  LOS: 2 days

## 2016-12-29 NOTE — Care Management Note (Addendum)
Case Management Note  Patient Details  Name: Holly Weber MRN: [id number] Date of Birth: Feb 05, 1966  Subjective/Objective:      CM following for progression and d/c planning.               Action/Plan: 12/29/2016 per report pt left hospital AMA after placement of PICC for IV antibiotics and was readmitted. Pt with hx of substance abuse. Most likely will need SNF placement to assure that she receives her IV antibiotics. Will follow for any CM needs.  11:30 am Noted plan now for IV Oritivancin 1238m x one then po Keflex possible d/c tomorrow 12/30/2016. CM will review for possible assistance with Keflex.  [facility] 12/[facility]/2016. Will call in am.    Expected Discharge Date:                  [place] Referral:  Clinical Social Work  Discharge planning Services  CM Consult  Post Acute Care Choice:  NA Choice offered to:  NA  DME Arranged:    DME Agency:     HH Arranged:    [facility]Ag[facility]:     Status of Service:  In process, will continue to follow  If discussed at Long Length of Stay Meetings, dates discussed:    Additional Comments:  RTiwana, Chavis RN 12/29/2016, 11:27 AM

## 2016-12-30 DIAGNOSIS — F1990 Other psychoactive substance use, unspecified, uncomplicated: Secondary | ICD-10-CM

## 2016-12-30 LAB — COMPREHENSIVE METABOLIC PANEL
ALBUMIN: 2.9 g/dL — AB (ref 3.5–5.0)
ALT: 16 U/L (ref 14–54)
ANION GAP: 11 (ref 5–15)
AST: 24 U/L (ref 15–41)
Alkaline Phosphatase: 148 U/L — ABNORMAL HIGH (ref 38–126)
BUN: 11 mg/dL (ref 6–20)
CHLORIDE: 101 mmol/L (ref 101–111)
CO2: 25 mmol/L (ref 22–32)
Calcium: 9 mg/dL (ref 8.9–10.3)
Creatinine, Ser: 0.72 mg/dL (ref 0.44–1.00)
GFR calc Af Amer: >60 mL/min (ref >60)
GFR calc non Af Amer: >60 mL/min (ref >60)
GLUCOSE: 94 mg/dL (ref 65–99)
POTASSIUM: 3.9 mmol/L (ref 3.5–5.1)
Sodium: 137 mmol/L (ref 135–145)
TOTAL PROTEIN: 7.7 g/dL (ref 6.5–8.1)
Total Bilirubin: 0.6 mg/dL (ref 0.3–1.2)

## 2016-12-30 LAB — CBC
HCT: 38.4 % (ref 36.0–46.0)
Hemoglobin: 12.2 g/dL (ref 12.0–15.0)
MCH: 25.5 pg — ABNORMAL LOW (ref 26.0–34.0)
MCHC: 31.8 g/dL (ref 30.0–36.0)
MCV: 80.2 fL (ref 78.0–100.0)
PLATELETS: 410 10*3/uL — AB (ref 150–400)
RBC: 4.79 MIL/uL (ref 3.87–5.11)
RDW: 13.8 % (ref 11.5–15.5)
WBC: 6.4 10*3/uL (ref 4.0–10.5)

## 2016-12-30 MED ORDER — CEPHALEXIN 500 MG PO CAPS: 500.0000 mg | ORAL_CAPSULE | Freq: Four times a day (QID) | ORAL | 0 refills | Status: AC

## 2016-12-30 MED ORDER — NICOTINE 21 MG/24HR TD PT24: 21.0000 mg | MEDICATED_PATCH | Freq: Every day | TRANSDERMAL | 0 refills | Status: DC

## 2016-12-30 MED ORDER — METHOCARBAMOL 500 MG PO TABS: 500.0000 mg | ORAL_TABLET | Freq: Four times a day (QID) | ORAL | 0 refills | Status: DC | PRN

## 2016-12-30 MED ORDER — GABAPENTIN 300 MG PO CAPS: 300.0000 mg | ORAL_CAPSULE | Freq: Three times a day (TID) | ORAL | 1 refills | Status: DC

## 2016-12-30 MED ORDER — LIDOCAINE 5 % EX PTCH: 2.0000 | MEDICATED_PATCH | CUTANEOUS | 0 refills | Status: DC

## 2016-12-30 MED ORDER — TRAMADOL HCL 50 MG PO TABS: 100.0000 mg | ORAL_TABLET | Freq: Four times a day (QID) | ORAL | 1 refills | Status: DC | PRN

## 2016-12-30 NOTE — Clinical Social Work Note (Signed)
CSW advised that patient wanted to talk with a SW. Visit made to room and patient asked about rehab facilities and was advised that she did not need a rehab facility as she walks unassisted and patient in agreement. Patient requested and was provided with a bus pass to get home.  Aleesha Ringstad Givens, MSW, LCSW Licensed Clinical Social Worker [place] [phone]

## 2016-12-30 NOTE — Care Management Note (Signed)
Case Management Note  Patient Details  Name: Holly Weber MRN: [id number] Date of Birth: 1966-01-23  Subjective/Objective:      CM following for progression and d/c planning.               Action/Plan: 12/30/2016 MATCH letter completed and ready for pt RN to give to pt upon d/c. Noted orders for HHPT and [facility], as well as aide,however this pt has no insurance, have asked for [facility] [address] order. Pt has been scheduled for hospital followup appointment at [facility]   Expected Discharge Date:     12/30/2016             Expected Discharge Plan:  [name]  In-House Referral:  Clinical Social Work  Discharge planning Services  CM Consult  Post Acute Care Choice:  Durable Medical Equipment, Home Health Choice offered to:  NA  DME Arranged:    DME Agency:  [place] Arranged:  RN [facility] Agency:  [place]  Status of Service:  Completed, signed off  If discussed at [place] of Stay Meetings, dates discussed:    Additional Comments:  Richardine, Peppers, RN 12/30/2016, 11:12 AM

## 2016-12-30 NOTE — Progress Notes (Signed)
Ma Rings to be D/C'd Home per MD order.  Discussed prescriptions and follow up appointments with the patient. Prescriptions given to patient, medication list explained in detail. Pt verbalized understanding.  Allergies as of 12/30/2016      Reactions   Asa [aspirin] Other (See Comments)   Ulcerative colitis flares.   Nsaids Other (See Comments)   Ulcerative colitis flares   Penicillins Rash, Other (See Comments)   Has patient had a PCN reaction causing immediate rash, facial/tongue/throat swelling, SOB or lightheadedness with hypotension:unsure Has patient had a PCN reaction causing severe rash involving mucus membranes or skin necrosis:unsure Has patient had a PCN reaction that required hospitalization:No Has patient had a PCN reaction occurring within the last 10 years:Yes If all of the above answers are "NO", then may proceed with Cephalosporin use.      Medication List    STOP taking these medications   naproxen sodium 220 MG tablet Commonly known as:  ANAPROX     TAKE these medications   cephALEXin 500 MG capsule Commonly known as:  KEFLEX Take 1 capsule (500 mg total) by mouth 4 (four) times daily.   gabapentin 300 MG capsule Commonly known as:  NEURONTIN Take 1 capsule (300 mg total) by mouth 3 (three) times daily.   lidocaine 5 % Commonly known as:  LIDODERM Place 2 patches onto the skin daily. Remove & Discard patch within 12 hours or as directed by MD   methocarbamol 500 MG tablet Commonly known as:  ROBAXIN Take 1 tablet (500 mg total) by mouth every 6 (six) hours as needed for muscle spasms.   nicotine 21 mg/24hr patch Commonly known as:  NICODERM CQ - dosed in mg/24 hours Place 1 patch (21 mg total) onto the skin daily. Start taking on:  12/31/2016   traMADol 50 MG tablet Commonly known as:  ULTRAM Take 2 tablets (100 mg total) by mouth every 6 (six) hours as needed for moderate pain.            Durable Medical Equipment        Start      Ordered   12/28/16 0950  For home use only DME 3 n 1  Once     12/28/16 0949   12/28/16 0950  For home use only DME Walker rolling  Once    Question:  Patient needs a walker to treat with the following condition  Answer:  Bacteremia   12/28/16 0949      Vitals:   12/30/16 0423 12/30/16 0900  BP: 116/74 113/63  Pulse: 79 77  Resp: 17 17  Temp: 97.9 F (36.6 C) 97.4 F (36.3 C)    Skin clean, dry and intact without evidence of skin break down, no evidence of skin tears noted. IV catheter discontinued intact. Site without signs and symptoms of complications. Dressing and pressure applied. Pt denies pain at this time. No complaints noted.  An After Visit Summary was printed and given to the patient. Patient escorted via [place], and D/C home via private auto.  Haywood Lasso BSN, RN [facility] Phone [phone]

## 2016-12-30 NOTE — Discharge Summary (Signed)
Physician Discharge Summary  JESSYKA AUSTRIA MRN: [id number] DOB/AGE: Aug 19, 1966 51 y.o.  PCP: No PCP Per Patient   Admit date: 12/27/2016 Discharge date: 12/30/2016  Discharge Diagnoses:    Principal Problem:   MSSA (methicillin susceptible Staphylococcus aureus) infection Active Problems:   Cocaine abuse   IV drug user   Bacteremia   Polysubstance abuse   Osteomyelitis of left hip (HCC)    Follow-up recommendations Follow-up with PCP in 3-5 days , including all  additional recommended appointments as below Follow-up CBC, CMP in 3-5 days Patient to follow-up with infectious disease in 3-4 weeks Patient is being discharged with home health and she is refusing SNF     Current Discharge Medication List    START taking these medications   Details  cephALEXin (KEFLEX) 500 MG capsule Take 1 capsule (500 mg total) by mouth 4 (four) times daily. Qty: 84 capsule, Refills: 0    gabapentin (NEURONTIN) 300 MG capsule Take 1 capsule (300 mg total) by mouth 3 (three) times daily. Qty: 90 capsule, Refills: 1    lidocaine (LIDODERM) 5 % Place 2 patches onto the skin daily. Remove & Discard patch within 12 hours or as directed by MD Qty: 30 patch, Refills: 0    methocarbamol (ROBAXIN) 500 MG tablet Take 1 tablet (500 mg total) by mouth every 6 (six) hours as needed for muscle spasms. Qty: 45 tablet, Refills: 0    nicotine (NICODERM CQ - DOSED IN MG/24 HOURS) 21 mg/24hr patch Place 1 patch (21 mg total) onto the skin daily. Qty: 28 patch, Refills: 0    traMADol (ULTRAM) 50 MG tablet Take 2 tablets (100 mg total) by mouth every 6 (six) hours as needed for moderate pain. Qty: 30 tablet, Refills: 1      STOP taking these medications     naproxen sodium (ANAPROX) 220 MG tablet          Discharge Condition: Stable   Discharge Instructions Get Medicines reviewed and adjusted: Please take all your medications with you for your next visit with your Primary MD  Please request  your Primary MD to go over all hospital tests and procedure/radiological results at the follow up, please ask your Primary MD to get all Hospital records sent to his/her office.  If you experience worsening of your admission symptoms, develop shortness of breath, life threatening emergency, suicidal or homicidal thoughts you must seek medical attention immediately by calling 911 or calling your MD immediately if symptoms less severe.  You must read complete instructions/literature along with all the possible adverse reactions/side effects for all the Medicines you take and that have been prescribed to you. Take any new Medicines after you have completely understood and accpet all the possible adverse reactions/side effects.   Do not drive when taking Pain medications.   Do not take more than prescribed Pain, Sleep and Anxiety Medications  Special Instructions: If you have smoked or chewed Tobacco in the last 2 yrs please stop smoking, stop any regular Alcohol and or any Recreational drug use.  Wear Seat belts while driving.  Please note  You were cared for by a hospitalist during your hospital stay. Once you are discharged, your primary care physician will handle any further medical issues. Please note that NO REFILLS for any discharge medications will be authorized once you are discharged, as it is imperative that you return to your primary care physician (or establish a relationship with a primary care physician if you do not have one)  for your aftercare needs so that they can reassess your need for medications and monitor your lab values.     Allergies  Allergen Reactions  . Asa [Aspirin] Other (See Comments)    Ulcerative colitis flares.  . Nsaids Other (See Comments)    Ulcerative colitis flares  . Penicillins Rash and Other (See Comments)    Has patient had a PCN reaction causing immediate rash, facial/tongue/throat swelling, SOB or lightheadedness with hypotension:unsure Has  patient had a PCN reaction causing severe rash involving mucus membranes or skin necrosis:unsure Has patient had a PCN reaction that required hospitalization:No Has patient had a PCN reaction occurring within the last 10 years:Yes If all of the above answers are "NO", then may proceed with Cephalosporin use.       Disposition: Discharged with home health   Consults:  ID-via telephone    Significant Diagnostic Studies:  Ct Lumbar Spine Wo Contrast  Result Date: 12/13/2016 CLINICAL DATA:  Lumbago with left-sided radicular symptoms EXAM: CT LUMBAR SPINE WITHOUT CONTRAST TECHNIQUE: Multidetector CT imaging of the lumbar spine was performed without intravenous contrast administration. Multiplanar CT image reconstructions were also generated. COMPARISON:  None. FINDINGS: Segmentation: There are 5 non-rib-bearing lumbar type vertebral bodies. 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 There is a well-defined S1-S2 interspace. Alignment: There is no spondylolisthesis. Vertebrae: No fracture.  No blastic or lytic bone lesions. Paraspinal and other soft tissues: No paraspinous lesions are evident. There are foci of calcification in the abdominal aorta and common iliac artery without demonstrable abdominal aortic aneurysm. Disc levels: At T12-L1, there is no nerve root edema or effacement. No disc extrusion or stenosis. At L1-2, there is mild facet hypertrophy bilaterally. There is slight diffuse disc bulging. No nerve root edema or effacement. No disc extrusion or stenosis. At L2-3, there is mild facet hypertrophy bilaterally. There is a moderate diffuse disc bulging without nerve root edema or effacement. No disc extrusion or stenosis. At L3-4, there is moderate facet hypertrophy bilaterally. There is mild diffuse disc bulging without nerve root edema or effacement. No disc extrusion or stenosis. At L4-5, there is moderate facet hypertrophy bilaterally. There is broad-based  disc bulging. No disc extrusion. There is, however, mild to moderate generalized spinal stenosis at L4-5 due to generalized disc bulging coupled with facet and ligamentum flava hypertrophy bilaterally. At L5-S1, there is moderate facet hypertrophy bilaterally. There is broad-based disc protrusion without nerve root edema or effacement. No frank disc extrusion or stenosis. At S1-2, there is moderate facet hypertrophy bilaterally. No nerve root edema or effacement. No disc extrusion or stenosis. There is osteoarthritic change in the assimilation joint on the right at S1-2. There is mild osteoarthritic change in the sacroiliac joints bilaterally. No sacroiliitis evident. 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 No disc extrusion on this study. Facet hypertrophy at multiple levels. No fracture or spondylolisthesis. 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 Aortoiliac atherosclerosis without demonstrable aneurysm. Electronically Signed   By: Lowella Grip III M.D.   On: 12/13/2016 09:26   Mr Lumbar Spine W Wo Contrast  Result Date: 12/13/2016 CLINICAL DATA:  Mid to low back pain radiating to LEFT hip. Progressive worsening. EXAM: MRI LUMBAR SPINE WITHOUT AND WITH CONTRAST TECHNIQUE: Multiplanar and multiecho pulse sequences of the lumbar  spine were obtained without and with intravenous contrast. CONTRAST:  48m MULTIHANCE GADOBENATE DIMEGLUMINE 529 MG/ML IV SOLN COMPARISON:  CT lumbar earlier today. FINDINGS: Segmentation: Transitional anatomy. S1-S2 is well-developed due to partial assimilation of S1 into the sacrum. Alignment:  Anatomic Vertebrae: No worrisome osseous lesion. Congenitally short pedicles. No abnormal postcontrast enhancement.  Conus medullaris: Extends to the L1 level and appears normal. Paraspinal and other soft tissues: Negative. Disc levels: L1-L2:  Unremarkable. L2-L3:  Annular bulge.  No impingement. L3-L4:  And bulge.  No impingement. L4-L5: Unremarkable disc space. Posterior element hypertrophy. Short pedicles. Mild stenosis without definite impingement. L5-S1: Unremarkable disc space. Posterior element hypertrophy. Short pedicles. Mild stenosis without definite impingement. S1-S2 transitional interspace.  No impingement. IMPRESSION: No disc pathology of significance. 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 No definite subarticular zone or foraminal zone narrowing is established. Electronically Signed   By: JStaci RighterM.D.   On: 12/13/2016 13:00   Mr Hip Left W Wo Contrast  Result Date: 12/18/2016 CLINICAL DATA:  Patient admitted to the hospital 12/13/2016 with bacteremia. Severe left hip pain. No known injury. History of IV drug abuse. EXAM: MRI OF THE LEFT HIP WITHOUT AND WITH CONTRAST TECHNIQUE: Multiplanar, multisequence MR imaging was performed both before and after administration of intravenous contrast. CONTRAST:  15 cc MultiHance IV. COMPARISON:  Plain films left hip 12/13/2016. FINDINGS: Bones: There is marrow edema and enhancement about the left sacroiliac joint most consistent with osteomyelitis. Marrow signal is diffusely heterogeneous as can be seen in cigarette smoking or obesity. There is no avascular necrosis of the femoral heads. No fracture. Articular cartilage and labrum Articular cartilage:  Normal. Labrum:  Intact. Joint or bursal effusion Joint effusion:  Small left SI joint effusion is seen. Bursae:  Negative. Muscles and tendons Muscles and tendons: Intact. There is some edema and enhancement in musculature about the left SI joint including the iliacus and gluteus medius. No intramuscular fluid  collection is identified although visualization is somewhat limited on this hip MRI. Other findings Miscellaneous: Imaged intrapelvic contents demonstrate no acute abnormality. IMPRESSION: Findings most consistent with a septic SI joint on the left with surrounding osteomyelitis and myositis. No abscess is identified. Electronically Signed   By: TInge RiseM.D.   On: 12/18/2016 08:22   Dg Chest Port 1 View  Result Date: 12/13/2016 CLINICAL DATA:  Sepsis. EXAM: PORTABLE CHEST 1 VIEW COMPARISON:  07/20/2014 FINDINGS: The heart size is normal. There are patchy densities in the lung bases bilaterally suspicious for infiltrates. No pulmonary edema. IMPRESSION: Suspect bilateral lower lobe infiltrates. Electronically Signed   By: ENolon NationsM.D.   On: 12/13/2016 22:58   Dg Hip Unilat W Or Wo Pelvis 2-3 Views Left  Result Date: 12/13/2016 CLINICAL DATA:  Mid and lower back pain radiating to the left hip. EXAM: DG HIP (WITH OR WITHOUT PELVIS) 2-3V LEFT COMPARISON:  None. FINDINGS: Pelvic bony ring is intact. No gross abnormality to the right hip. Left hip is located without a fracture. No significant joint space narrowing in either hip. Gas and stool in the pelvis. IMPRESSION: No acute abnormality in the pelvis or left hip. Electronically Signed   By: AMarkus DaftM.D.   On: 12/13/2016 09:12       Filed Weights   12/27/16 0653 12/27/16 2236 12/28/16 2254  Weight: 73.4 kg (161 lb 14.4 oz) 69.9 kg (154 lb) 74.8 kg (165 lb)  Microbiology: Recent Results (from the past 240 hour(s))  Culture, blood (routine x 2)     Status: None   Collection Time: 12/22/16  4:30 AM  Result Value Ref Range Status   Specimen Description BLOOD RIGHT HAND  Final   Special Requests IN PEDIATRIC BOTTLE Blood Culture adequate volume  Final   Culture NO GROWTH 5 DAYS  Final   Report Status 12/27/2016 FINAL  Final  Culture, blood (routine x 2)     Status: None   Collection Time: 12/22/16  4:45 AM  Result Value  Ref Range Status   Specimen Description BLOOD RIGHT FOREARM  Final   Special Requests IN PEDIATRIC BOTTLE Blood Culture adequate volume  Final   Culture NO GROWTH 5 DAYS  Final   Report Status 12/27/2016 FINAL  Final  Blood culture (routine x 2)     Status: None (Preliminary result)   Collection Time: 12/27/16  4:50 AM  Result Value Ref Range Status   Specimen Description BLOOD PICC LINE  Final   Special Requests   Final    IN PEDIATRIC BOTTLE Blood Culture results may not be optimal due to an excessive volume of blood received in culture bottles   Culture NO GROWTH 1 DAY  Final   Report Status PENDING  Incomplete  Blood culture (routine x 2)     Status: None (Preliminary result)   Collection Time: 12/27/16  5:51 AM  Result Value Ref Range Status   Specimen Description BLOOD RIGHT HAND  Final   Special Requests   Final    BOTTLES DRAWN AEROBIC AND ANAEROBIC Blood Culture adequate volume   Culture NO GROWTH 1 DAY  Final   Report Status PENDING  Incomplete       Blood Culture    Component Value Date/Time   SDES BLOOD RIGHT HAND 12/27/2016 0551   SPECREQUEST  12/27/2016 0551    BOTTLES DRAWN AEROBIC AND ANAEROBIC Blood Culture adequate volume   CULT NO GROWTH 1 DAY 12/27/2016 0551   REPTSTATUS PENDING 12/27/2016 0551      Labs: Results for orders placed or performed during the hospital encounter of 12/27/16 (from the past 48 hour(s))  CBC     Status: Abnormal   Collection Time: 12/30/16  5:45 AM  Result Value Ref Range   WBC 6.4 4.0 - 10.5 K/uL   RBC 4.79 3.87 - 5.11 MIL/uL   Hemoglobin 12.2 12.0 - 15.0 g/dL   HCT 38.4 36.0 - 46.0 %   MCV 80.2 78.0 - 100.0 fL   MCH 25.5 (L) 26.0 - 34.0 pg   MCHC 31.8 30.0 - 36.0 g/dL   RDW 13.8 11.5 - 15.5 %   Platelets 410 (H) 150 - 400 K/uL  Comprehensive metabolic panel     Status: Abnormal   Collection Time: 12/30/16  5:45 AM  Result Value Ref Range   Sodium 137 135 - 145 mmol/L   Potassium 3.9 3.5 - 5.1 mmol/L   Chloride 101  101 - 111 mmol/L   CO2 25 22 - 32 mmol/L   Glucose, Bld 94 65 - 99 mg/dL   BUN 11 6 - 20 mg/dL   Creatinine, Ser 0.72 0.44 - 1.00 mg/dL   Calcium 9.0 8.9 - 10.3 mg/dL   Total Protein 7.7 6.5 - 8.1 g/dL   Albumin 2.9 (L) 3.5 - 5.0 g/dL   AST 24 15 - 41 U/L   ALT 16 14 - 54 U/L   Alkaline Phosphatase 148 (H) 38 -  126 U/L   Total Bilirubin 0.6 0.3 - 1.2 mg/dL   GFR calc non Af Amer >60 >60 mL/min   GFR calc Af Amer >60 >60 mL/min    Comment: (NOTE) The eGFR has been calculated using the CKD EPI equation. This calculation has not been validated in all clinical situations. eGFR's persistently <60 mL/min signify possible Chronic Kidney Disease.    Anion gap 11 5 - 15     Lipid Panel  No results found for: CHOL, TRIG, HDL, CHOLHDL, VLDL, LDLCALC, LDLDIRECT   Lab Results  Component Value Date   HGBA1C 5.8 (H) 12/13/2016     Lab Results  Component Value Date   CREATININE 0.72 12/30/2016     HPI :   51 y.o.femalewith medical history significant of ulcerative colitis, depression, hepatitis, hypertension, CVA, hypothyroidism presenting with persistent left hip and back pain,. She was originally admitted on 12/13/2016 and found to have MSSA bacteremia with left hip osteomyelitis and myositis.She was put on Ancef with resolution of sepsis, further BCx during that admit and last admit were negative. Patient first left the hospital AMA on 4/1 she was then readmitted 4/2 with positive UDS for cocaine and THC at that time and report of a syringe with clear substance confiscated from patient in ED. She had a PICC line placed on 4/3, and then left a second time AMA on 4/4. She is back on 4/7 and reports taking cocaine and THC and her UDS is positive for both.  She is put back on the ancef and PICC line removed   HOSPITAL COURSE:   # Sepsis due to MSSA bacteremia:  TEE 3/ 28 with no vegetation.  -Planned for 6 weeks of IV cefazolin as per Dr. Megan Salon. Unfortunately due to  noncompliance IV antibiotics were discontinued after consultation with infectious disease, Dr. Johnnye Sima -follow up repeat cultures from 4/7 , no growth so far Patient was supposed to continue antibiotics through 01/20/17, based on previous ID notes.  She states that she missed a total of 3 days of treatment She had a PICC line placed on 4/3, which was removed due to noncompliance with IV antibiotics, the fact that she left AMA twice Discussed with ID-she was given  [place] and  switched to keflex as she is not compliant , until 5/1    # Back pain with left sided sciatica:  -CT/MRI if spine showed no acute findings. MRI of left hip obtained because of severe pain. 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 Antibiotics as above. No surgical intervention as per orthopedics during previous admissions.   # Pain management/drug seeking behavior: The patient looks mostly comfortable but when we start talking about her symptoms she starts crying and asking for IV Dilaudid.  minimize IV pain medications.Continue  Robaxin, and added tramadol and Lidoderm patch  Continue PT OT evaluation and treatment. Patient will be discharged with home health  # Polysubstance abuse: Urine toxicology positive for cocaine and THC. Patient was educated at bedside today.We will rescreen urine toxicology today. Discussed with the patient hours.  #Hypokalemia, hyponatremia: Electrolyte repleted. Potassium improved. Monitor labs. Encourage oral intake.  # Hypertension: monitor BP.  # Tobacco abuse: continue nicotine patch.    # Hyperglycemia - place on diabetic diet   #Hyponatremia, improved   # Thrombocytosis-likely reactive, will repeat    Discharge Exam:   Blood pressure 113/63, pulse 77, temperature 97.4 F (36.3 C), temperature source Oral, resp. rate 17, height 5'  5" (1.651 m), weight 74.8 kg (165 lb), SpO2 96 %.  General exam: Appears calm  and comfortable  Respiratory system: Clear to auscultation. Respiratory effort normal. Cardiovascular system: S1 & S2 heard, RRR. No JVD, murmurs, rubs, gallops or clicks. No pedal edema. Gastrointestinal system: Abdomen is nondistended, soft and nontender. No organomegaly or masses felt. Normal bowel sounds heard. Central nervous system: Alert and oriented. No focal neurological deficits. Extremities: Symmetric 5 x 5 power. Skin: No rashes, lesions or ulcers Psychiatry: Judgement and insight appear normal. Mood & affect appropriate.     Follow-up Information    PCP. Schedule an appointment as soon as possible for a visit.   Why:  3-5 days       Michel Bickers, MD. Call.   Specialty:  Infectious Diseases Why:  Follow-up with infectious disease for MSSA bacteremia Contact information: [address] [place] [address] [phone]           Signed: Reyne Dumas 12/30/2016, 9:04 AM        Time spent >45 mins

## 2017-01-01 ENCOUNTER — Ambulatory Visit: Payer: Self-pay | Attending: Internal Medicine | Admitting: Physician Assistant

## 2017-01-01 ENCOUNTER — Encounter: Payer: Self-pay | Admitting: Physician Assistant

## 2017-01-01 VITALS — BP 124/81 | HR 95 | Temp 98.3°F | Resp 16 | Wt 160.2 lb

## 2017-01-01 DIAGNOSIS — F329 Major depressive disorder, single episode, unspecified: Secondary | ICD-10-CM | POA: Insufficient documentation

## 2017-01-01 DIAGNOSIS — E079 Disorder of thyroid, unspecified: Secondary | ICD-10-CM | POA: Insufficient documentation

## 2017-01-01 DIAGNOSIS — M869 Osteomyelitis, unspecified: Secondary | ICD-10-CM

## 2017-01-01 DIAGNOSIS — F419 Anxiety disorder, unspecified: Secondary | ICD-10-CM | POA: Insufficient documentation

## 2017-01-01 DIAGNOSIS — Z886 Allergy status to analgesic agent status: Secondary | ICD-10-CM | POA: Insufficient documentation

## 2017-01-01 DIAGNOSIS — Z8673 Personal history of transient ischemic attack (TIA), and cerebral infarction without residual deficits: Secondary | ICD-10-CM | POA: Insufficient documentation

## 2017-01-01 DIAGNOSIS — Z79899 Other long term (current) drug therapy: Secondary | ICD-10-CM | POA: Insufficient documentation

## 2017-01-01 DIAGNOSIS — I10 Essential (primary) hypertension: Secondary | ICD-10-CM | POA: Insufficient documentation

## 2017-01-01 DIAGNOSIS — M25552 Pain in left hip: Secondary | ICD-10-CM | POA: Insufficient documentation

## 2017-01-01 DIAGNOSIS — Z880 Allergy status to penicillin: Secondary | ICD-10-CM | POA: Insufficient documentation

## 2017-01-01 DIAGNOSIS — B1920 Unspecified viral hepatitis C without hepatic coma: Secondary | ICD-10-CM | POA: Insufficient documentation

## 2017-01-01 DIAGNOSIS — M79606 Pain in leg, unspecified: Secondary | ICD-10-CM

## 2017-01-01 LAB — CULTURE, BLOOD (ROUTINE X 2)
CULTURE: NO GROWTH
Culture: NO GROWTH
SPECIAL REQUESTS: ADEQUATE

## 2017-01-01 MED ORDER — KETOROLAC TROMETHAMINE 60 MG/2ML IM SOLN
60.0000 mg | Freq: Once | INTRAMUSCULAR | Status: AC
  Administered 2017-01-01: 60 mg via INTRAMUSCULAR

## 2017-01-01 MED ORDER — ACETAMINOPHEN-CODEINE #3 300-30 MG PO TABS: 1.0000 | ORAL_TABLET | ORAL | 0 refills | Status: DC | PRN

## 2017-01-01 MED FILL — METHOCARBAMOL 500 MG TABLET: 500 | 15 days supply | Qty: 45 | Fill #0

## 2017-01-01 MED FILL — CEPHALEXIN 500 MG CAPSULE: 500 | 21 days supply | Qty: 84 | Fill #0

## 2017-01-01 MED FILL — ACETAMINOPHEN/COD #3 TABLET: 300-30 | 3 days supply | Qty: 15 | Fill #0

## 2017-01-01 NOTE — Progress Notes (Signed)
Holly Weber, is a 51 y.o. female  AJG:[id number]  BTD:[id number]  DOB - Jul 02, 1966  Subjective:  Chief Complaint and HPI: Holly Weber is a 51 y.o. female here today to establish care and for a follow up visit after being hospitalized 12/27/2016-4/10 and prior to that for sepsis due to MSSA that stemmed from osteomyelitis of the L hip.  She was treated with Ancef IV.  Prior to her 4/02 and 4/07 admissions, She was admitted on 3/24 and left AMA.  She was also caught injecting a clear substance that was confiscated during her subsequent hospital stay.  She has a h/o IVDU. Despite saying she hasn't used drugs in over a month, she tested positive for opiates, cocaine, and THC as recently as 10 days ago.    See hospital notes.  Today she presents still having significant pain in her L hip and is walking with a walker which is not her baseline.  She has not gotten her  Cephalexin filled and plans to pick up and start the Rx today(so no antibiotics in 2 days).  C/o subjective fever last night.  She is requesting pain meds and something for anxiety today.  ED/Hospital notes reviewed.    ROS:   Constitutional:  No f/c, No night sweats, No unexplained weight loss. EENT:  No vision changes, No blurry vision, No hearing changes. No mouth, throat, or ear problems.  Respiratory: No cough, No SOB Cardiac: No CP, no palpitations GI:  No abd pain, No N/V/D. GU: No Urinary s/sx Musculoskeletal: +hip/leg joint pain Neuro: No headache, no dizziness, no motor weakness.  Skin: No rash Endocrine:  No polydipsia. No polyuria.  Psych: Denies SI/HI  No problems updated.  ALLERGIES: Allergies  Allergen Reactions  . Asa [Aspirin] Other (See Comments)    Ulcerative colitis flares.  . Nsaids Other (See Comments)    Ulcerative colitis flares  . Penicillins Rash and Other (See Comments)    Has patient had a PCN reaction causing immediate rash, facial/tongue/throat swelling, SOB or lightheadedness with  hypotension:unsure Has patient had a PCN reaction causing severe rash involving mucus membranes or skin necrosis:unsure Has patient had a PCN reaction that required hospitalization:No Has patient had a PCN reaction occurring within the last 10 years:Yes If all of the above answers are "NO", then may proceed with Cephalosporin use.     PAST MEDICAL HISTORY: Past Medical History:  Diagnosis Date  . Colitis, ulcerative ([place])   . Depression   . Hepatitis C   . Hypertension   . Stroke ([place])   . Thyroid disease     MEDICATIONS AT HOME: Prior to Admission medications   Medication Sig Start Date End Date Taking? Authorizing Provider  cephALEXin (KEFLEX) 500 MG capsule Take 1 capsule (500 mg total) by mouth 4 (four) times daily. 12/30/16 01/20/17 Yes Reyne Dumas, MD  methocarbamol (ROBAXIN) 500 MG tablet Take 1 tablet (500 mg total) by mouth every 6 (six) hours as needed for muscle spasms. 12/30/16  Yes Reyne Dumas, MD  acetaminophen-codeine (TYLENOL #3) 300-30 MG tablet Take 1 tablet by mouth every 4 (four) hours as needed for moderate pain. 01/01/17   Argentina Donovan, PA-C  gabapentin (NEURONTIN) 300 MG capsule Take 1 capsule (300 mg total) by mouth 3 (three) times daily. Patient not taking: Reported on 01/01/2017 12/30/16 01/29/17  Reyne Dumas, MD  lidocaine (LIDODERM) 5 % Place 2 patches onto the skin daily. Remove & Discard patch within 12 hours or as directed by MD Patient  not taking: Reported on 01/01/2017 12/30/16   Reyne Dumas, MD  nicotine (NICODERM CQ - DOSED IN MG/24 HOURS) 21 mg/24hr patch Place 1 patch (21 mg total) onto the skin daily. Patient not taking: Reported on 01/01/2017 12/31/16   Reyne Dumas, MD  traMADol (ULTRAM) 50 MG tablet Take 2 tablets (100 mg total) by mouth every 6 (six) hours as needed for moderate pain. Patient not taking: Reported on 01/01/2017 12/30/16   Reyne Dumas, MD     Objective:  EXAM:   Vitals:   01/01/17 1008  BP: 124/81  Pulse: 95  Resp: 16    Temp: 98.3 F (36.8 C)  TempSrc: Oral  SpO2: 96%  Weight: 160 lb 3.2 oz (72.7 kg)    General appearance : A&OX3. NAD. Non-toxic-appearing.  Walking with walker.  Poor historian. HEENT: Atraumatic and Normocephalic.  PERRLA. EOM intact.  TM clear B. Mouth-MMM Neck: supple, no JVD. No cervical lymphadenopathy. No thyromegaly Chest/Lungs:  Breathing-non-labored, Good air entry bilaterally, breath sounds normal without rales, rhonchi, or wheezing  CVS: S1 S2 regular, no murmurs, gallops, rubs  Extremities: Bilateral Lower Ext shows no edema, no erythema. both legs are warm to touch with = pulse throughout.  No ROM/strength testing performed secondary to pain, but she is N-V intact and no sign of obvious abscess or skin break down. Neurology:  CN II-XII grossly intact, Non focal.   Skin:  No Rash  Data Review Lab Results  Component Value Date   HGBA1C 5.8 (H) 12/13/2016     Assessment & Plan   1. Pain of lower extremity, unspecified laterality-secondary to #2 - ketorolac (TORADOL) injection 60 mg; Inject 2 mLs (60 mg total) into the muscle once. - acetaminophen-codeine (TYLENOL #3) 300-30 MG tablet; Take 1 tablet by mouth every 4 (four) hours as needed for moderate pain.  Dispense: 15 tablet; Refill: 0 Discussed at length the dangers of taking this with other substances.  She does have legitimate pain.  She expresses understanding, and I gave her a very limited quantity.  I did not give her Ativan as she requested  2. Osteomyelitis, unspecified site, unspecified type ([place]) Must get started on Cephalexin at once.  To ED if she doesn't begin to improve or if she worsens at all.   - ketorolac (TORADOL) injection 60 mg; Inject 2 mLs (60 mg total) into the muscle once. - acetaminophen-codeine (TYLENOL #3) 300-30 MG tablet; Take 1 tablet by mouth every 4 (four) hours as needed for moderate pain.  Dispense: 15 tablet; Refill: 0 - CBC with Differential/Platelet  Patient have been counseled  extensively about nutrition and exercise  Return in about 1 week (around 01/08/2017) for assign PCP; f/up osteomyelitis.  The patient was given clear instructions to go to ER or return to medical center if symptoms don't improve, worsen or new problems develop. The patient verbalized understanding. The patient was told to call to get lab results if they haven't heard anything in the next week.     Freeman Caldron, PA-C [facility] [place], [place]   01/01/2017, 10:36 AMPatient ID: Ma Rings, female   DOB: [birth year], 51 y.o.   MRN: [id number]

## 2017-01-02 LAB — CBC WITH DIFFERENTIAL/PLATELET
BASOS ABS: 0.1 10*3/uL (ref 0.0–0.2)
Basos: 1 %
EOS (ABSOLUTE): 0 10*3/uL (ref 0.0–0.4)
Eos: 1 %
Hematocrit: 42.3 % (ref 34.0–46.6)
Hemoglobin: 14.4 g/dL (ref 11.1–15.9)
Immature Grans (Abs): 0 10*3/uL (ref 0.0–0.1)
Immature Granulocytes: 1 %
LYMPHS ABS: 1.2 10*3/uL (ref 0.7–3.1)
Lymphs: 22 %
MCH: 26.2 pg — ABNORMAL LOW (ref 26.6–33.0)
MCHC: 34 g/dL (ref 31.5–35.7)
MCV: 77 fL — ABNORMAL LOW (ref 79–97)
MONOS ABS: 0.6 10*3/uL (ref 0.1–0.9)
Monocytes: 11 %
NEUTROS ABS: 3.6 10*3/uL (ref 1.4–7.0)
Neutrophils: 64 %
PLATELETS: 391 10*3/uL — AB (ref 150–379)
RBC: 5.5 x10E6/uL — AB (ref 3.77–5.28)
RDW: 14.1 % (ref 12.3–15.4)
WBC: 5.6 10*3/uL (ref 3.4–10.8)

## 2017-01-05 ENCOUNTER — Telehealth: Payer: Self-pay | Admitting: General Practice

## 2017-01-05 NOTE — Telephone Encounter (Signed)
Call placed to [facility] to inquire about the status of the [facility] RN. Spoke to [place] who said that they didn't accept the referral that was made from the hospital due to the patient's active drug history and history of leaving AMA from the hospital    Attempted to contact [facility], RN CM to inform her that [facility] did not accept the referral that was made at discharge. . Voicemail message left with CM call back # [phone].

## 2017-01-05 NOTE — Telephone Encounter (Signed)
Holly from [employer] called the office to inform us that patient doesn't meet the criteria to receive services from them. Informed Earnest Bailey that patient was only here for hospital f/u but has yet to establish care.   Thank you

## 2017-01-05 NOTE — Telephone Encounter (Signed)
Will forward to [employer]

## 2017-01-09 ENCOUNTER — Telehealth: Payer: Self-pay

## 2017-01-09 NOTE — Telephone Encounter (Signed)
Contacted pt to go over lab results pt didn't answer lvm asking pt to give me a call at her earliest convenience  

## 2017-01-27 ENCOUNTER — Ambulatory Visit: Payer: Self-pay | Admitting: Internal Medicine

## 2017-01-29 NOTE — Discharge Summary (Signed)
Brief Narrative:  51 y.o.femalewith medical history significant of ulcerative colitis, depression, hepatitis, hypertension, CVA, hypothyroidism. Patient presenting with persistent left hip and back pain. Of note patient left the hospital less than 24 hours ago AMA. She was originally admitted on 12/13/2016 and found to have MSSA bacteremia with left hip osteomyelitis and myositis. At time of her initial presentation patient was septic. Currently patient's only complaint is worsening left hip pain. Denies any drug use since leaving the hospital or since coming into the hospital.  Patient had picc line placed for prolonged antibiotic administration for osteomyelitis. Per notes later that night patient left AMA before I was able to evaluate.  Rasheena Talmadge, [employer]

## 2017-05-14 NOTE — Addendum Note (Signed)
Addendum  created 05/14/17 1146 by Roberts Gaudy, MD   Sign clinical note

## 2017-08-01 ENCOUNTER — Other Ambulatory Visit: Payer: Self-pay

## 2017-08-01 ENCOUNTER — Ambulatory Visit (HOSPITAL_COMMUNITY)
Admission: EM | Admit: 2017-08-01 | Discharge: 2017-08-01 | Disposition: A | Discharge status: Home / Self Care | Payer: Self-pay | Attending: Radiology | Admitting: Radiology

## 2017-08-01 ENCOUNTER — Encounter (HOSPITAL_COMMUNITY): Payer: Self-pay | Admitting: Emergency Medicine

## 2017-08-01 DIAGNOSIS — I10 Essential (primary) hypertension: Secondary | ICD-10-CM | POA: Insufficient documentation

## 2017-08-01 DIAGNOSIS — F329 Major depressive disorder, single episode, unspecified: Secondary | ICD-10-CM | POA: Insufficient documentation

## 2017-08-01 DIAGNOSIS — Z880 Allergy status to penicillin: Secondary | ICD-10-CM | POA: Insufficient documentation

## 2017-08-01 DIAGNOSIS — N760 Acute vaginitis: Secondary | ICD-10-CM

## 2017-08-01 DIAGNOSIS — F17210 Nicotine dependence, cigarettes, uncomplicated: Secondary | ICD-10-CM | POA: Insufficient documentation

## 2017-08-01 DIAGNOSIS — M868X5 Other osteomyelitis, thigh: Secondary | ICD-10-CM | POA: Insufficient documentation

## 2017-08-01 DIAGNOSIS — N898 Other specified noninflammatory disorders of vagina: Secondary | ICD-10-CM | POA: Insufficient documentation

## 2017-08-01 DIAGNOSIS — R739 Hyperglycemia, unspecified: Secondary | ICD-10-CM | POA: Insufficient documentation

## 2017-08-01 DIAGNOSIS — Z79899 Other long term (current) drug therapy: Secondary | ICD-10-CM | POA: Insufficient documentation

## 2017-08-01 DIAGNOSIS — B1920 Unspecified viral hepatitis C without hepatic coma: Secondary | ICD-10-CM | POA: Insufficient documentation

## 2017-08-01 DIAGNOSIS — Z886 Allergy status to analgesic agent status: Secondary | ICD-10-CM | POA: Insufficient documentation

## 2017-08-01 DIAGNOSIS — Z8673 Personal history of transient ischemic attack (TIA), and cerebral infarction without residual deficits: Secondary | ICD-10-CM | POA: Insufficient documentation

## 2017-08-01 DIAGNOSIS — B9689 Other specified bacterial agents as the cause of diseases classified elsewhere: Secondary | ICD-10-CM

## 2017-08-01 DIAGNOSIS — E079 Disorder of thyroid, unspecified: Secondary | ICD-10-CM | POA: Insufficient documentation

## 2017-08-01 MED ORDER — METRONIDAZOLE 500 MG PO TABS: 500.0000 mg | ORAL_TABLET | Freq: Two times a day (BID) | ORAL | 0 refills | Status: DC

## 2017-08-01 MED ORDER — FLUCONAZOLE 150 MG PO TABS: 150.0000 mg | ORAL_TABLET | Freq: Every day | ORAL | 0 refills | Status: DC

## 2017-08-01 NOTE — ED Triage Notes (Signed)
Vaginal discharge onset 4-5 days ago.  Genitalia irritated and describes a "green bump/sore.

## 2017-08-01 NOTE — ED Provider Notes (Addendum)
[place]    CSN: [id number] Arrival date & time: 08/01/17  1419     History   Chief Complaint Chief Complaint  Patient presents with  . Vaginal Discharge    HPI Holly Weber is a 51 y.o. female.   52 y.o. female presents with vaginal discharge and ulceration  X 1 week. Patient describes that discharge as green and malodorous. Condition is acute in nature. Condition is made better by nothing. Condition is made worse by nothing. Patient denies any treatment  prior to there arrival at this facility. Pateint denies any fevers      Past Medical History:  Diagnosis Date  . Colitis, ulcerative ([place])   . Depression   . Hepatitis C   . Hypertension   . Stroke ([place])   . Thyroid disease     Patient Active Problem List   Diagnosis Date Noted  . MSSA (methicillin susceptible Staphylococcus aureus) infection 12/27/2016  . Bacteremia 12/22/2016  . Polysubstance abuse ([place]) 12/22/2016  . Osteomyelitis ([place]) 12/22/2016  . Hyponatremia 12/22/2016  . Osteomyelitis of left hip ([place])   . Sacroiliitis ([place])   . Staphylococcus aureus bacteremia 12/15/2016  . Pneumonia 12/15/2016  . Hyperglycemia 12/15/2016  . Normocytic anemia 12/15/2016  . IV drug user 12/15/2016  . Acute left-sided low back pain with left-sided sciatica 12/13/2016  . Cocaine abuse ([place]) 12/13/2016  . Tobacco abuse 12/13/2016  . HTN (hypertension) 12/13/2016    Past Surgical History:  Procedure Laterality Date  . ARM WOUND REPAIR / CLOSURE    . COLONOSCOPY    . TONSILLECTOMY      OB History    No data available       Home Medications    Prior to Admission medications   Medication Sig Start Date End Date Taking? Authorizing Provider  acetaminophen-codeine (TYLENOL #3) 300-30 MG tablet Take 1 tablet by mouth every 4 (four) hours as needed for moderate pain. 01/01/17   Argentina Donovan, PA-C  gabapentin (NEURONTIN) 300 MG capsule Take 1 capsule (300 mg total) by mouth 3 (three) times  daily. Patient not taking: Reported on 01/01/2017 12/30/16 01/29/17  Reyne Dumas, MD  lidocaine (LIDODERM) 5 % Place 2 patches onto the skin daily. Remove & Discard patch within 12 hours or as directed by MD Patient not taking: Reported on 01/01/2017 12/30/16   Reyne Dumas, MD  methocarbamol (ROBAXIN) 500 MG tablet Take 1 tablet (500 mg total) by mouth every 6 (six) hours as needed for muscle spasms. 12/30/16   Reyne Dumas, MD  nicotine (NICODERM CQ - DOSED IN MG/24 HOURS) 21 mg/24hr patch Place 1 patch (21 mg total) onto the skin daily. Patient not taking: Reported on 01/01/2017 12/31/16   Reyne Dumas, MD  traMADol (ULTRAM) 50 MG tablet Take 2 tablets (100 mg total) by mouth every 6 (six) hours as needed for moderate pain. Patient not taking: Reported on 01/01/2017 12/30/16   Reyne Dumas, MD    Family History No family history on file.  Social History Social History   Tobacco Use  . Smoking status: Current Every Day Smoker    Types: Cigarettes  . Smokeless tobacco: Never Used  Substance Use Topics  . Alcohol use: No  . Drug use: Yes    Types: Cocaine    Comment: last use 4 weeks ago     Allergies   Asa [aspirin]; Nsaids; and Penicillins   Review of Systems Review of Systems  Constitutional: Negative for chills and fever.  HENT: Negative for ear pain and sore throat.   Eyes: Negative for pain and visual disturbance.  Respiratory: Negative for cough and shortness of breath.   Cardiovascular: Negative for chest pain and palpitations.  Gastrointestinal: Negative for abdominal pain and vomiting.  Genitourinary: Positive for vaginal discharge and vaginal pain. Negative for dysuria and hematuria.  Musculoskeletal: Negative for arthralgias and back pain.  Skin: Negative for color change and rash.  Neurological: Negative for seizures and syncope.  All other systems reviewed and are negative.    Physical Exam Triage Vital Signs ED Triage Vitals  Enc Vitals Group     BP  08/01/17 1439 (!) 154/91     Pulse Rate 08/01/17 1439 94     Resp 08/01/17 1439 (!) 24     Temp 08/01/17 1439 98.2 F (36.8 C)     Temp Source 08/01/17 1439 Oral     SpO2 08/01/17 1439 97 %     Weight --      Height --      Head Circumference --      Peak Flow --      Pain Score 08/01/17 1437 8     Pain Loc --      Pain Edu? --      Excl. in [place]? --    No data found.  Updated Vital Signs BP (!) 154/91 (BP Location: Left Arm)   Pulse 94   Temp 98.2 F (36.8 C) (Oral)   Resp (!) 24   SpO2 97%   Visual Acuity Right Eye Distance:   Left Eye Distance:   Bilateral Distance:    Right Eye Near:   Left Eye Near:    Bilateral Near:     Physical Exam  Genitourinary:  Genitourinary Comments: Ulcerations noted to vagina. Discharge noted green in nature.      UC Treatments / Results  Labs (all labs ordered are listed, but only abnormal results are displayed) Labs Reviewed - No data to display  EKG  EKG Interpretation None       Radiology No results found.  Procedures Procedures (including critical care time)  Medications Ordered in UC Medications - No data to display   Initial Impression / Assessment and Plan / UC Course  I have reviewed the triage vital signs and the nursing notes.  Pertinent labs & imaging results that were available during my care of the patient were reviewed by me and considered in my medical decision making (see chart for details).     Final Clinical Impressions(s) / UC Diagnoses   Final diagnoses:  None    ED Discharge Orders    None       Controlled Substance Prescriptions [place] Controlled Substance Registry consulted? Not Applicable   Jacqualine Mau, NP 08/01/17 1612    Jacqualine Mau, NP 08/01/17 1624

## 2017-08-03 LAB — HSV CULTURE AND TYPING

## 2017-08-03 LAB — URINE CYTOLOGY ANCILLARY ONLY
Chlamydia: POSITIVE — AB
Neisseria Gonorrhea: NEGATIVE
TRICH (WINDOWPATH): POSITIVE — AB

## 2017-08-04 ENCOUNTER — Telehealth (HOSPITAL_COMMUNITY): Payer: Self-pay | Admitting: Internal Medicine

## 2017-08-04 LAB — AEROBIC CULTURE W GRAM STAIN (SUPERFICIAL SPECIMEN): Special Requests: NORMAL

## 2017-08-04 LAB — AEROBIC CULTURE  (SUPERFICIAL SPECIMEN)

## 2017-08-04 MED ORDER — AZITHROMYCIN 500 MG PO TABS: 1000.0000 mg | ORAL_TABLET | Freq: Once | ORAL | 0 refills | Status: AC

## 2017-08-04 NOTE — Telephone Encounter (Signed)
Clinical staff, please let patient and health department know that test for chlamydia was positive.  Rx zithromax sent to the pharmacy of record, [facility] on [address].   Test for trichomonas was also positive.  Rx metronidazole was given at the urgent care visit.  Please refrain from sexual intercourse for 7 days to give the medicine time to work.  Sexual partners need to be notified and tested/treated.  Condoms may reduce risk of reinfection.  Recheck or followup with [facility] [phone] for further evaluation if symptoms are not improving.    Culture of vaginal ulcer discharge was positive for Staph, negative for herpes virus.  This does not completely rule out the herpes virus. Rx zithromax will cover the Staph germ.  If sores are persistent/getting worse, please recheck or followup with [facility] for further evaluation.  LM

## 2017-08-05 LAB — URINE CYTOLOGY ANCILLARY ONLY: Bacterial vaginitis: NEGATIVE

## 2017-08-26 ENCOUNTER — Telehealth (HOSPITAL_COMMUNITY): Payer: Self-pay | Admitting: Emergency Medicine

## 2017-08-26 NOTE — Telephone Encounter (Signed)
Pt called today for results.  Pt was informed of positive results and told about the Rx that was sent to pharmacy on file.  Pt stated understanding.

## 2017-08-26 NOTE — Telephone Encounter (Signed)
Pt calling for results from testing done in early November.  Pt's sister states they called several times and left a message, but never had their calls returned.  Pt was advised of positive results and told that she had a Rx sent to her pharmacy on file for further treatment.  Pt stated understanding.

## 2017-12-19 ENCOUNTER — Emergency Department (HOSPITAL_BASED_OUTPATIENT_CLINIC_OR_DEPARTMENT_OTHER)
Admission: EM | Admit: 2017-12-19 | Discharge: 2017-12-19 | Disposition: A | Discharge status: Home / Self Care | Payer: Self-pay | Attending: Emergency Medicine | Admitting: Emergency Medicine

## 2017-12-19 ENCOUNTER — Emergency Department (HOSPITAL_BASED_OUTPATIENT_CLINIC_OR_DEPARTMENT_OTHER): Payer: Self-pay

## 2017-12-19 ENCOUNTER — Other Ambulatory Visit: Payer: Self-pay

## 2017-12-19 ENCOUNTER — Encounter (HOSPITAL_BASED_OUTPATIENT_CLINIC_OR_DEPARTMENT_OTHER): Payer: Self-pay | Admitting: Emergency Medicine

## 2017-12-19 DIAGNOSIS — F1410 Cocaine abuse, uncomplicated: Secondary | ICD-10-CM | POA: Insufficient documentation

## 2017-12-19 DIAGNOSIS — S52502A Unspecified fracture of the lower end of left radius, initial encounter for closed fracture: Secondary | ICD-10-CM | POA: Insufficient documentation

## 2017-12-19 DIAGNOSIS — I10 Essential (primary) hypertension: Secondary | ICD-10-CM | POA: Insufficient documentation

## 2017-12-19 DIAGNOSIS — Y9389 Activity, other specified: Secondary | ICD-10-CM | POA: Insufficient documentation

## 2017-12-19 DIAGNOSIS — Y929 Unspecified place or not applicable: Secondary | ICD-10-CM | POA: Insufficient documentation

## 2017-12-19 DIAGNOSIS — F17210 Nicotine dependence, cigarettes, uncomplicated: Secondary | ICD-10-CM | POA: Insufficient documentation

## 2017-12-19 DIAGNOSIS — Z79899 Other long term (current) drug therapy: Secondary | ICD-10-CM | POA: Insufficient documentation

## 2017-12-19 DIAGNOSIS — Z8673 Personal history of transient ischemic attack (TIA), and cerebral infarction without residual deficits: Secondary | ICD-10-CM | POA: Insufficient documentation

## 2017-12-19 DIAGNOSIS — S52612A Displaced fracture of left ulna styloid process, initial encounter for closed fracture: Secondary | ICD-10-CM | POA: Insufficient documentation

## 2017-12-19 DIAGNOSIS — W010XXA Fall on same level from slipping, tripping and stumbling without subsequent striking against object, initial encounter: Secondary | ICD-10-CM | POA: Insufficient documentation

## 2017-12-19 DIAGNOSIS — F329 Major depressive disorder, single episode, unspecified: Secondary | ICD-10-CM | POA: Insufficient documentation

## 2017-12-19 DIAGNOSIS — Y998 Other external cause status: Secondary | ICD-10-CM | POA: Insufficient documentation

## 2017-12-19 MED ORDER — FENTANYL CITRATE (PF) 100 MCG/2ML IJ SOLN
50.0000 ug | Freq: Once | INTRAMUSCULAR | Status: AC
  Administered 2017-12-19: 50 ug via INTRAVENOUS
  Filled 2017-12-19: qty 2

## 2017-12-19 MED ORDER — FENTANYL CITRATE (PF) 100 MCG/2ML IJ SOLN
50.0000 ug | Freq: Once | INTRAMUSCULAR | Status: AC
  Administered 2017-12-19: 50 ug via INTRAVENOUS

## 2017-12-19 MED ORDER — HYDROCODONE-ACETAMINOPHEN 5-325 MG PO TABS: 1.0000 | ORAL_TABLET | Freq: Four times a day (QID) | ORAL | 0 refills | Status: DC | PRN

## 2017-12-19 MED ORDER — ACETAMINOPHEN 500 MG PO TABS: 1000.0000 mg | ORAL_TABLET | Freq: Three times a day (TID) | ORAL | 0 refills | Status: DC | PRN

## 2017-12-19 MED ORDER — OXYCODONE-ACETAMINOPHEN 5-325 MG PO TABS
1.0000 | ORAL_TABLET | ORAL | Status: DC | PRN
  Administered 2017-12-19: 1 via ORAL
  Filled 2017-12-19: qty 1

## 2017-12-19 MED ORDER — FENTANYL CITRATE (PF) 100 MCG/2ML IJ SOLN
100.0000 ug | Freq: Once | INTRAMUSCULAR | Status: AC
  Administered 2017-12-19: 100 ug via INTRAVENOUS
  Filled 2017-12-19: qty 2

## 2017-12-19 MED ORDER — BUPIVACAINE HCL 0.25 % IJ SOLN
5.0000 mL | Freq: Once | INTRAMUSCULAR | Status: AC
  Administered 2017-12-19: 5 mL
  Filled 2017-12-19: qty 1

## 2017-12-19 MED ORDER — LIDOCAINE HCL 2 % IJ SOLN
5.0000 mL | Freq: Once | INTRAMUSCULAR | Status: AC
  Administered 2017-12-19: 100 mg
  Filled 2017-12-19: qty 20

## 2017-12-19 NOTE — ED Triage Notes (Signed)
L wrist injury after falling last night. Deformity noted.

## 2017-12-19 NOTE — Discharge Instructions (Addendum)
Take either tylenol or norco as needed for pain every 4-6 hours.  Ice your wrist, 20 minutes at a time, as frequently as possible.  Call Dr. Levell July office on monday to set up a follow up appointment.  Return to the ER if you develop numbness, color change of your finger tips, or any new or concerning symptoms.

## 2017-12-19 NOTE — ED Provider Notes (Signed)
[facility] EMERGENCY DEPARTMENT Provider Note   CSN: [id number] Arrival date & time: 12/19/17  1601     History   Chief Complaint Chief Complaint  Patient presents with  . Wrist Pain    HPI Holly Weber is a 52 y.o. female presenting for evaluation of left wrist pain.  Patient states she tripped last night, falling on her outstretched hand.  She reports acute onset of left wrist pain. She was unable to come to the ER until now. She did not take anything for pain. Movement makes the pain worse, but pain is present at baseline.  Pain is described as sharp and constant. She denies numbness or tingling of the hand.  She is not on blood thinners.  She denies injury elsewhere.  She last ate just prior to coming into the ER, approximately 2 hours ago.  She denies hitting her head or loss of consciousness.  No headache, neck pain, or back pain.  HPI  Past Medical History:  Diagnosis Date  . Colitis, ulcerative ([place])   . Depression   . Hepatitis C   . Hypertension   . Stroke ([place])   . Thyroid disease     Patient Active Problem List   Diagnosis Date Noted  . MSSA (methicillin susceptible Staphylococcus aureus) infection 12/27/2016  . Bacteremia 12/22/2016  . Polysubstance abuse ([place]) 12/22/2016  . Osteomyelitis ([place]) 12/22/2016  . Hyponatremia 12/22/2016  . Osteomyelitis of left hip ([place])   . Sacroiliitis ([place])   . Staphylococcus aureus bacteremia 12/15/2016  . Pneumonia 12/15/2016  . Hyperglycemia 12/15/2016  . Normocytic anemia 12/15/2016  . IV drug user 12/15/2016  . Acute left-sided low back pain with left-sided sciatica 12/13/2016  . Cocaine abuse ([place]) 12/13/2016  . Tobacco abuse 12/13/2016  . HTN (hypertension) 12/13/2016    Past Surgical History:  Procedure Laterality Date  . ARM WOUND REPAIR / CLOSURE    . COLONOSCOPY    . TEE WITHOUT CARDIOVERSION N/A 12/17/2016   Procedure: TRANSESOPHAGEAL ECHOCARDIOGRAM (TEE);  Surgeon: Josue Hector, MD;   Location: [facility] ENDOSCOPY;  Service: Cardiovascular;  Laterality: N/A;  . TONSILLECTOMY       OB History   None      Home Medications    Prior to Admission medications   Medication Sig Start Date End Date Taking? Authorizing Provider  acetaminophen (TYLENOL) 500 MG tablet Take 2 tablets (1,000 mg total) by mouth every 8 (eight) hours as needed. 12/19/17   Rodrigus Kilker, PA-C  acetaminophen-codeine (TYLENOL #3) 300-30 MG tablet Take 1 tablet by mouth every 4 (four) hours as needed for moderate pain. 01/01/17   Argentina Donovan, PA-C  fluconazole (DIFLUCAN) 150 MG tablet Take 1 tablet (150 mg total) daily by mouth. 08/01/17   Jacqualine Mau, NP  gabapentin (NEURONTIN) 300 MG capsule Take 1 capsule (300 mg total) by mouth 3 (three) times daily. Patient not taking: Reported on 01/01/2017 12/30/16 01/29/17  Reyne Dumas, MD  HYDROcodone-acetaminophen (NORCO/VICODIN) 5-325 MG tablet Take 1 tablet by mouth every 6 (six) hours as needed for severe pain. 12/19/17   Gerald Honea, PA-C  lidocaine (LIDODERM) 5 % Place 2 patches onto the skin daily. Remove & Discard patch within 12 hours or as directed by MD 12/30/16   Reyne Dumas, MD  methocarbamol (ROBAXIN) 500 MG tablet Take 1 tablet (500 mg total) by mouth every 6 (six) hours as needed for muscle spasms. 12/30/16   Reyne Dumas, MD  metroNIDAZOLE (FLAGYL) 500 MG tablet Take  1 tablet (500 mg total) 2 (two) times daily by mouth. 08/01/17   Jacqualine Mau, NP  nicotine (NICODERM CQ - DOSED IN MG/24 HOURS) 21 mg/24hr patch Place 1 patch (21 mg total) onto the skin daily. 12/31/16   Reyne Dumas, MD  traMADol (ULTRAM) 50 MG tablet Take 2 tablets (100 mg total) by mouth every 6 (six) hours as needed for moderate pain. 12/30/16   Reyne Dumas, MD    Family History No family history on file.  Social History Social History   Tobacco Use  . Smoking status: Current Every Day Smoker    Types: Cigarettes  . Smokeless tobacco: Never  Used  Substance Use Topics  . Alcohol use: No  . Drug use: Yes    Types: Cocaine    Comment: last use 4 weeks ago     Allergies   Asa [aspirin]; Nsaids; and Penicillins   Review of Systems Review of Systems  Musculoskeletal: Positive for arthralgias and joint swelling. Negative for neck pain.  Neurological: Negative for numbness and headaches.  Hematological: Does not bruise/bleed easily.     Physical Exam Updated Vital Signs BP (!) 148/93 (BP Location: Left Arm)   Pulse 81   Temp 98.1 F (36.7 C) (Oral)   Resp 16   Ht 5' 5.5" (1.664 m)   Wt 68 kg (150 lb)   SpO2 98%   BMI 24.58 kg/m   Physical Exam  Constitutional: She is oriented to person, place, and time. She appears well-developed and well-nourished. No distress.  HENT:  Head: Normocephalic and atraumatic.  Eyes: EOM are normal.  Neck: Normal range of motion.  Pulmonary/Chest: Effort normal.  Abdominal: She exhibits no distension.  Musculoskeletal: She exhibits edema, tenderness and deformity.  Obvious deformity and swelling of the left wrist.  No laceration.  Tenderness to palpation of bilateral left wrist.  No tenderness to palpation of the carpals or fingers.  Strength intact against resistance of all fingers and thumb.  Radial pulse intact bilaterally.  Good cap refill.  Neurological: She is alert and oriented to person, place, and time.  Skin: Skin is warm. No rash noted.  Psychiatric: She has a normal mood and affect.  Nursing note and vitals reviewed.    ED Treatments / Results  Labs (all labs ordered are listed, but only abnormal results are displayed) Labs Reviewed - No data to display  EKG None  Radiology Dg Wrist Complete Left  Result Date: 12/19/2017 CLINICAL DATA:  Left wrist pain after fall today. EXAM: LEFT WRIST - COMPLETE 3+ VIEW COMPARISON:  Radiographs of November 12, 2015. FINDINGS: Moderately and posteriorly angulated fracture is seen involving the distal right radius. Moderately  displaced ulnar styloid fracture is noted. Joint spaces are intact. No soft tissue abnormality is noted. IMPRESSION: Moderately angulated distal right radial fracture. Moderately displaced ulnar styloid fracture. Electronically Signed   By: Marijo Conception, M.D.   On: 12/19/2017 17:00    Procedures Reduction of fracture Date/Time: 12/19/2017 8:13 PM Performed by: Franchot Heidelberg, PA-C Authorized by: Franchot Heidelberg, PA-C  Consent: Verbal consent obtained. Risks and benefits: risks, benefits and alternatives were discussed Consent given by: patient Patient identity confirmed: verbally with patient Local anesthesia used: yes Anesthesia: hematoma block  Anesthesia: Local anesthesia used: yes Local Anesthetic: lidocaine 2% without epinephrine and bupivacaine 0.25% without epinephrine Anesthetic total: 10 mL Patient tolerance: Patient tolerated the procedure well with no immediate complications Comments: Hematoma block and wrist reduction performed with Dr. Jeneen Rinks.     (  including critical care time)  Medications Ordered in ED Medications  oxyCODONE-acetaminophen (PERCOCET/ROXICET) 5-325 MG per tablet 1 tablet (1 tablet Oral Given 12/19/17 1617)  bupivacaine (MARCAINE) 0.25 % (with pres) injection 5 mL (5 mLs Infiltration Given by Other 12/19/17 1919)  lidocaine (XYLOCAINE) 2 % (with pres) injection 100 mg (100 mg Infiltration Given by Other 12/19/17 1918)  fentaNYL (SUBLIMAZE) injection 100 mcg (100 mcg Intravenous Given 12/19/17 1913)  fentaNYL (SUBLIMAZE) injection 50 mcg (50 mcg Intravenous Given 12/19/17 1958)  fentaNYL (SUBLIMAZE) injection 50 mcg (50 mcg Intravenous Given 12/19/17 2000)     Initial Impression / Assessment and Plan / ED Course  I have reviewed the triage vital signs and the nursing notes.  Pertinent labs & imaging results that were available during my care of the patient were reviewed by me and considered in my medical decision making (see chart for details).       Patient presenting for evaluation of left wrist pain.  Physical exam shows patient is neurovascularly intact.  X-ray reviewed and interpreted by me, shows fracture of radial and ulnar bones with angulation and displacement.  Will consult with hand for further management.  Discussed with Dr. Fredna Dow from hand surgery, who recommended reduction in the ER, sugar tong splint, and follow-up in the office.  Patient to call on Monday to set up an appointment.  Hematoma block and fentanyl given for pain. Reduction performed and sugar tong placed. Post reduction film ordered.   Pt signed out do Dr. Jeneen Rinks for f/u on post reduction film.    Final Clinical Impressions(s) / ED Diagnoses   Final diagnoses:  Closed fracture of distal end of left radius, unspecified fracture morphology, initial encounter  Closed displaced fracture of styloid process of left ulna, initial encounter    ED Discharge Orders        Ordered    HYDROcodone-acetaminophen (NORCO/VICODIN) 5-325 MG tablet  Every 6 hours PRN     12/19/17 2010    acetaminophen (TYLENOL) 500 MG tablet  Every 8 hours PRN     12/19/17 2010       Franchot Heidelberg, PA-C 12/19/17 2016    Tanna Furry, MD 12/19/17 [id number]

## 2017-12-19 NOTE — ED Notes (Signed)
Sleeping, arousable to voice, alert, NAD, calm, interactive, resps e/u, speaking in clear complete sentences, no dyspnea noted, skin W&D, VSS, reports "feel much better", pending disposition.   L arm splinted, cap refill <2sec.

## 2017-12-19 NOTE — ED Provider Notes (Signed)
Pt seen and evaluated. D/W PA. X rays reviewed. Hematoma block by PA. Reduction by myself  Reduction of dislocation Date/Time: 8:51 PM Performed by: Lolita Patella Authorized by: Lolita Patella Consent: Verbal consent obtained. Risks and benefits: risks, benefits and alternatives were discussed Consent given by: patient Required items: required blood products, implants, devices, and special equipment available Time out: Immediately prior to procedure a "time out" was called to verify the correct patient, procedure, equipment, support staff and site/side marked as required.  Patient sedated: No  Vitals: Vital signs were monitored during sedation. Patient tolerance: Patient tolerated the procedure well with no immediate complications. Joint: Left wrist Reduction technique: with gentle axial traction, wrist was extended, then flexed into anatomic position. Skin remains intact. Remains n-v intact distally. Splinted by myself with web roll, orthoglass, and Ace x 2.  Post reduction x rays show proper reduction.     Tanna Furry, MD 12/19/17 [phone]

## 2018-01-01 ENCOUNTER — Other Ambulatory Visit: Payer: Self-pay | Admitting: Orthopedic Surgery

## 2018-01-07 ENCOUNTER — Other Ambulatory Visit: Payer: Self-pay

## 2018-01-07 ENCOUNTER — Ambulatory Visit (HOSPITAL_BASED_OUTPATIENT_CLINIC_OR_DEPARTMENT_OTHER): Payer: Self-pay | Admitting: Anesthesiology

## 2018-01-07 ENCOUNTER — Encounter (HOSPITAL_BASED_OUTPATIENT_CLINIC_OR_DEPARTMENT_OTHER): Payer: Self-pay

## 2018-01-07 ENCOUNTER — Ambulatory Visit (HOSPITAL_BASED_OUTPATIENT_CLINIC_OR_DEPARTMENT_OTHER)
Admission: RE | Admit: 2018-01-07 | Discharge: 2018-01-07 | Disposition: A | Discharge status: Home / Self Care | Payer: Self-pay | Source: Ambulatory Visit | Attending: Orthopedic Surgery | Admitting: Orthopedic Surgery

## 2018-01-07 ENCOUNTER — Encounter (HOSPITAL_BASED_OUTPATIENT_CLINIC_OR_DEPARTMENT_OTHER)
Admission: RE | Disposition: A | Discharge status: Home / Self Care | Payer: Self-pay | Source: Ambulatory Visit | Attending: Orthopedic Surgery

## 2018-01-07 DIAGNOSIS — W19XXXA Unspecified fall, initial encounter: Secondary | ICD-10-CM | POA: Insufficient documentation

## 2018-01-07 DIAGNOSIS — Z8673 Personal history of transient ischemic attack (TIA), and cerebral infarction without residual deficits: Secondary | ICD-10-CM | POA: Insufficient documentation

## 2018-01-07 DIAGNOSIS — K5190 Ulcerative colitis, unspecified, without complications: Secondary | ICD-10-CM | POA: Insufficient documentation

## 2018-01-07 DIAGNOSIS — I10 Essential (primary) hypertension: Secondary | ICD-10-CM | POA: Insufficient documentation

## 2018-01-07 DIAGNOSIS — Z79899 Other long term (current) drug therapy: Secondary | ICD-10-CM | POA: Insufficient documentation

## 2018-01-07 DIAGNOSIS — Z886 Allergy status to analgesic agent status: Secondary | ICD-10-CM | POA: Insufficient documentation

## 2018-01-07 DIAGNOSIS — B1920 Unspecified viral hepatitis C without hepatic coma: Secondary | ICD-10-CM | POA: Insufficient documentation

## 2018-01-07 DIAGNOSIS — F17210 Nicotine dependence, cigarettes, uncomplicated: Secondary | ICD-10-CM | POA: Insufficient documentation

## 2018-01-07 DIAGNOSIS — Z880 Allergy status to penicillin: Secondary | ICD-10-CM | POA: Insufficient documentation

## 2018-01-07 DIAGNOSIS — F329 Major depressive disorder, single episode, unspecified: Secondary | ICD-10-CM | POA: Insufficient documentation

## 2018-01-07 DIAGNOSIS — S52572A Other intraarticular fracture of lower end of left radius, initial encounter for closed fracture: Secondary | ICD-10-CM | POA: Insufficient documentation

## 2018-01-07 HISTORY — PX: OPEN REDUCTION INTERNAL FIXATION (ORIF) DISTAL RADIAL FRACTURE: SHX5989

## 2018-01-07 LAB — RAPID URINE DRUG SCREEN, HOSP PERFORMED
Amphetamines: NOT DETECTED
Barbiturates: NOT DETECTED
Benzodiazepines: NOT DETECTED
Cocaine: POSITIVE — AB
Opiates: POSITIVE — AB
Tetrahydrocannabinol: POSITIVE — AB

## 2018-01-07 SURGERY — OPEN REDUCTION INTERNAL FIXATION (ORIF) DISTAL RADIUS FRACTURE
Anesthesia: Regional | Site: Wrist | Laterality: Left

## 2018-01-07 MED ORDER — MIDAZOLAM HCL 2 MG/2ML IJ SOLN
INTRAMUSCULAR | Status: AC
  Filled 2018-01-07: qty 2

## 2018-01-07 MED ORDER — ONDANSETRON HCL 4 MG/2ML IJ SOLN
INTRAMUSCULAR | Status: AC
  Filled 2018-01-07: qty 2

## 2018-01-07 MED ORDER — ROPIVACAINE HCL 5 MG/ML IJ SOLN
INTRAMUSCULAR | Status: DC | PRN
  Administered 2018-01-07: 30 mL via PERINEURAL

## 2018-01-07 MED ORDER — HYDROCODONE-ACETAMINOPHEN 7.5-325 MG PO TABS: 1.0000 | ORAL_TABLET | Freq: Once | ORAL | Status: DC | PRN

## 2018-01-07 MED ORDER — HYDROMORPHONE HCL 1 MG/ML IJ SOLN
0.2500 mg | INTRAMUSCULAR | Status: DC | PRN
Start: 2018-01-07 — End: 2018-01-07

## 2018-01-07 MED ORDER — OXYCODONE-ACETAMINOPHEN 5-325 MG PO TABS: ORAL_TABLET | ORAL | 0 refills | Status: DC

## 2018-01-07 MED ORDER — SCOPOLAMINE 1 MG/3DAYS TD PT72: 1.0000 | MEDICATED_PATCH | Freq: Once | TRANSDERMAL | Status: DC | PRN

## 2018-01-07 MED ORDER — DEXAMETHASONE SODIUM PHOSPHATE 10 MG/ML IJ SOLN
INTRAMUSCULAR | Status: AC
  Filled 2018-01-07: qty 1

## 2018-01-07 MED ORDER — ACETAMINOPHEN 10 MG/ML IV SOLN: 1000.0000 mg | Freq: Once | INTRAVENOUS | Status: DC | PRN

## 2018-01-07 MED ORDER — PROMETHAZINE HCL 25 MG/ML IJ SOLN: 6.2500 mg | INTRAMUSCULAR | Status: DC | PRN

## 2018-01-07 MED ORDER — LACTATED RINGERS IV SOLN
INTRAVENOUS | Status: DC
  Administered 2018-01-07 (×2): via INTRAVENOUS

## 2018-01-07 MED ORDER — FENTANYL CITRATE (PF) 100 MCG/2ML IJ SOLN
50.0000 ug | INTRAMUSCULAR | Status: DC | PRN
  Administered 2018-01-07: 50 ug via INTRAVENOUS
  Administered 2018-01-07: 100 ug via INTRAVENOUS

## 2018-01-07 MED ORDER — VANCOMYCIN HCL IN DEXTROSE 1-5 GM/200ML-% IV SOLN
INTRAVENOUS | Status: AC
  Filled 2018-01-07: qty 200

## 2018-01-07 MED ORDER — FENTANYL CITRATE (PF) 100 MCG/2ML IJ SOLN
INTRAMUSCULAR | Status: AC
  Filled 2018-01-07: qty 2

## 2018-01-07 MED ORDER — DEXAMETHASONE SODIUM PHOSPHATE 4 MG/ML IJ SOLN
INTRAMUSCULAR | Status: DC | PRN
  Administered 2018-01-07: 10 mg via INTRAVENOUS

## 2018-01-07 MED ORDER — MEPERIDINE HCL 25 MG/ML IJ SOLN: 6.2500 mg | INTRAMUSCULAR | Status: DC | PRN

## 2018-01-07 MED ORDER — PROPOFOL 10 MG/ML IV BOLUS
INTRAVENOUS | Status: DC | PRN
  Administered 2018-01-07: 200 mg via INTRAVENOUS

## 2018-01-07 MED ORDER — ONDANSETRON HCL 4 MG/2ML IJ SOLN
INTRAMUSCULAR | Status: DC | PRN
  Administered 2018-01-07: 4 mg via INTRAVENOUS

## 2018-01-07 MED ORDER — CHLORHEXIDINE GLUCONATE 4 % EX LIQD: 60.0000 mL | Freq: Once | CUTANEOUS | Status: DC

## 2018-01-07 MED ORDER — PHENYLEPHRINE HCL 10 MG/ML IJ SOLN
INTRAMUSCULAR | Status: DC | PRN
  Administered 2018-01-07 (×3): 80 ug via INTRAVENOUS

## 2018-01-07 MED ORDER — LIDOCAINE HCL (CARDIAC) PF 100 MG/5ML IV SOSY
PREFILLED_SYRINGE | INTRAVENOUS | Status: AC
  Filled 2018-01-07: qty 5

## 2018-01-07 MED ORDER — PROPOFOL 10 MG/ML IV BOLUS
INTRAVENOUS | Status: AC
  Filled 2018-01-07: qty 40

## 2018-01-07 MED ORDER — MIDAZOLAM HCL 2 MG/2ML IJ SOLN
1.0000 mg | INTRAMUSCULAR | Status: DC | PRN
  Administered 2018-01-07: 1 mg via INTRAVENOUS
  Administered 2018-01-07: 2 mg via INTRAVENOUS

## 2018-01-07 MED ORDER — VANCOMYCIN HCL IN DEXTROSE 1-5 GM/200ML-% IV SOLN
1000.0000 mg | INTRAVENOUS | Status: AC
  Administered 2018-01-07: 1000 mg via INTRAVENOUS

## 2018-01-07 MED ORDER — LIDOCAINE HCL (CARDIAC) PF 100 MG/5ML IV SOSY
PREFILLED_SYRINGE | INTRAVENOUS | Status: DC | PRN
  Administered 2018-01-07: 100 mg via INTRAVENOUS

## 2018-01-07 SURGICAL SUPPLY — 58 items
BANDAGE ACE 3X5.8 VEL STRL LF (GAUZE/BANDAGES/DRESSINGS) ×3 IMPLANT
BIT DRILL 2.0 LNG QUCK RELEASE (BIT) IMPLANT
BIT DRILL 2.8X5 QR DISP (BIT) ×2 IMPLANT
BLADE SURG 15 STRL LF DISP TIS (BLADE) ×2 IMPLANT
BLADE SURG 15 STRL SS (BLADE) ×6
BNDG CMPR 9X4 STRL LF SNTH (GAUZE/BANDAGES/DRESSINGS) ×1
BNDG ESMARK 4X9 LF (GAUZE/BANDAGES/DRESSINGS) ×3 IMPLANT
BNDG GAUZE ELAST 4 BULKY (GAUZE/BANDAGES/DRESSINGS) ×3 IMPLANT
BNDG PLASTER X FAST 3X3 WHT LF (CAST SUPPLIES) ×30 IMPLANT
BNDG PLSTR 9X3 FST ST WHT (CAST SUPPLIES) ×10
CHLORAPREP W/TINT 26ML (MISCELLANEOUS) ×3 IMPLANT
CORD BIPOLAR FORCEPS 12FT (ELECTRODE) ×3 IMPLANT
COVER BACK TABLE 60X90IN (DRAPES) ×3 IMPLANT
COVER MAYO STAND STRL (DRAPES) ×3 IMPLANT
CUFF TOURNIQUET SINGLE 18IN (TOURNIQUET CUFF) ×2 IMPLANT
CUFF TOURNIQUET SINGLE 24IN (TOURNIQUET CUFF) IMPLANT
DRAPE EXTREMITY T 121X128X90 (DRAPE) ×3 IMPLANT
DRAPE OEC MINIVIEW 54X84 (DRAPES) ×3 IMPLANT
DRAPE SURG 17X23 STRL (DRAPES) ×3 IMPLANT
DRILL 2.0 LNG QUICK RELEASE (BIT) ×3
GAUZE SPONGE 4X4 12PLY STRL (GAUZE/BANDAGES/DRESSINGS) ×3 IMPLANT
GAUZE XEROFORM 1X8 LF (GAUZE/BANDAGES/DRESSINGS) ×3 IMPLANT
GLOVE BIO SURGEON STRL SZ7.5 (GLOVE) ×3 IMPLANT
GLOVE BIOGEL PI IND STRL 8 (GLOVE) ×1 IMPLANT
GLOVE BIOGEL PI IND STRL 8.5 (GLOVE) IMPLANT
GLOVE BIOGEL PI INDICATOR 8 (GLOVE) ×2
GLOVE BIOGEL PI INDICATOR 8.5 (GLOVE) ×2
GLOVE SURG ORTHO 8.0 STRL STRW (GLOVE) ×2 IMPLANT
GOWN STRL REUS W/ TWL LRG LVL3 (GOWN DISPOSABLE) ×1 IMPLANT
GOWN STRL REUS W/TWL LRG LVL3 (GOWN DISPOSABLE) ×3
GOWN STRL REUS W/TWL XL LVL3 (GOWN DISPOSABLE) ×3 IMPLANT
GUIDEWIRE ORTHO 0.054X6 (WIRE) ×6 IMPLANT
NDL HYPO 25X1 1.5 SAFETY (NEEDLE) IMPLANT
NEEDLE HYPO 25X1 1.5 SAFETY (NEEDLE) IMPLANT
NS IRRIG 1000ML POUR BTL (IV SOLUTION) ×3 IMPLANT
PACK BASIN DAY SURGERY FS (CUSTOM PROCEDURE TRAY) ×3 IMPLANT
PAD CAST 3X4 CTTN HI CHSV (CAST SUPPLIES) ×1 IMPLANT
PADDING CAST COTTON 3X4 STRL (CAST SUPPLIES) ×3
PLATE PROX NARROW LEFT (Plate) ×2 IMPLANT
SCREW CORT FT 22X2.3XLCK HEX (Screw) IMPLANT
SCREW CORTICAL LOCKING 2.3X18M (Screw) ×6 IMPLANT
SCREW CORTICAL LOCKING 2.3X20M (Screw) ×6 IMPLANT
SCREW CORTICAL LOCKING 2.3X22M (Screw) ×6 IMPLANT
SCREW FX18X2.3XSMTH LCK NS CRT (Screw) IMPLANT
SCREW FX20X2.3XSMTH LCK NS CRT (Screw) IMPLANT
SCREW HEX 3.5X15 NLCKG STRL (Screw) IMPLANT
SCREW HEX 3.5X15MM (Screw) ×3 IMPLANT
SCREW HEXALOBE NON-LOCK 3.5X14 (Screw) ×4 IMPLANT
SCREW NONLOCK HEX 3.5X12 (Screw) ×2 IMPLANT
SLEEVE SCD COMPRESS KNEE MED (MISCELLANEOUS) ×2 IMPLANT
STOCKINETTE 4X48 STRL (DRAPES) ×3 IMPLANT
SUT ETHILON 4 0 PS 2 18 (SUTURE) ×5 IMPLANT
SUT VICRYL 4-0 PS2 18IN ABS (SUTURE) ×3 IMPLANT
SYR BULB 3OZ (MISCELLANEOUS) ×3 IMPLANT
SYR CONTROL 10ML LL (SYRINGE) IMPLANT
TOWEL OR 17X24 6PK STRL BLUE (TOWEL DISPOSABLE) ×6 IMPLANT
TOWEL OR NON WOVEN STRL DISP B (DISPOSABLE) ×3 IMPLANT
UNDERPAD 30X30 (UNDERPADS AND DIAPERS) ×3 IMPLANT

## 2018-01-07 NOTE — Transfer of Care (Signed)
Immediate Anesthesia Transfer of Care Note  Patient: Holly Weber  Procedure(s) Performed: OPEN REDUCTION INTERNAL FIXATION (ORIF) DISTAL RADIAL FRACTURE (Left Wrist)  Patient Location: PACU  Anesthesia Type:General  Level of Consciousness: awake, alert  and oriented  Airway & Oxygen Therapy: Patient Spontanous Breathing and Patient connected to face mask oxygen  Post-op Assessment: Report given to RN and Post -op Vital signs reviewed and stable  Post vital signs: Reviewed and stable  Last Vitals:  Vitals Value Taken Time  BP 115/77 01/07/2018  3:27 PM  Temp    Pulse 77 01/07/2018  3:28 PM  Resp 13 01/07/2018  3:28 PM  SpO2 99 % 01/07/2018  3:28 PM  Vitals shown include unvalidated device data.  Last Pain: There were no vitals filed for this visit.       Complications: No apparent anesthesia complications

## 2018-01-07 NOTE — Anesthesia Preprocedure Evaluation (Addendum)
Anesthesia Evaluation  Patient identified by MRN, date of birth, ID band Patient awake    Reviewed: Allergy & Precautions, NPO status , Patient's Chart, lab work & pertinent test results  Airway Mallampati: II  TM Distance: >3 FB Neck ROM: Full    Dental no notable dental hx.    Pulmonary Current Smoker,    Pulmonary exam normal breath sounds clear to auscultation       Cardiovascular hypertension, Normal cardiovascular exam Rhythm:Regular Rate:Normal     Neuro/Psych CVA    GI/Hepatic (+) Hepatitis -, A  Endo/Other  negative endocrine ROS  Renal/GU negative Renal ROS     Musculoskeletal   Abdominal   Peds  Hematology   Anesthesia Other Findings   Reproductive/Obstetrics                            Anesthesia Physical Anesthesia Plan  ASA: III  Anesthesia Plan: General and Regional   Post-op Pain Management:    Induction: Intravenous  PONV Risk Score and Plan: Treatment may vary due to age or medical condition  Airway Management Planned: LMA  Additional Equipment:   Intra-op Plan:   Post-operative Plan:   Informed Consent: I have reviewed the patients History and Physical, chart, labs and discussed the procedure including the risks, benefits and alternatives for the proposed anesthesia with the patient or authorized representative who has indicated his/her understanding and acceptance.     Plan Discussed with:   Anesthesia Plan Comments:         Anesthesia Quick Evaluation

## 2018-01-07 NOTE — Discharge Instructions (Addendum)

## 2018-01-07 NOTE — H&P (Signed)
  Holly Weber is an 52 y.o. female.   Chief Complaint: left wrist fracture HPI: 52 yo female states she fell ~ 3 weeks ago injuring left wrist.  Seen in ED where XR revealed distal radius fracture.  Splinted and followed up in office.  She wishes to proceed with operative fixation.  Allergies:  Allergies  Allergen Reactions  . Asa [Aspirin] Other (See Comments)    Ulcerative colitis flares.  . Nsaids Other (See Comments)    Ulcerative colitis flares  . Penicillins Rash and Other (See Comments)    Has patient had a PCN reaction causing immediate rash, facial/tongue/throat swelling, SOB or lightheadedness with hypotension:unsure Has patient had a PCN reaction causing severe rash involving mucus membranes or skin necrosis:unsure Has patient had a PCN reaction that required hospitalization:No Has patient had a PCN reaction occurring within the last 10 years:Yes If all of the above answers are "NO", then may proceed with Cephalosporin use.     Past Medical History:  Diagnosis Date  . Colitis, ulcerative ([place])   . Depression   . Hepatitis C   . Hypertension   . Stroke ([place])   . Thyroid disease     Past Surgical History:  Procedure Laterality Date  . ARM WOUND REPAIR / CLOSURE    . COLONOSCOPY    . TEE WITHOUT CARDIOVERSION N/A 12/17/2016   Procedure: TRANSESOPHAGEAL ECHOCARDIOGRAM (TEE);  Surgeon: Josue Hector, MD;  Location: [facility] ENDOSCOPY;  Service: Cardiovascular;  Laterality: N/A;  . TONSILLECTOMY      Family History: No family history on file.  Social History:   reports that she has been smoking cigarettes.  She has never used smokeless tobacco. She reports that she has current or past drug history. Drug: Cocaine. She reports that she does not drink alcohol.  Medications: No medications prior to admission.    No results found for this or any previous visit (from the past 48 hour(s)).  No results found.   A comprehensive review of systems was negative.  There  were no vitals taken for this visit.  General appearance: alert, cooperative and appears stated age Head: Normocephalic, without obvious abnormality, atraumatic Neck: supple, symmetrical, trachea midline Cardio: regular rate and rhythm Resp: clear to auscultation bilaterally Extremities: Intact sensation and capillary refill all digits.  +epl/fpl/io.  No wounds.  Pulses: 2+ and symmetric Skin: Skin color, texture, turgor normal. No rashes or lesions Neurologic: Grossly normal Incision/Wound: none  Assessment/Plan Left distal radius fracture.  Non operative and operative treatment options were discussed with the patient and patient wishes to proceed with operative treatment. Risks, benefits, and alternatives of surgery were discussed and the patient agrees with the plan of care.   Jaleiah Asay R 01/07/2018, 8:28 AM

## 2018-01-07 NOTE — Anesthesia Postprocedure Evaluation (Signed)
Anesthesia Post Note  Patient: Holly Weber  Procedure(s) Performed: OPEN REDUCTION INTERNAL FIXATION (ORIF) DISTAL RADIAL FRACTURE (Left Wrist)     Patient location during evaluation: PACU Anesthesia Type: Regional and General Level of consciousness: awake and alert Pain management: pain level controlled Vital Signs Assessment: post-procedure vital signs reviewed and stable Respiratory status: spontaneous breathing, nonlabored ventilation, respiratory function stable and patient connected to nasal cannula oxygen Cardiovascular status: blood pressure returned to baseline and stable Postop Assessment: no apparent nausea or vomiting Anesthetic complications: no    Last Vitals:  Vitals:   01/07/18 1600 01/07/18 1615  BP: (!) 155/96 (!) 150/91  Pulse: 80 90  Resp: 17 16  Temp:    SpO2: 96% 96%    Last Pain:  Vitals:   01/07/18 1615  PainSc: 0-No pain                 Barnet Glasgow

## 2018-01-07 NOTE — Anesthesia Procedure Notes (Addendum)
Anesthesia Regional Block: Supraclavicular block   Pre-Anesthetic Checklist: ,, timeout performed, Correct Patient, Correct Site, Correct Laterality, Correct Procedure, Correct Position, site marked, Risks and benefits discussed,  Surgical consent,  Pre-op evaluation,  At surgeon's request and post-op pain management  Laterality: Left  Prep: chloraprep       Needles:  Injection technique: Single-shot  Needle Type: Echogenic Stimulator Needle     Needle Length: 5cm  Needle Gauge: 21     Additional Needles:   Procedures:,,,, ultrasound used (permanent image in chart),,,,  Narrative:  Start time: 01/07/2018 1:18 PM End time: 01/07/2018 1:25 PM Injection made incrementally with aspirations every 5 mL.  Performed by: Personally  Anesthesiologist: Barnet Glasgow, MD

## 2018-01-07 NOTE — Progress Notes (Signed)
Assisted Dr. Houser with left, ultrasound guided, supraclavicular block. Side rails up, monitors on throughout procedure. See vital signs in flow sheet. Tolerated Procedure well. °

## 2018-01-07 NOTE — Op Note (Signed)
I assisted Surgeon(s) and Role:    Leanora Cover, MD - Primary on the Procedure(s): OPEN REDUCTION INTERNAL FIXATION (ORIF) DISTAL RADIAL FRACTURE on 01/07/2018.  I provided assistance on this case as follows:  Setup, approach, debridement, reduction, stabilization a nd fixation of the fracture, closure of the wound and application of the dressings ad splint.  Electronically signed by: Wynonia Sours, MD Date: 01/07/2018 Time: 3:22 PM

## 2018-01-07 NOTE — Brief Op Note (Signed)
01/07/2018  3:21 PM  PATIENT:  Holly Weber  52 y.o. female  PRE-OPERATIVE DIAGNOSIS:  LEFT DISTAL RADIUS FRACTURE  POST-OPERATIVE DIAGNOSIS:  LEFT DISTAL RADIUS FRACTURE  PROCEDURE:  Procedure(s): OPEN REDUCTION INTERNAL FIXATION (ORIF) DISTAL RADIAL FRACTURE (Left)  SURGEON:  Surgeon(s) and Role:    * Leanora Cover, MD - Primary  PHYSICIAN ASSISTANT:   ASSISTANTS: Daryll Brod, MD   ANESTHESIA:   regional and general  EBL:  Minimal  BLOOD ADMINISTERED:none  DRAINS: none   LOCAL MEDICATIONS USED:  NONE  SPECIMEN:  No Specimen  DISPOSITION OF SPECIMEN:  N/A  COUNTS:  YES  TOURNIQUET:  Left arm: 40 minutes at 250 mmHg  DICTATION: .Note written in [place]: Discharge to home after PACU  PATIENT DISPOSITION:  PACU - hemodynamically stable.

## 2018-01-07 NOTE — Op Note (Addendum)
01/07/2018 [facility]  Operative Note  Pre Op Diagnosis: Left comminuted intraarticular distal radius fracture  Post Op Diagnosis: Left comminuted intraarticular distal radius fracture  Procedure:  1.ORIF Left comminuted intraarticular distal radius fracture, 3 intraarticular fragments 2. Left brachioradialis release  Surgeon: Leanora Cover, MD  Assistant: Daryll Brod, MD  Anesthesia: General and Regional  Fluids: Per anesthesia flow sheet  EBL: minimal  Complications: None  Specimen: None  Tourniquet Time: * Missing tourniquet times found for documented tourniquets in log: [id number] *  Disposition: Stable to PACU  INDICATIONS:  Holly Weber is a 52 y.o. female states she fell 3 weeks ago injuring left wrist.  Seen in ED where XR revealed distal radius fracture.  Splinted and followed up in office.  We discussed nonoperative and operative treatment options.  She wished to proceed with operative fixation.  Risks, benefits, and alternatives of surgery were discussed including the risk of blood loss; infection; damage to nerves, vessels, tendons, ligaments, bone; failure of surgery; need for additional surgery; complications with wound healing; continued pain; nonunion; malunion; stiffness.  We also discussed the possible need for bone graft and the benefits and risks including the possibility of disease transmission.  She voiced understanding of these risks and elected to proceed.   OPERATIVE COURSE:  After being identified preoperatively by myself, the patient and I agreed upon the procedure and site of procedure.  Surgical site was marked.  The risks, benefits and alternatives of the surgery were reviewed and she wished to proceed.  Surgical consent had been signed.  She was given IV Ancef as preoperative antibiotic prophylaxis.  She was transferred to the operating room and placed on the operating room table in supine position with the Left upper extremity on an  armboard. General and Regional anesthesia was induced by the anesthesiologist.  The Left upper extremity was prepped and draped in normal sterile orthopedic fashion.  A surgical pause was performed between the surgeons, anesthesia and operating room staff, and all were in agreement as to the patient, procedure and site of procedure.  Tourniquet at the proximal aspect of the extremity was inflated to 250 mmHg after exsanguination of the limb with an Esmarch bandage.  Standard volar Mallie Mussel approach was used.  The bipolar electrocautery was used to obtain hemostasis.  The superficial and deep portions of the FCR tendon sheath were incised, and the FCR and FPL were swept ulnarly to protect the palmar cutaneous branch of the median nerve.  The brachioradialis was released at the radial side of the radius.  The pronator quadratus was released and elevated with the periosteal elevator.  The fracture site was identified and cleared of soft tissue interposition and hematoma.  It was reduced under direct visualization.  There was callus formation.  There was intraarticular extension creating three intraarticular fragments.   An AcuMed volar distal radial locking plate was selected.  It was secured to the bone with the guidepins.  C-arm was used in AP and lateral projections to ensure appropriate reduction and position of the hardware and adjustments made as necessary.  Standard AO drilling and measuring technique was used.  A single screw was placed in the slotted hole in the shaft of the plate.  The distal holes were filled with locking pegs with the exception of the styloid holes, which were filled with locking screws.  The remaining holes in the shaft of the plate were filled with nonlocking screws.  Good purchase was obtained.  C-arm was  used in AP, lateral and oblique projections to ensure appropriate reduction and position of hardware, which was the case.  There was no intra-articular penetration of hardware.  The wound  was copiously irrigated with sterile saline.  Pronator quadratus was repaired back over top of the plate using 4-0 Vicryl suture.  Vicryl suture was placed in the subcutaneous tissues in an inverted interrupted fashion and the skin was closed with 4-0 nylon in a horizontal mattress fashion.  There was good pronation and supination of the wrist without crepitance.  The wound was then dressed with sterile Xeroform, 4x4s, and wrapped with a Kerlix bandage.  A volar splint was placed and wrapped with Kerlix and Ace bandage.  Tourniquet was deflated at 40 minutes.  Fingertips were pink with brisk capillary refill after deflation of the tourniquet.  Operative drapes were broken down.  The patient was awoken from anesthesia safely.  She was transferred back to the stretcher and taken to the PACU in stable condition.  I will see her back in the office in one week for postoperative followup.  I will give her a prescription for percocet 5/325 1-2 tabs PO q6 hours prn pain, dispense #30.    Tennis Must, MD Electronically signed, 01/07/18

## 2018-01-07 NOTE — Op Note (Signed)
Intra-operative fluoroscopic images in the AP, lateral, and oblique views were taken and evaluated by myself.  Reduction and hardware placement were confirmed.  There was no intraarticular penetration of permanent hardware.  

## 2018-01-11 ENCOUNTER — Encounter (HOSPITAL_BASED_OUTPATIENT_CLINIC_OR_DEPARTMENT_OTHER): Payer: Self-pay | Admitting: Orthopedic Surgery

## 2018-10-18 ENCOUNTER — Encounter (HOSPITAL_COMMUNITY): Payer: Self-pay | Admitting: Family Medicine

## 2018-10-18 ENCOUNTER — Inpatient Hospital Stay (HOSPITAL_COMMUNITY)
Admission: EM | Admit: 2018-10-18 | Discharge: 2018-10-21 | DRG: 917 | Payer: Self-pay | Attending: Internal Medicine | Admitting: Internal Medicine

## 2018-10-18 DIAGNOSIS — Z79899 Other long term (current) drug therapy: Secondary | ICD-10-CM

## 2018-10-18 DIAGNOSIS — E876 Hypokalemia: Secondary | ICD-10-CM | POA: Diagnosis present

## 2018-10-18 DIAGNOSIS — T405X1A Poisoning by cocaine, accidental (unintentional), initial encounter: Principal | ICD-10-CM | POA: Diagnosis present

## 2018-10-18 DIAGNOSIS — F1990 Other psychoactive substance use, unspecified, uncomplicated: Secondary | ICD-10-CM | POA: Diagnosis present

## 2018-10-18 DIAGNOSIS — I10 Essential (primary) hypertension: Secondary | ICD-10-CM | POA: Diagnosis present

## 2018-10-18 DIAGNOSIS — Z886 Allergy status to analgesic agent status: Secondary | ICD-10-CM

## 2018-10-18 DIAGNOSIS — L0390 Cellulitis, unspecified: Secondary | ICD-10-CM | POA: Diagnosis present

## 2018-10-18 DIAGNOSIS — Z880 Allergy status to penicillin: Secondary | ICD-10-CM

## 2018-10-18 DIAGNOSIS — G9341 Metabolic encephalopathy: Secondary | ICD-10-CM | POA: Diagnosis present

## 2018-10-18 DIAGNOSIS — F1910 Other psychoactive substance abuse, uncomplicated: Secondary | ICD-10-CM | POA: Diagnosis present

## 2018-10-18 DIAGNOSIS — T405X4A Poisoning by cocaine, undetermined, initial encounter: Secondary | ICD-10-CM

## 2018-10-18 DIAGNOSIS — Z8673 Personal history of transient ischemic attack (TIA), and cerebral infarction without residual deficits: Secondary | ICD-10-CM

## 2018-10-18 DIAGNOSIS — L03119 Cellulitis of unspecified part of limb: Secondary | ICD-10-CM

## 2018-10-18 DIAGNOSIS — F17210 Nicotine dependence, cigarettes, uncomplicated: Secondary | ICD-10-CM | POA: Diagnosis present

## 2018-10-18 DIAGNOSIS — F1010 Alcohol abuse, uncomplicated: Secondary | ICD-10-CM | POA: Diagnosis present

## 2018-10-18 LAB — COMPREHENSIVE METABOLIC PANEL
ALT: 23 U/L (ref 0–44)
AST: 27 U/L (ref 15–41)
Albumin: 3.8 g/dL (ref 3.5–5.0)
Alkaline Phosphatase: 96 U/L (ref 38–126)
Anion gap: 11 (ref 5–15)
BUN: 8 mg/dL (ref 6–20)
CO2: 22 mmol/L (ref 22–32)
Calcium: 8.5 mg/dL — ABNORMAL LOW (ref 8.9–10.3)
Chloride: 102 mmol/L (ref 98–111)
Creatinine, Ser: 0.57 mg/dL (ref 0.44–1.00)
GFR calc Af Amer: >60 mL/min (ref >60)
GFR calc non Af Amer: >60 mL/min (ref >60)
Glucose, Bld: 148 mg/dL — ABNORMAL HIGH (ref 70–99)
POTASSIUM: 3.3 mmol/L — AB (ref 3.5–5.1)
Sodium: 135 mmol/L (ref 135–145)
Total Bilirubin: 0.9 mg/dL (ref 0.3–1.2)
Total Protein: 8.4 g/dL — ABNORMAL HIGH (ref 6.5–8.1)

## 2018-10-18 LAB — I-STAT BETA HCG BLOOD, ED (MC, WL, AP ONLY): I-stat hCG, quantitative: 5.3 m[IU]/mL — ABNORMAL HIGH (ref <5)

## 2018-10-18 LAB — CBC
HCT: 43.7 % (ref 36.0–46.0)
Hemoglobin: 13 g/dL (ref 12.0–15.0)
MCH: 23.6 pg — ABNORMAL LOW (ref 26.0–34.0)
MCHC: 29.7 g/dL — ABNORMAL LOW (ref 30.0–36.0)
MCV: 79.5 fL — ABNORMAL LOW (ref 80.0–100.0)
PLATELETS: 249 10*3/uL (ref 150–400)
RBC: 5.5 MIL/uL — ABNORMAL HIGH (ref 3.87–5.11)
RDW: 15.7 % — ABNORMAL HIGH (ref 11.5–15.5)
WBC: 10 10*3/uL (ref 4.0–10.5)
nRBC: 0 % (ref 0.0–0.2)

## 2018-10-18 LAB — RAPID URINE DRUG SCREEN, HOSP PERFORMED
Amphetamines: NOT DETECTED
Barbiturates: NOT DETECTED
Benzodiazepines: NOT DETECTED
Cocaine: POSITIVE — AB
OPIATES: NOT DETECTED
Tetrahydrocannabinol: POSITIVE — AB

## 2018-10-18 LAB — ETHANOL

## 2018-10-18 LAB — ACETAMINOPHEN LEVEL: Acetaminophen (Tylenol), Serum: <10 ug/mL — ABNORMAL LOW (ref 10–30)

## 2018-10-18 LAB — SALICYLATE LEVEL

## 2018-10-18 LAB — MAGNESIUM: MAGNESIUM: 2.2 mg/dL (ref 1.7–2.4)

## 2018-10-18 LAB — POC URINE PREG, ED: Preg Test, Ur: NEGATIVE

## 2018-10-18 MED ORDER — SODIUM CHLORIDE 0.9 % IV BOLUS
500.0000 mL | Freq: Once | INTRAVENOUS | Status: AC
  Administered 2018-10-18: 500 mL via INTRAVENOUS

## 2018-10-18 MED ORDER — LORAZEPAM 2 MG/ML IJ SOLN
INTRAMUSCULAR | Status: AC
  Filled 2018-10-18: qty 1

## 2018-10-18 MED ORDER — HYDRALAZINE HCL 20 MG/ML IJ SOLN
5.0000 mg | Freq: Once | INTRAMUSCULAR | Status: AC
  Administered 2018-10-18: 5 mg via INTRAVENOUS
  Filled 2018-10-18: qty 1

## 2018-10-18 MED ORDER — SODIUM CHLORIDE 0.9 % IV BOLUS
1000.0000 mL | Freq: Once | INTRAVENOUS | Status: AC
  Administered 2018-10-18: 1000 mL via INTRAVENOUS

## 2018-10-18 MED ORDER — POTASSIUM CHLORIDE CRYS ER 20 MEQ PO TBCR
40.0000 meq | EXTENDED_RELEASE_TABLET | Freq: Once | ORAL | Status: AC
  Administered 2018-10-18: 40 meq via ORAL
  Filled 2018-10-18: qty 2

## 2018-10-18 MED ORDER — CHARCOAL ACTIVATED PO LIQD
75.0000 g | Freq: Once | ORAL | Status: DC
  Filled 2018-10-18: qty 480

## 2018-10-18 NOTE — ED Notes (Signed)
Pt changed into scrubs.

## 2018-10-18 NOTE — ED Triage Notes (Signed)
Pt BIBA and [employer]. Pt initially called out because she was handcuffed and had shoulder pain. Pt may have swallowed crack- pt states she swallowed an 8 ball (~3.5 grams). Unknown if this actually occurred. Pt also c/o abscesses all over her body.  Pt

## 2018-10-18 NOTE — ED Notes (Signed)
Bed: [facility] Expected date:  Expected time:  Means of arrival:  Comments: 

## 2018-10-18 NOTE — H&P (Signed)
Holly Weber:[id number] DOB: 07-23-1966 DOA: 10/18/2018     PCP: Patient, No Pcp Per   Outpatient Specialists:  Patient arrived to ER on 10/18/18 at 1421  Patient coming from: home    Chief Complaint:  Chief Complaint  Patient presents with  . Drug Overdose  . Shoulder Pain    right  . Abscess    HPI: [name] is a 53 y.o. female with medical history significant of UC, depression, HTN, CVA,    Presented with   shoulder pain but later stated that she might have swallowed crack while being detained states that she swallowed in a ball which is around 3.5 g She stated it was not in the bed she uses crack every day and usually uses about 3 g throughout the day she swallows so the police would not get it.  Patient endorsed feeling shaky and sweaty after ingesting the drugs.  Denies chest pain or shortness of breath endorses smoking marijuana earlier reports daily alcohol use.  Drinks about 12 pack a day.  Admits to IV drug use has has multiple abscesses on her body for past few days. When police arrested her she reported right shoulder pain. Able to provide detailed history as patient currently sedated with Ativan  While in ER: Control has been contacted recommendations to administer charcoal but patient has refused. At this point recommending monitoring and supportive treatment with Ativan as needed and IV fluid rehydration monitor and electrolytes.  Patient can be medically clear when does not require Ativan for 12 hours.  The following Work up has been ordered so far:  Orders Placed This Encounter  Procedures  . Comprehensive metabolic panel  . Ethanol  . Salicylate level  . Acetaminophen level  . cbc  . Rapid urine drug screen (hospital performed)  . Magnesium  . Cardiac monitoring  . Flight risk  . Saline Lock IV, Maintain IV access  . Consult to hospitalist  . Pulse oximetry, continuous  . CBG monitoring, ED  . I-Stat beta hCG blood, ED  . POC Urine  Pregnancy, ED (do NOT order at [facility])  . I-Stat Troponin, ED (not at [facility])  . ED EKG  . EKG 12-Lead  . ED EKG  . EKG 12-Lead  . EKG 12-Lead  . EKG 12-Lead  . Precautions Type: Suicide; Level of Care as indicated by C-SSRS Risk Category: At Risk for suicide but unable or unwilling to respond: Initiate Suicide Precautions to include 1:1 monitoring      Following Medications were ordered in ER: Medications  charcoal activated (NO SORBITOL) (ACTIDOSE-AQUA) suspension 75 g (75 g Oral Refused 10/18/18 2158)  sodium chloride 0.9 % bolus 500 mL (500 mLs Intravenous New Bag/Given 10/18/18 2207)  LORazepam (ATIVAN) 2 MG/ML injection (has no administration in time range)  potassium chloride SA (K-DUR,KLOR-CON) CR tablet 40 mEq (40 mEq Oral Given 10/18/18 1716)  sodium chloride 0.9 % bolus 1,000 mL (0 mLs Intravenous Stopped 10/18/18 1814)  hydrALAZINE (APRESOLINE) injection 5 mg (5 mg Intravenous Given 10/18/18 1831)    Significant initial  Findings: Abnormal Labs Reviewed  COMPREHENSIVE METABOLIC PANEL - Abnormal; Notable for the following components:      Result Value   Potassium 3.3 (*)    Glucose, Bld 148 (*)    Calcium 8.5 (*)    Total Protein 8.4 (*)    All other components within normal limits  ACETAMINOPHEN LEVEL - Abnormal; Notable for the following components:   Acetaminophen (Tylenol),  Serum <10 (*)    All other components within normal limits  CBC - Abnormal; Notable for the following components:   RBC 5.50 (*)    MCV 79.5 (*)    MCH 23.6 (*)    MCHC 29.7 (*)    RDW 15.7 (*)    All other components within normal limits  RAPID URINE DRUG SCREEN, HOSP PERFORMED - Abnormal; Notable for the following components:   Cocaine POSITIVE (*)    Tetrahydrocannabinol POSITIVE (*)    All other components within normal limits  I-STAT BETA HCG BLOOD, ED ([facility], [facility], [facility] ONLY) - Abnormal; Notable for the following components:   I-stat hCG, quantitative 5.3 (*)    All other components within normal  limits     Lactic Acid, Venous    Component Value Date/Time   LATICACIDVEN 0.7 12/14/2016 0117    Na 135 K 3.3  Cr   stable,   Lab Results  Component Value Date   CREATININE 0.57 10/18/2018   CREATININE 0.72 12/30/2016   CREATININE 0.68 12/27/2016      HG/HCT  Stable     Component Value Date/Time   HGB 13.0 10/18/2018 1513   HGB 14.4 01/01/2017 1049   HCT 43.7 10/18/2018 1513   HCT 42.3 01/01/2017 1049       ECG:  Personally reviewed by me showing: HR : 92 Rhythm:  NSR,     no evidence of ischemic changes QTC 473      ED Triage Vitals  Enc Vitals Group     BP 10/18/18 1439 (!) 187/112     Pulse Rate 10/18/18 1439 98     Resp 10/18/18 1439 18     Temp 10/18/18 1439 98.4 F (36.9 C)     Temp Source 10/18/18 1439 Oral     SpO2 10/18/18 1439 95 %     Weight 10/18/18 1440 189 lb 6 oz (85.9 kg)     Height 10/18/18 1440 5' 5.5" (1.664 m)     Head Circumference --      Peak Flow --      Pain Score 10/18/18 1439 8     Pain Loc --      Pain Edu? --      Excl. in [place]? --   TMAX(24)@       Latest  Blood pressure (!) 176/95, pulse 97, temperature 98.4 F (36.9 C), temperature source Oral, resp. rate (!) 26, height 5' 5.5" (1.664 m), weight 85.9 kg, SpO2 96 %.      Hospitalist was called for admission for  Cocaine overdose with self-described intention of avoiding jail   Review of Systems:    Pertinent positives include: anxiety  change in mood or affect.  Constitutional:  No weight loss, night sweats, Fevers, chills, fatigue, weight loss  HEENT:  No headaches, Difficulty swallowing,Tooth/dental problems,Sore throat,  No sneezing, itching, ear ache, nasal congestion, post nasal drip,  Cardio-vascular:  No chest pain, Orthopnea, PND, anasarca, dizziness, palpitations.no Bilateral lower extremity swelling  GI:  No heartburn, indigestion, abdominal pain, nausea, vomiting, diarrhea, change in bowel habits, loss of appetite, melena, blood in stool,  hematemesis Resp:  no shortness of breath at rest. No dyspnea on exertion, No excess mucus, no productive cough, No non-productive cough, No coughing up of blood.No change in color of mucus.No wheezing. Skin:  no rash or lesions. No jaundice GU:  no dysuria, change in color of urine, no urgency or frequency. No straining to urinate.  No flank  pain.  Musculoskeletal:  No joint pain or no joint swelling. No decreased range of motion. No back pain.  Psych:  No No depression or anxiety.   Neuro: no localizing neurological complaints, no tingling, no weakness, no double vision, no gait abnormality, no slurred speech,    All systems reviewed and apart from [place] all are negative  Past Medical History:   Past Medical History:  Diagnosis Date  . Colitis, ulcerative ([place])   . Depression   . Hepatitis C   . Hypertension   . Stroke ([place])   . Thyroid disease       Past Surgical History:  Procedure Laterality Date  . ARM WOUND REPAIR / CLOSURE    . COLONOSCOPY    . OPEN REDUCTION INTERNAL FIXATION (ORIF) DISTAL RADIAL FRACTURE Left 01/07/2018   Procedure: OPEN REDUCTION INTERNAL FIXATION (ORIF) DISTAL RADIAL FRACTURE;  Surgeon: Leanora Cover, MD;  Location: [place];  Service: Orthopedics;  Laterality: Left;  . TEE WITHOUT CARDIOVERSION N/A 12/17/2016   Procedure: TRANSESOPHAGEAL ECHOCARDIOGRAM (TEE);  Surgeon: Josue Hector, MD;  Location: [facility] ENDOSCOPY;  Service: Cardiovascular;  Laterality: N/A;  . TONSILLECTOMY      Social History:  Ambulatory  independently      reports that she has been smoking cigarettes. She has been smoking about 1.50 packs per day. She has never used smokeless tobacco. She reports current alcohol use. She reports current drug use. Drugs: Cocaine and Marijuana.     Family History: Patient is not alert oriented unable to obtain Allergies: Allergies  Allergen Reactions  . Asa [Aspirin] Other (See Comments)    Ulcerative colitis flares.  .  Nsaids Other (See Comments)    Ulcerative colitis flares  . Penicillins Rash and Other (See Comments)    Has patient had a PCN reaction causing immediate rash, facial/tongue/throat swelling, SOB or lightheadedness with hypotension:unsure Has patient had a PCN reaction causing severe rash involving mucus membranes or skin necrosis:unsure Has patient had a PCN reaction that required hospitalization:No Has patient had a PCN reaction occurring within the last 10 years:Yes If all of the above answers are "NO", then may proceed with Cephalosporin use.      Prior to Admission medications   Medication Sig Start Date End Date Taking? Authorizing Provider  fluconazole (DIFLUCAN) 150 MG tablet Take 1 tablet (150 mg total) daily by mouth. Patient not taking: Reported on 10/18/2018 08/01/17   Jacqualine Mau, NP  gabapentin (NEURONTIN) 300 MG capsule Take 1 capsule (300 mg total) by mouth 3 (three) times daily. Patient not taking: Reported on 01/01/2017 12/30/16 01/29/17  Reyne Dumas, MD  lidocaine (LIDODERM) 5 % Place 2 patches onto the skin daily. Remove & Discard patch within 12 hours or as directed by MD Patient not taking: Reported on 10/18/2018 12/30/16   Reyne Dumas, MD  methocarbamol (ROBAXIN) 500 MG tablet Take 1 tablet (500 mg total) by mouth every 6 (six) hours as needed for muscle spasms. Patient not taking: Reported on 10/18/2018 12/30/16   Reyne Dumas, MD  metroNIDAZOLE (FLAGYL) 500 MG tablet Take 1 tablet (500 mg total) 2 (two) times daily by mouth. Patient not taking: Reported on 10/18/2018 08/01/17   Jacqualine Mau, NP  nicotine (NICODERM CQ - DOSED IN MG/24 HOURS) 21 mg/24hr patch Place 1 patch (21 mg total) onto the skin daily. Patient not taking: Reported on 10/18/2018 12/31/16   Reyne Dumas, MD  oxyCODONE-acetaminophen (PERCOCET) 5-325 MG tablet 1-2 tabs PO q6 hours prn pain  Patient not taking: Reported on 10/18/2018 01/07/18   Leanora Cover, MD   Physical Exam: Blood  pressure (!) 176/95, pulse 97, temperature 98.4 F (36.9 C), temperature source Oral, resp. rate (!) 26, height 5' 5.5" (1.664 m), weight 85.9 kg, SpO2 96 %. 1. General:  in No  Acute distress   Chronically ill -appearing 2. Psychological: medically sedated 3. Head/ENT:     Dry Mucous Membranes                          Head Non traumatic, neck supple                            Poor Dentition 4. SKIN:  decreased Skin turgor,  Skin clean Dry multiple scarring areas of abscesses and scabbing consistent with "skin popping"      5. Heart: Regular rate and rhythm no  Murmur, no Rub or gallop 6. Lungs:  Clear to auscultation bilaterally, no wheezes or crackles   7. Abdomen: Soft, non-tender, Non distended obese   bowel sounds present 8. Lower extremities: no clubbing, cyanosis, no edema 9. Neurologically Grossly intact, moving all 4 extremities equally  10. MSK: Normal range of motion   LABS:     Recent Labs  Lab 10/18/18 1513  WBC 10.0  HGB 13.0  HCT 43.7  MCV 79.5*  PLT 350   Basic Metabolic Panel: Recent Labs  Lab 10/18/18 1513 10/18/18 1711  NA 135  --   K 3.3*  --   CL 102  --   CO2 22  --   GLUCOSE 148*  --   BUN 8  --   CREATININE 0.57  --   CALCIUM 8.5*  --   MG  --  2.2      Recent Labs  Lab 10/18/18 1513  AST 27  ALT 23  ALKPHOS 96  BILITOT 0.9  PROT 8.4*  ALBUMIN 3.8   No results for input(s): LIPASE, AMYLASE in the last 168 hours. No results for input(s): AMMONIA in the last 168 hours.    HbA1C: No results for input(s): HGBA1C in the last 72 hours. CBG: No results for input(s): GLUCAP in the last 168 hours.    Urine analysis:    Component Value Date/Time   COLORURINE YELLOW 12/13/2016 0835   APPEARANCEUR HAZY (A) 12/13/2016 0835   LABSPEC 1.018 12/13/2016 0835   PHURINE 7.0 12/13/2016 0835   GLUCOSEU NEGATIVE 12/13/2016 0835   HGBUR NEGATIVE 12/13/2016 0835   BILIRUBINUR NEGATIVE 12/13/2016 0835   KETONESUR 5 (A) 12/13/2016 0835    PROTEINUR NEGATIVE 12/13/2016 0835   NITRITE NEGATIVE 12/13/2016 0835   LEUKOCYTESUR NEGATIVE 12/13/2016 0835       Cultures:    Component Value Date/Time   SDES VAGINA 08/01/2017 1605   SPECREQUEST Normal 08/01/2017 1605   CULT  08/01/2017 1605    FEW STAPHYLOCOCCUS AUREUS WITHIN NORMAL VAGINAL FLORA    REPTSTATUS 08/04/2017 FINAL 08/01/2017 1605     Radiological Exams on Admission: No results found.  Chart has been reviewed    Assessment/Plan   53 y.o. female with medical history significant of UC, depression, HTN, CVA, Admitted for Cocaine overdose   Present on Admission: . Cocaine overdose ([place]) -patient did not endorse suicidal ideation apparently this was an attempt to avoid jail time. Monitor in stepdown rehydrate administer Ativan as needed.  Will be medically cleared for discharge when does not  require Ativan for 12 hours. . Cellulitis-mild secondary to skin popping treat with Keflex and monitor . IV drug user-patient is skin popping administering crack cocaine under the skin resulting in multiple areas of localized skin reactions or cellulitis.  If develops febrile illness will have echogram to further evaluate . Polysubstance abuse ([place]) -social work consult . Hypokalemia - - will replace and repeat in AM,  check magnesium level and replace as needed  History of alcohol abuse order CIWA protocol  Other plan as per orders.  DVT prophylaxis:  SCD      Code Status:  FULL CODE   Family Communication:   Family not at  Bedside    Disposition Plan:   Patient is in jail hold meaning at the time of discharge is to be  discharged to police care                                       Social Work  consulted                                       Consults called: none    Admission status:   Obs    Level of care      SDU tele indefinitely please discontinue once patient no longer qualifies      Arlisha Patalano 10/18/2018, 11:29 PM    [facility]      after 2 AM please page floor coverage PA If 7AM-7PM, please contact the day team taking care of the patient using [email or web address]

## 2018-10-18 NOTE — ED Notes (Signed)
ED Provider at bedside. 

## 2018-10-18 NOTE — ED Notes (Signed)
Meal tray provided for patient at this time.

## 2018-10-18 NOTE — ED Provider Notes (Signed)
[facility] DEPT Provider Note   CSN: [id number] Arrival date & time: 10/18/18  1421     History   Chief Complaint Chief Complaint  Patient presents with  . Drug Overdose  . Shoulder Pain    right  . Abscess    HPI Holly Weber is a 53 y.o. female.  HPI   Pt is a 53 y/o female with a h/o UC, depression, HTN, CVA, who presents to the ED today for multiple complaints.  Pt states that she swallowed 3 grams of crack today so that the police wouldn't take it from her. States it was not in a bag. States she did this 1.5 hours ago. States that she uses crack every day and usually uses about 3 grams throughout the day. Pt states she feels shaky and was sweating after ingesting the drugs. She denies chest pain, sob, abd pain, nvd, or other symptoms. States she also smoked marijuana this morning. Denies ETOH use today. She does drink ETOH daily. States she drinks about a 12 pack of beer daily. She admits to IVDU.  Patient denies that she ingested the illicit substances to harm herself.  Specifically denies suicidal ideations.  She also c/o multiple abscesses to her body that have been present for the last few days. She also complaints that her shoulder began hurting of the police got custody of her.   Collateral history was obtained from [employer] and [employer].  They state that they were picking up the patient for multiple warrants and were about to take her into custody.  She then complained of right shoulder pain so EMS was contacted per protocol.  Upon EMS arrival patient then reported that she ingested the crack.  She was then brought to the emergency department.  The ingestion was not witnessed by anyone who was present with her today.  Past Medical History:  Diagnosis Date  . Colitis, ulcerative ([place])   . Depression   . Hepatitis C   . Hypertension   . Stroke ([place])   . Thyroid disease     Patient Active Problem List   Diagnosis Date  Noted  . Cocaine overdose ([place]) 10/18/2018  . Cellulitis 10/18/2018  . Hypokalemia 10/18/2018  . MSSA (methicillin susceptible Staphylococcus aureus) infection 12/27/2016  . Bacteremia 12/22/2016  . Polysubstance abuse ([place]) 12/22/2016  . Osteomyelitis ([place]) 12/22/2016  . Hyponatremia 12/22/2016  . Osteomyelitis of left hip ([place])   . Sacroiliitis ([place])   . Staphylococcus aureus bacteremia 12/15/2016  . Pneumonia 12/15/2016  . Hyperglycemia 12/15/2016  . Normocytic anemia 12/15/2016  . IV drug user 12/15/2016  . Acute left-sided low back pain with left-sided sciatica 12/13/2016  . Cocaine abuse ([place]) 12/13/2016  . Tobacco abuse 12/13/2016  . HTN (hypertension) 12/13/2016    Past Surgical History:  Procedure Laterality Date  . ARM WOUND REPAIR / CLOSURE    . COLONOSCOPY    . OPEN REDUCTION INTERNAL FIXATION (ORIF) DISTAL RADIAL FRACTURE Left 01/07/2018   Procedure: OPEN REDUCTION INTERNAL FIXATION (ORIF) DISTAL RADIAL FRACTURE;  Surgeon: Leanora Cover, MD;  Location: [facility];  Service: Orthopedics;  Laterality: Left;  . TEE WITHOUT CARDIOVERSION N/A 12/17/2016   Procedure: TRANSESOPHAGEAL ECHOCARDIOGRAM (TEE);  Surgeon: Josue Hector, MD;  Location: [facility] ENDOSCOPY;  Service: Cardiovascular;  Laterality: N/A;  . TONSILLECTOMY       OB History   No obstetric history on file.      Home Medications    Prior to  Admission medications   Medication Sig Start Date End Date Taking? Authorizing Provider  fluconazole (DIFLUCAN) 150 MG tablet Take 1 tablet (150 mg total) daily by mouth. Patient not taking: Reported on 10/18/2018 08/01/17   Jacqualine Mau, NP  gabapentin (NEURONTIN) 300 MG capsule Take 1 capsule (300 mg total) by mouth 3 (three) times daily. Patient not taking: Reported on 01/01/2017 12/30/16 01/29/17  Reyne Dumas, MD  lidocaine (LIDODERM) 5 % Place 2 patches onto the skin daily. Remove & Discard patch within 12 hours or as directed by MD Patient  not taking: Reported on 10/18/2018 12/30/16   Reyne Dumas, MD  methocarbamol (ROBAXIN) 500 MG tablet Take 1 tablet (500 mg total) by mouth every 6 (six) hours as needed for muscle spasms. Patient not taking: Reported on 10/18/2018 12/30/16   Reyne Dumas, MD  metroNIDAZOLE (FLAGYL) 500 MG tablet Take 1 tablet (500 mg total) 2 (two) times daily by mouth. Patient not taking: Reported on 10/18/2018 08/01/17   Jacqualine Mau, NP  nicotine (NICODERM CQ - DOSED IN MG/24 HOURS) 21 mg/24hr patch Place 1 patch (21 mg total) onto the skin daily. Patient not taking: Reported on 10/18/2018 12/31/16   Reyne Dumas, MD  oxyCODONE-acetaminophen (PERCOCET) 5-325 MG tablet 1-2 tabs PO q6 hours prn pain Patient not taking: Reported on 10/18/2018 01/07/18   Leanora Cover, MD    Family History History reviewed. No pertinent family history.  Social History Social History   Tobacco Use  . Smoking status: Current Every Day Smoker    Packs/day: 1.50    Types: Cigarettes  . Smokeless tobacco: Never Used  Substance Use Topics  . Alcohol use: Yes    Comment: Daily   . Drug use: Yes    Types: Cocaine, Marijuana    Comment: Daily on both     Allergies   Asa [aspirin]; Nsaids; and Penicillins   Review of Systems Review of Systems  Constitutional: Positive for diaphoresis. Negative for fever.       Shakiness  HENT: Negative for congestion.   Eyes: Negative for visual disturbance.  Respiratory: Negative for shortness of breath.   Cardiovascular: Negative for chest pain.  Gastrointestinal: Negative for abdominal pain, constipation, diarrhea, nausea and vomiting.  Genitourinary: Negative for pelvic pain.  Musculoskeletal: Negative for back pain.       Right shoulder pain  Skin: Positive for wound.  Neurological: Negative for headaches.     Physical Exam Updated Vital Signs BP (!) 174/92   Pulse 97   Temp 98.3 F (36.8 C) (Oral)   Resp (!) 29   Ht 5' 5.5" (1.664 m)   Wt 85.9 kg   SpO2 92%    BMI 31.03 kg/m   Physical Exam Vitals signs and nursing note reviewed.  Constitutional:      General: She is not in acute distress.    Appearance: She is well-developed. She is not ill-appearing or toxic-appearing.  HENT:     Head: Normocephalic and atraumatic.  Eyes:     Conjunctiva/sclera: Conjunctivae normal.  Neck:     Musculoskeletal: Neck supple.  Cardiovascular:     Rate and Rhythm: Normal rate and regular rhythm.     Heart sounds: No murmur.  Pulmonary:     Effort: Pulmonary effort is normal. No respiratory distress.     Breath sounds: Normal breath sounds. No stridor. No wheezing or rhonchi.  Abdominal:     General: Bowel sounds are normal.     Palpations: Abdomen is soft.  Tenderness: There is no abdominal tenderness. There is no guarding or rebound.  Skin:    General: Skin is warm and dry.     Comments: Multiple scabs to the abd, BUE and BLE. Multiple areas of erythema to the RLE consistent with cellulitis.  Neurological:     Mental Status: She is alert.     Comments: Alert, answers questions appropriately  Psychiatric:        Mood and Affect: Mood normal.      ED Treatments / Results  Labs (all labs ordered are listed, but only abnormal results are displayed) Labs Reviewed  COMPREHENSIVE METABOLIC PANEL - Abnormal; Notable for the following components:      Result Value   Potassium 3.3 (*)    Glucose, Bld 148 (*)    Calcium 8.5 (*)    Total Protein 8.4 (*)    All other components within normal limits  ACETAMINOPHEN LEVEL - Abnormal; Notable for the following components:   Acetaminophen (Tylenol), Serum <10 (*)    All other components within normal limits  CBC - Abnormal; Notable for the following components:   RBC 5.50 (*)    MCV 79.5 (*)    MCH 23.6 (*)    MCHC 29.7 (*)    RDW 15.7 (*)    All other components within normal limits  RAPID URINE DRUG SCREEN, HOSP PERFORMED - Abnormal; Notable for the following components:   Cocaine POSITIVE (*)     Tetrahydrocannabinol POSITIVE (*)    All other components within normal limits  I-STAT BETA HCG BLOOD, ED ([facility], [facility], [facility] ONLY) - Abnormal; Notable for the following components:   I-stat hCG, quantitative 5.3 (*)    All other components within normal limits  MRSA PCR SCREENING  ETHANOL  SALICYLATE LEVEL  MAGNESIUM  MAGNESIUM  PHOSPHORUS  POC URINE PREG, ED  CBG MONITORING, ED  I-STAT TROPONIN, ED    EKG None  Radiology No results found.  Procedures Procedures (including critical care time) CRITICAL CARE Performed by: Rodney Booze   Total critical care time: 40 minutes  Critical care time was exclusive of separately billable procedures and treating other patients.  Critical care was necessary to treat or prevent imminent or life-threatening deterioration.  Critical care was time spent personally by me on the following activities: development of treatment plan with patient and/or surrogate as well as nursing, discussions with consultants, evaluation of patient's response to treatment, examination of patient, obtaining history from patient or surrogate, ordering and performing treatments and interventions, ordering and review of laboratory studies, ordering and review of radiographic studies, pulse oximetry and re-evaluation of patient's condition.  Medications Ordered in ED Medications  charcoal activated (NO SORBITOL) (ACTIDOSE-AQUA) suspension 75 g (75 g Oral Refused 10/18/18 2158)  LORazepam (ATIVAN) 2 MG/ML injection (has no administration in time range)  potassium chloride SA (K-DUR,KLOR-CON) CR tablet 40 mEq (40 mEq Oral Given 10/18/18 1716)  sodium chloride 0.9 % bolus 1,000 mL (0 mLs Intravenous Stopped 10/18/18 1814)  hydrALAZINE (APRESOLINE) injection 5 mg (5 mg Intravenous Given 10/18/18 1831)  sodium chloride 0.9 % bolus 500 mL (0 mLs Intravenous Stopped 10/18/18 2246)     Initial Impression / Assessment and Plan / ED Course  I have reviewed the triage vital  signs and the nursing notes.  Pertinent labs & imaging results that were available during my care of the patient were reviewed by me and considered in my medical decision making (see chart for details).  Final Clinical Impressions(s) / ED Diagnoses   Final diagnoses:  Overdose of cocaine, undetermined intent, initial encounter [facility])   Patient presenting for evaluation after ingesting 3 g of crack cocaine just prior to arrival.  At this time symptoms are nonspecific and she only complains of diaphoresis and shakiness.  She is hypertensive, but otherwise her vital signs are reassuring.  She does have a history of hypertension.  She does not have chest pain shortness of breath or headaches initial evaluation.  She denies the ingestion was due to an intent to self-harm.  She states that she ingested substances to avoid being caught by police.  4:35 PM After initial evaluation, please control was consulted.  Discussed case with Patty, who advised to obtain potassium and magnesium levels and optimize them.  Advised to order EKG and repeat this EKG in several hours.  Advised benzos and fluids for agitation and other symptoms.  Advised 8-hour observation.  CBC reassuring.  CMP with hypokalemia, supplemented.  Mg normal Beta hcg slightly positive, u preg negative. uds positive for maijuana and cocaine as expected.  etoh negative Acetaminophen and salicylate negative  Initial ekg with hr 97, lae, incomplete RBBB and borderline prolonged qt with qtc of 501. Repeat ekg with sinus tachycardia, hr 102, rsr' in v1 or v2, right VCD or RVH.  5:45 PM Reevaluated patient.  She is sitting in bed meal tray in no distress.  8:20 PM On reevaluation patient stating that she does not feel well.  She does look slightly diaphoretic at this time appears uncomfortable and slightly agitated.  She now admits that the drugs were in a bag when she ingested them.  8:30 PM Contacted poison control again with updated  history. They state that since pt ingested drugs in a bag that her obs period will need to be for at least 12 hours. They further recommended that activated charcoal without sorbitol should be given with dosing recommendation of 1g/kg with max of 75g. Patient will need to be observed until she is not requiring benzos for a period of at least 12 hours and has stable vital signs. They recommended checking a rectal exam to assess if bag is near being passed. They stated that pt will not need to pass the bag prior to being medically cleared. If she is cleared medically and discharged in to police custody, they will need to be educated on monitoring stools.   Reassessed pt. Ativan was given and pt is now sleepy. She opens eyes to sternal rub. Attempted to complete rectal exam but pt cannot currently consent.   Will plan for admission for further observation and treatment.   Case discussed with Dr. Roel Cluck who accepts patient for admission.   ED Discharge Orders    None       Rodney Booze, [place] 10/19/18 0044    Julianne Rice, MD 10/25/18 [phone]

## 2018-10-18 NOTE — ED Notes (Signed)
I stat HCG given to RN and MD.

## 2018-10-19 ENCOUNTER — Other Ambulatory Visit: Payer: Self-pay

## 2018-10-19 DIAGNOSIS — T405X1A Poisoning by cocaine, accidental (unintentional), initial encounter: Secondary | ICD-10-CM

## 2018-10-19 DIAGNOSIS — T405X4D Poisoning by cocaine, undetermined, subsequent encounter: Secondary | ICD-10-CM

## 2018-10-19 LAB — CBC
HCT: 41.7 % (ref 36.0–46.0)
Hemoglobin: 12.5 g/dL (ref 12.0–15.0)
MCH: 23.3 pg — ABNORMAL LOW (ref 26.0–34.0)
MCHC: 30 g/dL (ref 30.0–36.0)
MCV: 77.8 fL — ABNORMAL LOW (ref 80.0–100.0)
NRBC: 0 % (ref 0.0–0.2)
Platelets: 279 10*3/uL (ref 150–400)
RBC: 5.36 MIL/uL — ABNORMAL HIGH (ref 3.87–5.11)
RDW: 15.9 % — ABNORMAL HIGH (ref 11.5–15.5)
WBC: 8.9 10*3/uL (ref 4.0–10.5)

## 2018-10-19 LAB — MRSA PCR SCREENING: MRSA by PCR: POSITIVE — AB

## 2018-10-19 LAB — COMPREHENSIVE METABOLIC PANEL
ALT: 22 U/L (ref 0–44)
AST: 25 U/L (ref 15–41)
Albumin: 3.5 g/dL (ref 3.5–5.0)
Alkaline Phosphatase: 91 U/L (ref 38–126)
Anion gap: 7 (ref 5–15)
BILIRUBIN TOTAL: 1.1 mg/dL (ref 0.3–1.2)
BUN: 6 mg/dL (ref 6–20)
CO2: 24 mmol/L (ref 22–32)
Calcium: 8.8 mg/dL — ABNORMAL LOW (ref 8.9–10.3)
Chloride: 107 mmol/L (ref 98–111)
Creatinine, Ser: 0.45 mg/dL (ref 0.44–1.00)
GFR calc non Af Amer: >60 mL/min (ref >60)
Glucose, Bld: 118 mg/dL — ABNORMAL HIGH (ref 70–99)
POTASSIUM: 3.4 mmol/L — AB (ref 3.5–5.1)
Sodium: 138 mmol/L (ref 135–145)
Total Protein: 7.8 g/dL (ref 6.5–8.1)

## 2018-10-19 LAB — PHOSPHORUS: Phosphorus: 3.1 mg/dL (ref 2.5–4.6)

## 2018-10-19 LAB — I-STAT TROPONIN, ED: Troponin i, poc: 0 ng/mL (ref 0.00–0.08)

## 2018-10-19 LAB — TSH: TSH: 0.996 u[IU]/mL (ref 0.350–4.500)

## 2018-10-19 LAB — MAGNESIUM: Magnesium: 2.1 mg/dL (ref 1.7–2.4)

## 2018-10-19 MED ORDER — NAPROXEN 500 MG PO TABS
500.0000 mg | ORAL_TABLET | Freq: Two times a day (BID) | ORAL | Status: DC | PRN
  Filled 2018-10-19: qty 2

## 2018-10-19 MED ORDER — HYDROXYZINE HCL 25 MG PO TABS
25.0000 mg | ORAL_TABLET | Freq: Four times a day (QID) | ORAL | Status: DC | PRN
  Administered 2018-10-20 – 2018-10-21 (×2): 25 mg via ORAL
  Filled 2018-10-19 (×2): qty 1

## 2018-10-19 MED ORDER — NICOTINE 21 MG/24HR TD PT24
21.0000 mg | MEDICATED_PATCH | Freq: Every day | TRANSDERMAL | Status: DC
  Administered 2018-10-19 – 2018-10-21 (×3): 21 mg via TRANSDERMAL
  Filled 2018-10-19 (×3): qty 1

## 2018-10-19 MED ORDER — CEPHALEXIN 500 MG PO CAPS
500.0000 mg | ORAL_CAPSULE | Freq: Four times a day (QID) | ORAL | Status: DC
  Administered 2018-10-19 – 2018-10-21 (×9): 500 mg via ORAL
  Filled 2018-10-19 (×10): qty 1

## 2018-10-19 MED ORDER — CLONIDINE HCL 0.1 MG PO TABS: 0.1000 mg | ORAL_TABLET | Freq: Every day | ORAL | Status: DC

## 2018-10-19 MED ORDER — METHOCARBAMOL 500 MG PO TABS: 500.0000 mg | ORAL_TABLET | Freq: Three times a day (TID) | ORAL | Status: DC | PRN

## 2018-10-19 MED ORDER — DICYCLOMINE HCL 20 MG PO TABS
20.0000 mg | ORAL_TABLET | Freq: Four times a day (QID) | ORAL | Status: DC | PRN
  Filled 2018-10-19: qty 1

## 2018-10-19 MED ORDER — ACETAMINOPHEN 325 MG PO TABS: 650.0000 mg | ORAL_TABLET | Freq: Four times a day (QID) | ORAL | Status: DC | PRN

## 2018-10-19 MED ORDER — SODIUM CHLORIDE 0.9 % IV SOLN
INTRAVENOUS | Status: AC
  Administered 2018-10-19 – 2018-10-20 (×2): via INTRAVENOUS

## 2018-10-19 MED ORDER — LOPERAMIDE HCL 2 MG PO CAPS: 2.0000 mg | ORAL_CAPSULE | ORAL | Status: DC | PRN

## 2018-10-19 MED ORDER — CLONIDINE HCL 0.1 MG PO TABS
0.1000 mg | ORAL_TABLET | Freq: Four times a day (QID) | ORAL | Status: AC
  Administered 2018-10-19 – 2018-10-20 (×8): 0.1 mg via ORAL
  Filled 2018-10-19 (×8): qty 1

## 2018-10-19 MED ORDER — MUPIROCIN 2 % EX OINT
TOPICAL_OINTMENT | Freq: Two times a day (BID) | CUTANEOUS | Status: DC
  Administered 2018-10-19 – 2018-10-21 (×4): via NASAL
  Filled 2018-10-19: qty 22

## 2018-10-19 MED ORDER — POTASSIUM CHLORIDE CRYS ER 20 MEQ PO TBCR
40.0000 meq | EXTENDED_RELEASE_TABLET | Freq: Once | ORAL | Status: AC
  Administered 2018-10-19: 40 meq via ORAL
  Filled 2018-10-19: qty 2

## 2018-10-19 MED ORDER — CLONIDINE HCL 0.1 MG PO TABS
0.1000 mg | ORAL_TABLET | ORAL | Status: DC
  Administered 2018-10-21: 0.1 mg via ORAL
  Filled 2018-10-19: qty 1

## 2018-10-19 MED ORDER — ONDANSETRON HCL 4 MG PO TABS: 4.0000 mg | ORAL_TABLET | Freq: Four times a day (QID) | ORAL | Status: DC | PRN

## 2018-10-19 MED ORDER — MUPIROCIN CALCIUM 2 % EX CREA: TOPICAL_CREAM | Freq: Two times a day (BID) | CUTANEOUS | Status: DC

## 2018-10-19 MED ORDER — ONDANSETRON HCL 4 MG/2ML IJ SOLN: 4.0000 mg | Freq: Four times a day (QID) | INTRAMUSCULAR | Status: DC | PRN

## 2018-10-19 MED ORDER — ACETAMINOPHEN 650 MG RE SUPP: 650.0000 mg | Freq: Four times a day (QID) | RECTAL | Status: DC | PRN

## 2018-10-19 MED ORDER — ONDANSETRON 4 MG PO TBDP: 4.0000 mg | ORAL_TABLET | Freq: Four times a day (QID) | ORAL | Status: DC | PRN

## 2018-10-19 MED ORDER — SODIUM CHLORIDE 0.9 % IV SOLN
INTRAVENOUS | Status: AC
  Administered 2018-10-19: 04:00:00 via INTRAVENOUS

## 2018-10-19 MED ORDER — LORAZEPAM 2 MG/ML IJ SOLN
2.0000 mg | INTRAMUSCULAR | Status: DC | PRN
  Administered 2018-10-19 – 2018-10-20 (×2): 2 mg via INTRAVENOUS
  Filled 2018-10-19: qty 1
  Filled 2018-10-19: qty 2

## 2018-10-19 NOTE — Significant Event (Signed)
Shift event: RN paged this NP because pt was attempting to leave the unit. Per RN and ST, son was visiting her and he was acting fishy. He kept wanting to go in the bathroom with the pt and was disrupting care.  The pt has been out of her room wanting to go out and smoke. Security and [employer] called to bedside. NP to bedside. Brief HPI: 53 yo WF with an extensive hx of substance abuse-daily ETOH, Marijuana, Tobacco, Cocaine, and IVDA. She was admitted after swallowing a "ball" of Cocaine in order to avoid arrest. Per admission H&P, she is to be discharged to custody of police for arrest.  S: pt states she is jumpy because she needs to smoke a cigarette. Smokes 2 [employer] for many years. No pain anywhere. Questioning why she is under suicide precautions.  O: Appears well and anxious. NAD. Afebrile. HR 80s. RR 16-20. SaO2 97% on [place]. BP 170s. Alert and oriented. Answers questions appropriately. Skin is warm and dry. Card: RRR on tele.  Resp: No increased WOB. Neuro: no focal neuro deficit.  A/P: 1. Drug overdose-pt states she was not trying to kill herself and swallowed drug to avoid police finding it. NP explained that with drug overdose and drug abuse, we have to keep her safe and that she is being detained because she is deemed harmful to herself. Also discussed that apparently, she is to be discharged to police custody. Son asked to leave and he cooperated.  2. Tobacco abuse-Nicoderm.  3. ETOH/cocaine OD-CIWA with Ativan.  IVC papers completed, signed by Dr. Hal Hope, and faxed to magistrate.  *prolonged care due to counseling of patient and IVC. Start: 0820, end 0855. Total 58mns.  KJKG, NP [facility]

## 2018-10-19 NOTE — ED Notes (Signed)
ED TO INPATIENT HANDOFF REPORT  Name/Age/Gender Holly Weber 53 y.o. female  Code Status    Code Status Orders  (From admission, onward)         Start     Ordered   10/19/18 0329  Full code  Continuous     10/19/18 0328        Code Status History    Date Active Date Inactive Code Status Order ID Comments User Context   12/27/2016 0544 12/30/2016 1631 Full Code [id number]  Etta Quill, DO ED   12/22/2016 0903 12/24/2016 0724 Full Code [id number]  Waldemar Dickens, MD ED   12/13/2016 1701 12/22/2016 0122 Full Code [id number]  Maren Reamer, MD ED      Home/SNF/Other Home  Chief Complaint shoulder pain;crack ingestion  Level of Care/Admitting Diagnosis ED Disposition    ED Disposition Condition [facility] Area: [facility] [[id number]]  Level of Care: Stepdown [14]  Admit to SDU based on following criteria: Hemodynamic compromise or significant risk of instability:  Patient requiring short term acute titration and management of vasoactive drips, and invasive monitoring (i.e., CVP and Arterial line).  Admit to SDU based on following criteria: Severe physiological/psychological symptoms:  Any diagnosis requiring assessment & intervention at least every 4 hours on an ongoing basis to obtain desired patient outcomes including stability and rehabilitation  Diagnosis: Cocaine overdose [facility]) [[id number]]  Admitting Physician: Toy Baker [[id number]]  Attending Physician: Toy Baker [[id number]]  PT Class (Do Not Modify): Observation [104]  PT Acc Code (Do Not Modify): Observation [10022]       Medical History Past Medical History:  Diagnosis Date  . Colitis, ulcerative ([place])   . Depression   . Hepatitis C   . Hypertension   . Stroke ([place])   . Thyroid disease     Allergies Allergies  Allergen Reactions  . Asa [Aspirin] Other (See Comments)    Ulcerative colitis flares.  . Nsaids Other (See Comments)    Ulcerative colitis flares   . Penicillins Rash and Other (See Comments)    Has patient had a PCN reaction causing immediate rash, facial/tongue/throat swelling, SOB or lightheadedness with hypotension:unsure Has patient had a PCN reaction causing severe rash involving mucus membranes or skin necrosis:unsure Has patient had a PCN reaction that required hospitalization:No Has patient had a PCN reaction occurring within the last 10 years:Yes If all of the above answers are "NO", then may proceed with Cephalosporin use.     IV Location/Drains/Wounds Patient Lines/Drains/Airways Status   Active Line/Drains/Airways    Name:   Placement date:   Placement time:   Site:   Days:   Peripheral IV 10/18/18 Right Arm   10/18/18    1716    Arm   1   Incision (Closed) 01/07/18 Wrist Left   01/07/18    1445     285          Labs/Imaging Results for orders placed or performed during the hospital encounter of 10/18/18 (from the past 48 hour(s))  Comprehensive metabolic panel     Status: Abnormal   Collection Time: 10/18/18  3:13 PM  Result Value Ref Range   Sodium 135 135 - 145 mmol/L   Potassium 3.3 (L) 3.5 - 5.1 mmol/L   Chloride 102 98 - 111 mmol/L   CO2 22 22 - 32 mmol/L   Glucose, Bld 148 (H) 70 - 99 mg/dL   BUN 8 6 - 20 mg/dL  Creatinine, Ser 0.57 0.44 - 1.00 mg/dL   Calcium 8.5 (L) 8.9 - 10.3 mg/dL   Total Protein 8.4 (H) 6.5 - 8.1 g/dL   Albumin 3.8 3.5 - 5.0 g/dL   AST 27 15 - 41 U/L   ALT 23 0 - 44 U/L   Alkaline Phosphatase 96 38 - 126 U/L   Total Bilirubin 0.9 0.3 - 1.2 mg/dL   GFR calc non Af Amer >60 >60 mL/min   GFR calc Af Amer >60 >60 mL/min   Anion gap 11 5 - 15    Comment: Performed at [facility], [place] [address] [place], [place] [address]  Ethanol     Status: None   Collection Time: 10/18/18  3:13 PM  Result Value Ref Range   Alcohol, Ethyl (B) <10 <10 mg/dL    Comment: (NOTE) Lowest detectable limit for serum alcohol is 10 mg/dL. For medical purposes only. Performed at  [facility], [place] [address] [place], [place] [address]   Salicylate level     Status: None   Collection Time: 10/18/18  3:13 PM  Result Value Ref Range   Salicylate Lvl <5.4 2.8 - 30.0 mg/dL    Comment: Performed at [facility], [place] [address], [place], [place] [address]  Acetaminophen level     Status: Abnormal   Collection Time: 10/18/18  3:13 PM  Result Value Ref Range   Acetaminophen (Tylenol), Serum <10 (L) 10 - 30 ug/mL    Comment: (NOTE) Therapeutic concentrations vary significantly. A range of 10-30 ug/mL  may be an effective concentration for many patients. However, some  are best treated at concentrations outside of this range. Acetaminophen concentrations >150 ug/mL at 4 hours after ingestion  and >50 ug/mL at 12 hours after ingestion are often associated with  toxic reactions. Performed at [facility], [place] [address], [place], [place] [address]   cbc     Status: Abnormal   Collection Time: 10/18/18  3:13 PM  Result Value Ref Range   WBC 10.0 4.0 - 10.5 K/uL   RBC 5.50 (H) 3.87 - 5.11 MIL/uL   Hemoglobin 13.0 12.0 - 15.0 g/dL   HCT 43.7 36.0 - 46.0 %   MCV 79.5 (L) 80.0 - 100.0 fL   MCH 23.6 (L) 26.0 - 34.0 pg   MCHC 29.7 (L) 30.0 - 36.0 g/dL   RDW 15.7 (H) 11.5 - 15.5 %   Platelets 249 150 - 400 K/uL   nRBC 0.0 0.0 - 0.2 %    Comment: Performed at [facility], [place] [address] [place], [place] [address]  I-Stat beta hCG blood, ED     Status: Abnormal   Collection Time: 10/18/18  3:30 PM  Result Value Ref Range   I-stat hCG, quantitative 5.3 (H) <5 mIU/mL   Comment 3            Comment:   GEST. AGE      CONC.  (mIU/mL)   <=1 WEEK        5 - 50     2 WEEKS       50 - 500     3 WEEKS       100 - 10,000     4 WEEKS     1,000 - 30,000        FEMALE AND NON-PREGNANT FEMALE:     LESS THAN 5 mIU/mL   POC Urine Pregnancy, ED (do NOT order at  [facility])     Status: None   Collection Time:  10/18/18  4:21 PM  Result Value Ref Range   Preg Test, Ur NEGATIVE NEGATIVE    Comment:        THE SENSITIVITY OF THIS METHODOLOGY IS >24 mIU/mL   Rapid urine drug screen (hospital performed)     Status: Abnormal   Collection Time: 10/18/18  4:23 PM  Result Value Ref Range   Opiates NONE DETECTED NONE DETECTED   Cocaine POSITIVE (A) NONE DETECTED   Benzodiazepines NONE DETECTED NONE DETECTED   Amphetamines NONE DETECTED NONE DETECTED   Tetrahydrocannabinol POSITIVE (A) NONE DETECTED   Barbiturates NONE DETECTED NONE DETECTED    Comment: (NOTE) DRUG SCREEN FOR MEDICAL PURPOSES ONLY.  IF CONFIRMATION IS NEEDED FOR ANY PURPOSE, NOTIFY LAB WITHIN 5 DAYS. LOWEST DETECTABLE LIMITS FOR URINE DRUG SCREEN Drug Class                     Cutoff (ng/mL) Amphetamine and metabolites    1000 Barbiturate and metabolites    200 Benzodiazepine                 270 Tricyclics and metabolites     300 Opiates and metabolites        300 Cocaine and metabolites        300 THC                            50 Performed at [facility], [place] [address], [place], [place] [address]   Magnesium     Status: None   Collection Time: 10/18/18  5:11 PM  Result Value Ref Range   Magnesium 2.2 1.7 - 2.4 mg/dL    Comment: Performed at [facility], [place] [address], [place], [place] [address]  Magnesium     Status: None   Collection Time: 10/19/18  2:28 AM  Result Value Ref Range   Magnesium 2.1 1.7 - 2.4 mg/dL    Comment: Performed at [facility], [place] [address], [place], [place] [address]  Phosphorus     Status: None   Collection Time: 10/19/18  2:28 AM  Result Value Ref Range   Phosphorus 3.1 2.5 - 4.6 mg/dL    Comment: Performed at [facility], [place] [address], [place], [place] [address]  MRSA PCR Screening     Status: Abnormal   Collection Time: 10/19/18  2:28 AM  Result Value Ref Range   MRSA by PCR POSITIVE (A) NEGATIVE     Comment:        The GeneXpert MRSA Assay (FDA approved for NASAL specimens only), is one component of a comprehensive MRSA colonization surveillance program. It is not intended to diagnose MRSA infection nor to guide or monitor treatment for MRSA infections. CRITICAL RESULT CALLED TO, READ BACK BY AND VERIFIED WITH: FARRAR S. @0415  10/19/18 CRUICKSHANK A Performed at [facility], [place] [address], [place], [place] [address]   Comprehensive metabolic panel     Status: Abnormal   Collection Time: 10/19/18  2:30 AM  Result Value Ref Range   Sodium 138 135 - 145 mmol/L   Potassium 3.4 (L) 3.5 - 5.1 mmol/L   Chloride 107 98 - 111 mmol/L   CO2 24 22 - 32 mmol/L   Glucose, Bld 118 (H) 70 - 99 mg/dL   BUN 6 6 - 20 mg/dL  Creatinine, Ser 0.45 0.44 - 1.00 mg/dL   Calcium 8.8 (L) 8.9 - 10.3 mg/dL   Total Protein 7.8 6.5 - 8.1 g/dL   Albumin 3.5 3.5 - 5.0 g/dL   AST 25 15 - 41 U/L   ALT 22 0 - 44 U/L   Alkaline Phosphatase 91 38 - 126 U/L   Total Bilirubin 1.1 0.3 - 1.2 mg/dL   GFR calc non Af Amer >60 >60 mL/min   GFR calc Af Amer >60 >60 mL/min   Anion gap 7 5 - 15    Comment: Performed at [facility], [place] [address] [place], [place] [address]  CBC     Status: Abnormal   Collection Time: 10/19/18  2:30 AM  Result Value Ref Range   WBC 8.9 4.0 - 10.5 K/uL   RBC 5.36 (H) 3.87 - 5.11 MIL/uL   Hemoglobin 12.5 12.0 - 15.0 g/dL   HCT 41.7 36.0 - 46.0 %   MCV 77.8 (L) 80.0 - 100.0 fL   MCH 23.3 (L) 26.0 - 34.0 pg   MCHC 30.0 30.0 - 36.0 g/dL   RDW 15.9 (H) 11.5 - 15.5 %   Platelets 279 150 - 400 K/uL   nRBC 0.0 0.0 - 0.2 %    Comment: Performed at [facility], [place] [address] [place], [place] [address]  I-Stat Troponin, ED (not at [facility])     Status: None   Collection Time: 10/19/18  2:34 AM  Result Value Ref Range   Troponin i, poc 0.00 0.00 - 0.08 ng/mL   Comment 3            Comment: Due to the release kinetics of  cTnI, a negative result within the first hours of the onset of symptoms does not rule out myocardial infarction with certainty. If myocardial infarction is still suspected, repeat the test at appropriate intervals.    No results found. None  Pending Labs Unresulted Labs (From admission, onward)    Start     Ordered   10/19/18 0500  TSH  Once,   R    Comments:  Cancel if already done within 1 month and notify MD    10/19/18 0328   10/19/18 0500  HIV Antibody (routine testing w rflx)  Once,   R     10/19/18 0500          Vitals/Pain Today's Vitals   10/19/18 1145 10/19/18 1215 10/19/18 1400 10/19/18 1406  BP: (!) 159/75 (!) 158/80 (!) 181/101 (!) 177/96  Pulse: 92 90 94   Resp: (!) 29 (!) 23  (!) 34  Temp:      TempSrc:      SpO2: 92% 90% (!) 87%   Weight:      Height:      PainSc:        Isolation Precautions No active isolations  Medications Medications  charcoal activated (NO SORBITOL) (ACTIDOSE-AQUA) suspension 75 g (75 g Oral Refused 10/18/18 2158)  LORazepam (ATIVAN) 2 MG/ML injection (has no administration in time range)  LORazepam (ATIVAN) injection 2-3 mg (has no administration in time range)  acetaminophen (TYLENOL) tablet 650 mg (has no administration in time range)    Or  acetaminophen (TYLENOL) suppository 650 mg (has no administration in time range)  ondansetron (ZOFRAN) tablet 4 mg (has no administration in time range)    Or  ondansetron (ZOFRAN) injection 4 mg (has no administration in time range)  0.9 %  sodium chloride infusion (  Intravenous Stopped 10/19/18 1419)  cephALEXin (KEFLEX) capsule 500 mg (500 mg Oral Given 10/19/18 1000)  dicyclomine (BENTYL) tablet 20 mg (has no administration in time range)  hydrOXYzine (ATARAX/VISTARIL) tablet 25 mg (has no administration in time range)  loperamide (IMODIUM) capsule 2-4 mg (has no administration in time range)  methocarbamol (ROBAXIN) tablet 500 mg (has no administration in time range)  naproxen  (NAPROSYN) tablet 500 mg (has no administration in time range)  ondansetron (ZOFRAN-ODT) disintegrating tablet 4 mg (has no administration in time range)  cloNIDine (CATAPRES) tablet 0.1 mg (0.1 mg Oral Given 10/19/18 1409)    Followed by  cloNIDine (CATAPRES) tablet 0.1 mg (has no administration in time range)    Followed by  cloNIDine (CATAPRES) tablet 0.1 mg (has no administration in time range)  potassium chloride SA (K-DUR,KLOR-CON) CR tablet 40 mEq (40 mEq Oral Given 10/18/18 1716)  sodium chloride 0.9 % bolus 1,000 mL (0 mLs Intravenous Stopped 10/18/18 1814)  hydrALAZINE (APRESOLINE) injection 5 mg (5 mg Intravenous Given 10/18/18 1831)  sodium chloride 0.9 % bolus 500 mL (0 mLs Intravenous Stopped 10/18/18 2246)  potassium chloride SA (K-DUR,KLOR-CON) CR tablet 40 mEq (40 mEq Oral Given 10/19/18 1000)    Mobility walks

## 2018-10-19 NOTE — ED Notes (Signed)
Sitter at bedside.  Pt seen leaning forward, able to verbalize where she is.  When asked by this RN, "Can you tell me your name" pt states "well whats your name?" This RN provided name and reiterated question, "Can you tell me your name?" Pt remained silent.  Will continue to monitor.

## 2018-10-19 NOTE — Progress Notes (Signed)
PROGRESS NOTE    SHANIN SZYMANOWSKI  SPQ:[id number] DOB: 04-04-1966 DOA: 10/18/2018 PCP: Patient, No Pcp Per   Brief Narrative:  Patient is a 53 year old female medical history of depression, hypertension, CVA presented with shoulder pain but later stated that she may have swallowed crack while being detained.  Patient states she swallowed crack in a ball which was about 3.5 g as she was detained by the police.  Patient states she uses crack every day and usually uses about 3 g throughout the day.  Patient swallowed a crack so the police would not get it.  Patient seen in the ED poison control was contacted who recommended administration of charcoal but patient refused.  Patient admitted to the stepdown unit for closer monitoring and placed on IV Ativan.   Assessment & Plan:   Active Problems:   IV drug user   Polysubstance abuse ([place])   Cocaine overdose ([place])   Cellulitis   Hypokalemia   Crack cocaine overdose ([place])  #1 crack cocaine ingestion/crack cocaine overdose. Patient noted to have ingested crack cocaine and attempt to avoid jail time while being detained by the police.  Patient did not endorse any suicidal ideation.  Poison control was contacted who recommended charcoal however patient refused.  Patient awaiting bed in the stepdown unit.  Patient on Ativan as needed.  Placed on the clonidine detox protocol.  IV fluids.  Supportive care.  2.  Cellulitis Likely secondary to skin popping.  Continue Keflex.  3.  IV drug use Patient with skin popping administering crack cocaine under the skin resulting in multiple areas of localized skin reactions or cellulitis.  Patient currently on Keflex.  Monitor closely.  4.  Polysubstance abuse Social work consulted.  5.  Alcohol abuse Continue the Ativan CIWA protocol.  6.  Hypokalemia Replete.  Magnesium at 2.1.   DVT prophylaxis: SCDs Code Status: Full Family Communication: No family at bedside. Disposition Plan: Discharge when  clinically stable, and no further requirements of Ativan.   Consultants:   None  Procedures:   None  Antimicrobials:  Keflex 10/19/2018   Subjective: Patient asleep.  Patient received IV Ativan.  Objective: Vitals:   10/19/18 1505 10/19/18 1550 10/19/18 1700 10/19/18 1800  BP: (!) 193/91  (!) 191/93 (!) 176/93  Pulse:   83   Resp:   (!) 25   Temp:  97.9 F (36.6 C)    TempSrc:      SpO2:   97%   Weight:      Height:        Intake/Output Summary (Last 24 hours) at 10/19/2018 2049 Last data filed at 10/19/2018 1734 Gross per 24 hour  Intake 1360 ml  Output -  Net 1360 ml   Filed Weights   10/18/18 1440  Weight: 85.9 kg    Examination:  General exam: Patient asleep. Respiratory system: Lungs clear to auscultation bilaterally anterior lung fields.  Normal respiratory effort.   Cardiovascular system: S1 & S2 heard, RRR. No JVD, murmurs, rubs, gallops or clicks. No pedal edema. Gastrointestinal system: Abdomen is nondistended, soft and nontender. No organomegaly or masses felt. Normal bowel sounds heard. Central nervous system: Alert and oriented. No focal neurological deficits. Extremities: Symmetric 5 x 5 power. Skin: Multiple dry scarring areas of abscesses and scabbing consistent with skin popping.  Psychiatry: Judgement and insight appear normal. Mood & affect appropriate.     Data Reviewed: I have personally reviewed following labs and imaging studies  CBC: Recent Labs  Lab  10/18/18 1513 10/19/18 0230  WBC 10.0 8.9  HGB 13.0 12.5  HCT 43.7 41.7  MCV 79.5* 77.8*  PLT 249 616   Basic Metabolic Panel: Recent Labs  Lab 10/18/18 1513 10/18/18 1711 10/19/18 0228 10/19/18 0230  NA 135  --   --  138  K 3.3*  --   --  3.4*  CL 102  --   --  107  CO2 22  --   --  24  GLUCOSE 148*  --   --  118*  BUN 8  --   --  6  CREATININE 0.57  --   --  0.45  CALCIUM 8.5*  --   --  8.8*  MG  --  2.2 2.1  --   PHOS  --   --  3.1  --    GFR: Estimated  Creatinine Clearance: 91 mL/min (by C-G formula based on SCr of 0.45 mg/dL). Liver Function Tests: Recent Labs  Lab 10/18/18 1513 10/19/18 0230  AST 27 25  ALT 23 22  ALKPHOS 96 91  BILITOT 0.9 1.1  PROT 8.4* 7.8  ALBUMIN 3.8 3.5   No results for input(s): LIPASE, AMYLASE in the last 168 hours. No results for input(s): AMMONIA in the last 168 hours. Coagulation Profile: No results for input(s): INR, PROTIME in the last 168 hours. Cardiac Enzymes: No results for input(s): CKTOTAL, CKMB, CKMBINDEX, TROPONINI in the last 168 hours. BNP (last 3 results) No results for input(s): PROBNP in the last 8760 hours. HbA1C: No results for input(s): HGBA1C in the last 72 hours. CBG: No results for input(s): GLUCAP in the last 168 hours. Lipid Profile: No results for input(s): CHOL, HDL, LDLCALC, TRIG, CHOLHDL, LDLDIRECT in the last 72 hours. Thyroid Function Tests: Recent Labs    10/19/18 1420  TSH 0.996   Anemia Panel: No results for input(s): VITAMINB12, FOLATE, FERRITIN, TIBC, IRON, RETICCTPCT in the last 72 hours. Sepsis Labs: No results for input(s): PROCALCITON, LATICACIDVEN in the last 168 hours.  Recent Results (from the past 240 hour(s))  MRSA PCR Screening     Status: Abnormal   Collection Time: 10/19/18  2:28 AM  Result Value Ref Range Status   MRSA by PCR POSITIVE (A) NEGATIVE Final    Comment:        The GeneXpert MRSA Assay (FDA approved for NASAL specimens only), is one component of a comprehensive MRSA colonization surveillance program. It is not intended to diagnose MRSA infection nor to guide or monitor treatment for MRSA infections. CRITICAL RESULT CALLED TO, READ BACK BY AND VERIFIED WITH: Dorina Hoyer S. @0415  10/19/18 CRUICKSHANK A Performed at [facility], [place] [address], [place], [place] [address]          Radiology Studies: No results found.      Scheduled Meds: . cephALEXin  500 mg Oral Q6H  . charcoal activated (NO  SORBITOL)  75 g Oral Once  . cloNIDine  0.1 mg Oral QID   Followed by  . [START ON 10/21/2018] cloNIDine  0.1 mg Oral BH-qamhs   Followed by  . [START ON 10/23/2018] cloNIDine  0.1 mg Oral QAC breakfast  . nicotine  21 mg Transdermal Daily   Continuous Infusions: . sodium chloride       LOS: 0 days    Time spent: 35 minutes    Irine Seal, MD [facility]  If 7PM-7AM, please contact night-coverage [email or web address] Password [facility] 10/19/2018, 8:49 PM

## 2018-10-20 DIAGNOSIS — T405X1D Poisoning by cocaine, accidental (unintentional), subsequent encounter: Secondary | ICD-10-CM

## 2018-10-20 DIAGNOSIS — K122 Cellulitis and abscess of mouth: Secondary | ICD-10-CM

## 2018-10-20 LAB — BLOOD GAS, ARTERIAL
Acid-Base Excess: 0.3 mmol/L (ref 0.0–2.0)
Bicarbonate: 22.9 mmol/L (ref 20.0–28.0)
Drawn by: 441261
FIO2: 21
O2 SAT: 94.5 %
PCO2 ART: 31.5 mmHg — AB (ref 32.0–48.0)
Patient temperature: 98.6
pH, Arterial: 7.474 — ABNORMAL HIGH (ref 7.350–7.450)
pO2, Arterial: 69 mmHg — ABNORMAL LOW (ref 83.0–108.0)

## 2018-10-20 LAB — BASIC METABOLIC PANEL
Anion gap: 7 (ref 5–15)
BUN: 6 mg/dL (ref 6–20)
CO2: 23 mmol/L (ref 22–32)
Calcium: 8.5 mg/dL — ABNORMAL LOW (ref 8.9–10.3)
Chloride: 107 mmol/L (ref 98–111)
Creatinine, Ser: 0.58 mg/dL (ref 0.44–1.00)
GFR calc Af Amer: >60 mL/min (ref >60)
GFR calc non Af Amer: >60 mL/min (ref >60)
Glucose, Bld: 114 mg/dL — ABNORMAL HIGH (ref 70–99)
Potassium: 3.7 mmol/L (ref 3.5–5.1)
Sodium: 137 mmol/L (ref 135–145)

## 2018-10-20 LAB — CBC
HCT: 41.5 % (ref 36.0–46.0)
Hemoglobin: 12 g/dL (ref 12.0–15.0)
MCH: 22.6 pg — ABNORMAL LOW (ref 26.0–34.0)
MCHC: 28.9 g/dL — ABNORMAL LOW (ref 30.0–36.0)
MCV: 78 fL — ABNORMAL LOW (ref 80.0–100.0)
Platelets: 256 10*3/uL (ref 150–400)
RBC: 5.32 MIL/uL — AB (ref 3.87–5.11)
RDW: 15.9 % — ABNORMAL HIGH (ref 11.5–15.5)
WBC: 6 10*3/uL (ref 4.0–10.5)
nRBC: 0 % (ref 0.0–0.2)

## 2018-10-20 LAB — HIV ANTIBODY (ROUTINE TESTING W REFLEX): HIV Screen 4th Generation wRfx: NONREACTIVE

## 2018-10-20 MED ORDER — HYDRALAZINE HCL 20 MG/ML IJ SOLN
10.0000 mg | Freq: Four times a day (QID) | INTRAMUSCULAR | Status: DC | PRN
  Administered 2018-10-20 – 2018-10-21 (×3): 10 mg via INTRAVENOUS
  Filled 2018-10-20 (×3): qty 1

## 2018-10-20 MED ORDER — MAGIC MOUTHWASH
5.0000 mL | Freq: Four times a day (QID) | ORAL | Status: DC
  Administered 2018-10-20 – 2018-10-21 (×4): 5 mL via ORAL
  Filled 2018-10-20 (×6): qty 5

## 2018-10-20 MED ORDER — SODIUM CHLORIDE 0.9 % IV SOLN
INTRAVENOUS | Status: DC
  Administered 2018-10-20: 22:00:00 via INTRAVENOUS

## 2018-10-20 MED ORDER — NALOXONE HCL 0.4 MG/ML IJ SOLN
0.1000 mg | Freq: Once | INTRAMUSCULAR | Status: AC
  Administered 2018-10-20: 0.1 mg via INTRAVENOUS
  Filled 2018-10-20: qty 1

## 2018-10-20 NOTE — Care Management Note (Signed)
Case Management Note  Patient Details  Name: EVERLINE MAHAFFY MRN: [id number] Date of Birth: [birth year]  Subjective/Objective:                  Discharge Readiness Return to top of Drug Ingestion or Overdose RRG - [place]  Discharge readiness is indicated by patient meeting Recovery Milestones, including ALL of the following: ? Hemodynamic stability YES ? Dangerous arrhythmia absent YES ? Toxic manifestations absent or managed MANAGED WITH IV ATIVAN ? Need for continued inpatient monitoring absent NO  ? Mental status at baseline AROUSABLE ? Psychiatric risk status acceptable N/A ? Ambulatory NO ? Oral hydration, medications, and diet ? IV NS AT 125CC/HRS ? LEVEL OF CARE -SDU   Action/Plan: Following for progression of care. Following for cm needs none present at this time.  Expected Discharge Date:  (unknown)               Expected Discharge Plan:     In-House Referral:     Discharge planning Services     Post Acute Care Choice:    Choice offered to:     DME Arranged:    DME Agency:     HH Arranged:    HH Agency:     Status of Service:     If discussed at [employer], dates discussed:    Additional Comments:  Leeroy Cha, RN 10/20/2018, 10:09 AM

## 2018-10-20 NOTE — Progress Notes (Signed)
Dr Tana Coast notiifed that patient is alert but drowsy and has eaten little. Narcan 1 mg iv given . Patient remains drowsy.

## 2018-10-20 NOTE — Progress Notes (Signed)
Walked to bathroom with help to void. Remains very drowsy and was unable to eat breakfast.

## 2018-10-20 NOTE — Progress Notes (Signed)
MD paged and made aware of pt's drowsiness even after previous RN gave ordered Narcan.  Charge Nurse Christian at bedside, asking pt questions but pt keeps falling asleep.  MD called this RN back and gave VO to get ABG on patient.

## 2018-10-20 NOTE — Progress Notes (Signed)
[facility]                                                                              Patient Demographics  Holly Weber, is a 53 y.o. female, DOB - 01/09/1966, PPJ:[id number]  Admit date - 10/18/2018   Admitting Physician Holly Baker, MD  Outpatient Primary MD for the patient is Patient, No Pcp Per  Outpatient specialists:   LOS - 1  days   Medical records reviewed and are as summarized below:    Chief Complaint  Patient presents with  . Drug Overdose  . Shoulder Pain    right  . Abscess       Brief summary   Patient is a 53 year old female medical history of depression, hypertension, CVA presented with shoulder pain but later stated that she may have swallowed crack while being detained.  Patient states she swallowed crack in a ball which was about 3.5 g as she was detained by the police.  Patient states she uses crack every day and usually uses about 3 g throughout the day.  Patient swallowed a crack so the police would not get it.  Patient seen in the ED poison control was contacted who recommended administration of charcoal but patient refused.  Patient admitted to the stepdown unit for closer monitoring and placed on IV Ativan.   Assessment & Plan    Principal problem:    Crack cocaine overdose ([place]) -Patient noted to have ingested crack cocaine in an attempt to avoid jail time while she was being detained by the police.  Otherwise denies any suicidal ideation -Poison control was contacted who recommended charcoal but patient refused -Currently on clonidine detox protocol with Ativan -EKG showed no acute ST-T wave changes -Overnight events noted, patient was IVC'ed   Active problems Cellulitis -Likely due to skin popping, continue Keflex  IV drug use/ Polysubstance abuse -Patient with skin popping administering crack cocaine under the skin resulting in multiple areas of localized skin cellulitis, currently on Keflex  Alcohol  abuse -Currently on CIWA with Ativan  Hypokalemia -Resolved   Hypertension Currently on clonidine detox protocol, will continue clonidine, place on IV hydralazine as needed with parameters   Code Status: Full CODE STATUS DVT Prophylaxis:  SCD's Family Communication: No family member at the bedside   Disposition Plan: Once patient is alert and oriented  Time Spent in minutes 25 minutes  Procedures:  None  Consultants:   None  Antimicrobials:   Anti-infectives (From admission, onward)   Start     Dose/Rate Route Frequency Ordered Stop   10/19/18 0600  cephALEXin (KEFLEX) capsule 500 mg     500 mg Oral Every 6 hours 10/19/18 0327 10/24/18 0559          Medications  Scheduled Meds: . cephALEXin  500 mg Oral Q6H  . charcoal activated (NO SORBITOL)  75 g Oral Once  . cloNIDine  0.1 mg Oral QID   Followed by  . [START ON 10/21/2018] cloNIDine  0.1 mg Oral BH-qamhs   Followed by  . [START ON 10/23/2018] cloNIDine  0.1 mg Oral QAC breakfast  . mupirocin ointment  Nasal BID  . nicotine  21 mg Transdermal Daily   Continuous Infusions: . sodium chloride 125 mL/hr at 10/20/18 0800   PRN Meds:.acetaminophen **OR** acetaminophen, dicyclomine, hydrALAZINE, hydrOXYzine, loperamide, LORazepam, methocarbamol, naproxen, ondansetron **OR** ondansetron (ZOFRAN) IV      Subjective:   Holly Weber was seen and examined today.  Drowsy, but arousable.  Patient denies dizziness, chest pain, shortness of breath, abdominal pain, N/V/D/C, new weakness  Objective:   Vitals:   10/20/18 0500 10/20/18 0800 10/20/18 0815 10/20/18 0956  BP: (!) 154/78 (!) 172/82  (!) 187/80  Pulse: 92 87    Resp: (!) 23 (!) 25    Temp:   97.9 F (36.6 C)   TempSrc:   Axillary   SpO2: 94% 97%    Weight:      Height:        Intake/Output Summary (Last 24 hours) at 10/20/2018 1131 Last data filed at 10/20/2018 0800 Gross per 24 hour  Intake 2332.58 ml  Output -  Net 2332.58 ml     Wt  Readings from Last 3 Encounters:  10/18/18 85.9 kg  12/19/17 68 kg  01/01/17 72.7 kg     Exam  General: Somnolent  Eyes: PERRLA, EOMI, Anicteric Sclera,  HEENT:   Cardiovascular: S1 S2 auscultated, Regular rate and rhythm.  Respiratory: Clear to auscultation bilaterally, no wheezing, rales or rhonchi  Gastrointestinal: Soft, nontender, nondistended, + bowel sounds  Ext: no pedal edema bilaterally  Neuro: Somnolent  Musculoskeletal: No digital cyanosis, clubbing  Skin: No rashes  Psych: Somnolent   Data Reviewed:  I have personally reviewed following labs and imaging studies  Micro Results Recent Results (from the past 240 hour(s))  MRSA PCR Screening     Status: Abnormal   Collection Time: 10/19/18  2:28 AM  Result Value Ref Range Status   MRSA by PCR POSITIVE (A) NEGATIVE Final    Comment:        The GeneXpert MRSA Assay (FDA approved for NASAL specimens only), is one component of a comprehensive MRSA colonization surveillance program. It is not intended to diagnose MRSA infection nor to guide or monitor treatment for MRSA infections. CRITICAL RESULT CALLED TO, READ BACK BY AND VERIFIED WITH: Dorina Hoyer S. @0415  10/19/18 CRUICKSHANK A Performed at [facility] [address] [place], [place] [address]     Radiology Reports No results found.  Lab Data:  CBC: Recent Labs  Lab 10/18/18 1513 10/19/18 0230 10/20/18 0307  WBC 10.0 8.9 6.0  HGB 13.0 12.5 12.0  HCT 43.7 41.7 41.5  MCV 79.5* 77.8* 78.0*  PLT 249 279 267   Basic Metabolic Panel: Recent Labs  Lab 10/18/18 1513 10/18/18 1711 10/19/18 0228 10/19/18 0230 10/20/18 0307  NA 135  --   --  138 137  K 3.3*  --   --  3.4* 3.7  CL 102  --   --  107 107  CO2 22  --   --  24 23  GLUCOSE 148*  --   --  118* 114*  BUN 8  --   --  6 6  CREATININE 0.57  --   --  0.45 0.58  CALCIUM 8.5*  --   --  8.8* 8.5*  MG  --  2.2 2.1  --   --   PHOS  --   --  3.1  --   --     GFR: Estimated Creatinine Clearance: 91 mL/min (by C-G formula based on SCr of  0.58 mg/dL). Liver Function Tests: Recent Labs  Lab 10/18/18 1513 10/19/18 0230  AST 27 25  ALT 23 22  ALKPHOS 96 91  BILITOT 0.9 1.1  PROT 8.4* 7.8  ALBUMIN 3.8 3.5   No results for input(s): LIPASE, AMYLASE in the last 168 hours. No results for input(s): AMMONIA in the last 168 hours. Coagulation Profile: No results for input(s): INR, PROTIME in the last 168 hours. Cardiac Enzymes: No results for input(s): CKTOTAL, CKMB, CKMBINDEX, TROPONINI in the last 168 hours. BNP (last 3 results) No results for input(s): PROBNP in the last 8760 hours. HbA1C: No results for input(s): HGBA1C in the last 72 hours. CBG: No results for input(s): GLUCAP in the last 168 hours. Lipid Profile: No results for input(s): CHOL, HDL, LDLCALC, TRIG, CHOLHDL, LDLDIRECT in the last 72 hours. Thyroid Function Tests: Recent Labs    10/19/18 1420  TSH 0.996   Anemia Panel: No results for input(s): VITAMINB12, FOLATE, FERRITIN, TIBC, IRON, RETICCTPCT in the last 72 hours. Urine analysis:    Component Value Date/Time   COLORURINE YELLOW 12/13/2016 0835   APPEARANCEUR HAZY (A) 12/13/2016 0835   LABSPEC 1.018 12/13/2016 0835   PHURINE 7.0 12/13/2016 0835   GLUCOSEU NEGATIVE 12/13/2016 0835   HGBUR NEGATIVE 12/13/2016 0835   BILIRUBINUR NEGATIVE 12/13/2016 0835   KETONESUR 5 (A) 12/13/2016 0835   PROTEINUR NEGATIVE 12/13/2016 0835   NITRITE NEGATIVE 12/13/2016 0835   LEUKOCYTESUR NEGATIVE 12/13/2016 0835       M.D. [facility] 10/20/2018, 11:31 AM  Pager: [phone] Between 7am to 7pm - call Pager - [phone]  After 7pm go to [email or web address] - password [email or web address]  Call night coverage person covering after 7pm

## 2018-10-20 NOTE — Progress Notes (Signed)
Pt had a BM which was searched.  Nothing found

## 2018-10-20 NOTE — Progress Notes (Signed)
BP is currently 189/73 (116). Pt received scheduled Catapres @ 2200. Provider notified. Awaiting orders. Will continue to monitor.  Mariann Laster, RN

## 2018-10-20 NOTE — Progress Notes (Signed)
Patient very sleepy this morning and hard to arouse. Dr Tana Coast in and updated .Notifed of EKG results.

## 2018-10-21 ENCOUNTER — Encounter (HOSPITAL_COMMUNITY): Payer: Self-pay

## 2018-10-21 ENCOUNTER — Inpatient Hospital Stay (HOSPITAL_COMMUNITY): Payer: Self-pay

## 2018-10-21 ENCOUNTER — Emergency Department (HOSPITAL_COMMUNITY): Payer: Self-pay

## 2018-10-21 ENCOUNTER — Other Ambulatory Visit: Payer: Self-pay

## 2018-10-21 ENCOUNTER — Emergency Department (HOSPITAL_COMMUNITY)
Admission: EM | Admit: 2018-10-21 | Discharge: 2018-10-21 | Disposition: A | Discharge status: Home / Self Care | Payer: Self-pay | Attending: Emergency Medicine | Admitting: Emergency Medicine

## 2018-10-21 DIAGNOSIS — T405X2A Poisoning by cocaine, intentional self-harm, initial encounter: Secondary | ICD-10-CM

## 2018-10-21 DIAGNOSIS — Z8673 Personal history of transient ischemic attack (TIA), and cerebral infarction without residual deficits: Secondary | ICD-10-CM | POA: Insufficient documentation

## 2018-10-21 DIAGNOSIS — R519 Headache, unspecified: Secondary | ICD-10-CM

## 2018-10-21 DIAGNOSIS — M542 Cervicalgia: Secondary | ICD-10-CM | POA: Insufficient documentation

## 2018-10-21 DIAGNOSIS — I10 Essential (primary) hypertension: Secondary | ICD-10-CM | POA: Insufficient documentation

## 2018-10-21 DIAGNOSIS — F17210 Nicotine dependence, cigarettes, uncomplicated: Secondary | ICD-10-CM | POA: Insufficient documentation

## 2018-10-21 DIAGNOSIS — R251 Tremor, unspecified: Secondary | ICD-10-CM | POA: Insufficient documentation

## 2018-10-21 DIAGNOSIS — R51 Headache: Secondary | ICD-10-CM | POA: Insufficient documentation

## 2018-10-21 LAB — BASIC METABOLIC PANEL
Anion gap: 8 (ref 5–15)
BUN: 8 mg/dL (ref 6–20)
CO2: 21 mmol/L — ABNORMAL LOW (ref 22–32)
Calcium: 8.5 mg/dL — ABNORMAL LOW (ref 8.9–10.3)
Chloride: 107 mmol/L (ref 98–111)
Creatinine, Ser: 0.55 mg/dL (ref 0.44–1.00)
GFR calc Af Amer: >60 mL/min (ref >60)
GFR calc non Af Amer: >60 mL/min (ref >60)
Glucose, Bld: 88 mg/dL (ref 70–99)
Potassium: 3.7 mmol/L (ref 3.5–5.1)
Sodium: 136 mmol/L (ref 135–145)

## 2018-10-21 LAB — COMPREHENSIVE METABOLIC PANEL
ALT: 29 U/L (ref 0–44)
AST: 37 U/L (ref 15–41)
Albumin: 3.9 g/dL (ref 3.5–5.0)
Alkaline Phosphatase: 98 U/L (ref 38–126)
Anion gap: 12 (ref 5–15)
BUN: 5 mg/dL — ABNORMAL LOW (ref 6–20)
CALCIUM: 9.2 mg/dL (ref 8.9–10.3)
CO2: 22 mmol/L (ref 22–32)
Chloride: 103 mmol/L (ref 98–111)
Creatinine, Ser: 0.55 mg/dL (ref 0.44–1.00)
GFR calc non Af Amer: >60 mL/min (ref >60)
Glucose, Bld: 130 mg/dL — ABNORMAL HIGH (ref 70–99)
Potassium: 3.4 mmol/L — ABNORMAL LOW (ref 3.5–5.1)
Sodium: 137 mmol/L (ref 135–145)
Total Bilirubin: 0.9 mg/dL (ref 0.3–1.2)
Total Protein: 8.6 g/dL — ABNORMAL HIGH (ref 6.5–8.1)

## 2018-10-21 LAB — CBC
HCT: 41.7 % (ref 36.0–46.0)
Hemoglobin: 12.2 g/dL (ref 12.0–15.0)
MCH: 22.7 pg — ABNORMAL LOW (ref 26.0–34.0)
MCHC: 29.3 g/dL — ABNORMAL LOW (ref 30.0–36.0)
MCV: 77.7 fL — ABNORMAL LOW (ref 80.0–100.0)
Platelets: 258 10*3/uL (ref 150–400)
RBC: 5.37 MIL/uL — ABNORMAL HIGH (ref 3.87–5.11)
RDW: 16.1 % — ABNORMAL HIGH (ref 11.5–15.5)
WBC: 7.1 10*3/uL (ref 4.0–10.5)
nRBC: 0 % (ref 0.0–0.2)

## 2018-10-21 LAB — CBC WITH DIFFERENTIAL/PLATELET
Abs Immature Granulocytes: 0.04 10*3/uL (ref 0.00–0.07)
Basophils Absolute: 0 10*3/uL (ref 0.0–0.1)
Basophils Relative: 1 %
Eosinophils Absolute: 0 10*3/uL (ref 0.0–0.5)
Eosinophils Relative: 0 %
HEMATOCRIT: 42.1 % (ref 36.0–46.0)
Hemoglobin: 12.9 g/dL (ref 12.0–15.0)
Immature Granulocytes: 1 %
LYMPHS ABS: 1.8 10*3/uL (ref 0.7–4.0)
Lymphocytes Relative: 22 %
MCH: 23.6 pg — ABNORMAL LOW (ref 26.0–34.0)
MCHC: 30.6 g/dL (ref 30.0–36.0)
MCV: 77 fL — ABNORMAL LOW (ref 80.0–100.0)
Monocytes Absolute: 0.4 10*3/uL (ref 0.1–1.0)
Monocytes Relative: 6 %
NRBC: 0 % (ref 0.0–0.2)
Neutro Abs: 5.7 10*3/uL (ref 1.7–7.7)
Neutrophils Relative %: 70 %
Platelets: 275 10*3/uL (ref 150–400)
RBC: 5.47 MIL/uL — ABNORMAL HIGH (ref 3.87–5.11)
RDW: 15.9 % — ABNORMAL HIGH (ref 11.5–15.5)
WBC: 8 10*3/uL (ref 4.0–10.5)

## 2018-10-21 MED ORDER — ADULT MULTIVITAMIN W/MINERALS CH
1.0000 | ORAL_TABLET | Freq: Every day | ORAL | Status: DC
  Administered 2018-10-21: 1 via ORAL
  Filled 2018-10-21: qty 1

## 2018-10-21 MED ORDER — ACETAMINOPHEN 325 MG PO TABS: 650.0000 mg | ORAL_TABLET | Freq: Once | ORAL | Status: DC

## 2018-10-21 MED ORDER — FOLIC ACID 1 MG PO TABS
1.0000 mg | ORAL_TABLET | Freq: Every day | ORAL | Status: DC
  Administered 2018-10-21: 1 mg via ORAL
  Filled 2018-10-21: qty 1

## 2018-10-21 MED ORDER — SODIUM CHLORIDE 0.9 % IV SOLN
INTRAVENOUS | Status: DC
  Administered 2018-10-21 (×2): via INTRAVENOUS

## 2018-10-21 MED ORDER — CEPHALEXIN 500 MG PO CAPS: 1000.0000 mg | ORAL_CAPSULE | Freq: Two times a day (BID) | ORAL | 0 refills | Status: AC

## 2018-10-21 MED ORDER — POTASSIUM CHLORIDE CRYS ER 20 MEQ PO TBCR: 20.0000 meq | EXTENDED_RELEASE_TABLET | Freq: Once | ORAL | Status: DC

## 2018-10-21 MED ORDER — NICOTINE 21 MG/24HR TD PT24: 21.0000 mg | MEDICATED_PATCH | Freq: Every day | TRANSDERMAL | 0 refills | Status: DC

## 2018-10-21 MED ORDER — LORAZEPAM 2 MG/ML IJ SOLN
1.0000 mg | Freq: Four times a day (QID) | INTRAMUSCULAR | Status: DC | PRN
  Administered 2018-10-21: 1 mg via INTRAVENOUS
  Filled 2018-10-21: qty 1

## 2018-10-21 MED ORDER — POTASSIUM CHLORIDE CRYS ER 20 MEQ PO TBCR: 40.0000 meq | EXTENDED_RELEASE_TABLET | Freq: Once | ORAL | Status: DC

## 2018-10-21 MED ORDER — VITAMIN B-1 100 MG PO TABS
100.0000 mg | ORAL_TABLET | Freq: Every day | ORAL | Status: DC
  Administered 2018-10-21: 100 mg via ORAL
  Filled 2018-10-21: qty 1

## 2018-10-21 MED ORDER — LORAZEPAM 1 MG PO TABS: 1.0000 mg | ORAL_TABLET | Freq: Four times a day (QID) | ORAL | Status: DC | PRN

## 2018-10-21 MED ORDER — THIAMINE HCL 100 MG/ML IJ SOLN: 100.0000 mg | Freq: Every day | INTRAMUSCULAR | Status: DC

## 2018-10-21 NOTE — ED Notes (Signed)
Bed: OT77 Expected date:  Expected time:  Means of arrival:  Comments: 81F seizure from jail

## 2018-10-21 NOTE — Progress Notes (Addendum)
[facility]                                                                              Patient Demographics  Holly Weber, is a 53 y.o. female, DOB - 01-13-1966, ZRA:[id number]  Admit date - 10/18/2018   Admitting Physician Toy Baker, MD  Outpatient Primary MD for the patient is Patient, No Pcp Per  Outpatient specialists:   LOS - 2  days   Medical records reviewed and are as summarized below:    Chief Complaint  Patient presents with  . Drug Overdose  . Shoulder Pain    right  . Abscess       Brief summary   Patient is a 53 year old female medical history of depression, hypertension, CVA presented with shoulder pain but later stated that she may have swallowed crack while being detained.  Patient states she swallowed crack in a ball which was about 3.5 g as she was detained by the police.  Patient states she uses crack every day and usually uses about 3 g throughout the day.  Patient swallowed a crack so the police would not get it.  Patient seen in the ED poison control was contacted who recommended administration of charcoal but patient refused.  Patient admitted to the stepdown unit for closer monitoring and placed on IV Ativan.   Assessment & Plan    Principal problem:    Crack cocaine overdose/intoxication ([place]) -Patient noted to have ingested crack cocaine in an attempt to avoid jail time while she was being detained by the police.  Otherwise denies any suicidal ideation -Poison control was contacted who recommended charcoal but patient refused -Currently on clonidine detox protocol with Ativan -EKG showed no acute ST-T wave changes -Patient was IVCD on 1/29 overnight -Patient remained very drowsy in the last 24 hours, this morning arousable, questionable right-sided droop, since no ball of cocaine was seen in the bowel movement, concern for stroke, MRI of the brain was done. -MRI of the brain showed questionable punctate infarct in the  right frontal lobe versus artifact.  Discussed with neurology, Dr Leonel Ramsay, who reviewed the MRI results and felt it is likely an artifact, does not change management as it is precipitated by acute cocaine intoxication even if it was a tiny punctate infarct.  No need of full stroke work-up. -Given patient has a prior history of stroke, would benefit from aspirin 81 mg daily however patient is allergic.   Active problems Cellulitis -Likely due to skin popping, continue Keflex  IV drug use/ Polysubstance abuse -Patient with skin popping administering crack cocaine under the skin resulting in multiple areas of localized skin cellulitis, currently on Keflex  Alcohol abuse -Currently on CIWA with Ativan, CIWA is low, has not required Ativan in the last 24 hours  Hypokalemia -Resolved  Hypertension Currently on clonidine detox protocol, will continue clonidine,  -Continue IV hydralazine as needed with parameters   Code Status: Full CODE STATUS DVT Prophylaxis:  SCD's Family Communication: No family member at the bedside   Disposition Plan: Transfer patient out to telemetry floor, if remains alert and oriented, will DC with [facility] please  Time Spent in minutes 25 minutes  Procedures:  MRI of the brain  Consultants:   Discussed with neurology, Dr. Leonel Ramsay  Antimicrobials:   Anti-infectives (From admission, onward)   Start     Dose/Rate Route Frequency Ordered Stop   10/19/18 0600  cephALEXin (KEFLEX) capsule 500 mg     500 mg Oral Every 6 hours 10/19/18 0327 10/24/18 0559         Medications  Scheduled Meds: . cephALEXin  500 mg Oral Q6H  . charcoal activated (NO SORBITOL)  75 g Oral Once  . cloNIDine  0.1 mg Oral BH-qamhs   Followed by  . [START ON 10/23/2018] cloNIDine  0.1 mg Oral QAC breakfast  . folic acid  1 mg Oral Daily  . magic mouthwash  5 mL Oral QID  . multivitamin with minerals  1 tablet Oral Daily  . mupirocin ointment   Nasal BID  .  nicotine  21 mg Transdermal Daily  . thiamine  100 mg Oral Daily   Or  . thiamine  100 mg Intravenous Daily   Continuous Infusions: . sodium chloride Stopped (10/21/18 1059)   PRN Meds:.acetaminophen **OR** acetaminophen, dicyclomine, hydrALAZINE, hydrOXYzine, loperamide, LORazepam **OR** LORazepam, methocarbamol, naproxen, ondansetron **OR** ondansetron (ZOFRAN) IV      Subjective:   Zettie Darsey was seen and examined today.  Waxing and waning mental status, drowsy at the time of my examination, arousable.  Patient denies dizziness, chest pain, shortness of breath, abdominal pain, N/V/D/C, new weakness  Objective:   Vitals:   10/21/18 0600 10/21/18 0700 10/21/18 0800 10/21/18 1127  BP: (!) 152/68 (!) 148/76  (!) 143/79  Pulse: 80 85  88  Resp: (!) 24 (!) 28  18  Temp:   98 F (36.7 C) 98 F (36.7 C)  TempSrc:   Oral Oral  SpO2: 95% 92%  95%  Weight:      Height:        Intake/Output Summary (Last 24 hours) at 10/21/2018 1240 Last data filed at 10/21/2018 0900 Gross per 24 hour  Intake 2748.05 ml  Output 300 ml  Net 2448.05 ml     Wt Readings from Last 3 Encounters:  10/18/18 85.9 kg  12/19/17 68 kg  01/01/17 72.7 kg   Physical Exam  General: Drowsy but arousable  Eyes:   HEENT:  Atraumatic, normocephalic  Cardiovascular: S1 S2 clear, no murmurs, RRR. No pedal edema b/l  Respiratory: CTAB, no wheezing, rales or rhonchi  Gastrointestinal: Soft, nontender, nondistended, NBS  Ext: no pedal edema bilaterally  Neuro: Strength 5/5 in upper and lower extremity, questionable right-sided facial droop  Musculoskeletal: No cyanosis, clubbing  Skin: No rashes  Psych: Drowsy but arousable     Data Reviewed:  I have personally reviewed following labs and imaging studies  Micro Results Recent Results (from the past 240 hour(s))  MRSA PCR Screening     Status: Abnormal   Collection Time: 10/19/18  2:28 AM  Result Value Ref Range Status   MRSA by PCR  POSITIVE (A) NEGATIVE Final    Comment:        The GeneXpert MRSA Assay (FDA approved for NASAL specimens only), is one component of a comprehensive MRSA colonization surveillance program. It is not intended to diagnose MRSA infection nor to guide or monitor treatment for MRSA infections. CRITICAL RESULT CALLED TO, READ BACK BY AND VERIFIED WITH: Dorina Hoyer S. @0415  10/19/18 CRUICKSHANK A Performed at [facility], [place] [address] [place],  [address]  Radiology Reports Mr Brain Wo Contrast  Result Date: 10/21/2018 CLINICAL DATA:  Encephalopathy.  Cocaine overdose. EXAM: MRI HEAD WITHOUT CONTRAST TECHNIQUE: Multiplanar, multiecho pulse sequences of the brain and surrounding structures were obtained without intravenous contrast. COMPARISON:  Head CT 04/03/2016 FINDINGS: The study is mildly motion degraded. Brain: There is no acute sizable infarct. Motion and image noise on diffusion sequences limits assessment for smaller infarcts. There is a 2 mm focus of increased trace diffusion signal in the subcortical right frontal lobe on coronal imaging (series 5, image 40) which is not confirmed on axial imaging. More subtle punctate foci of slightly increased signal elsewhere on predominantly coronal diffusion imaging is attributed to image noise. No intracranial hemorrhage, mass, midline shift, or extra-axial fluid collection is identified. The ventricles are normal. Patchy T2 hyperintensities in the cerebral white matter bilaterally, greatest in the frontal lobes, are nonspecific but compatible with chronic small vessel ischemic disease, mild-to-moderate for age. There is a small chronic cortical infarct in the superior right occipital lobe which is unchanged. Vascular: Major intracranial vascular flow voids are preserved. Skull and upper cervical spine: Unremarkable bone marrow signal. Sinuses/Orbits: Unremarkable orbits. Paranasal sinuses and mastoid air cells are clear. Other:  None. IMPRESSION: 1. Motion degraded examination. 2. Questionable punctate acute infarct versus artifact in the right frontal lobe. 3. No other evidence of acute intracranial abnormality. 4. Mild-to-moderate chronic small vessel ischemic disease. Electronically Signed   By: Logan Bores M.D.   On: 10/21/2018 11:34    Lab Data:  CBC: Recent Labs  Lab 10/18/18 1513 10/19/18 0230 10/20/18 0307 10/21/18 0304  WBC 10.0 8.9 6.0 7.1  HGB 13.0 12.5 12.0 12.2  HCT 43.7 41.7 41.5 41.7  MCV 79.5* 77.8* 78.0* 77.7*  PLT 249 279 256 638   Basic Metabolic Panel: Recent Labs  Lab 10/18/18 1513 10/18/18 1711 10/19/18 0228 10/19/18 0230 10/20/18 0307 10/21/18 0304  NA 135  --   --  138 137 136  K 3.3*  --   --  3.4* 3.7 3.7  CL 102  --   --  107 107 107  CO2 22  --   --  24 23 21*  GLUCOSE 148*  --   --  118* 114* 88  BUN 8  --   --  6 6 8   CREATININE 0.57  --   --  0.45 0.58 0.55  CALCIUM 8.5*  --   --  8.8* 8.5* 8.5*  MG  --  2.2 2.1  --   --   --   PHOS  --   --  3.1  --   --   --    GFR: Estimated Creatinine Clearance: 91 mL/min (by C-G formula based on SCr of 0.55 mg/dL). Liver Function Tests: Recent Labs  Lab 10/18/18 1513 10/19/18 0230  AST 27 25  ALT 23 22  ALKPHOS 96 91  BILITOT 0.9 1.1  PROT 8.4* 7.8  ALBUMIN 3.8 3.5   No results for input(s): LIPASE, AMYLASE in the last 168 hours. No results for input(s): AMMONIA in the last 168 hours. Coagulation Profile: No results for input(s): INR, PROTIME in the last 168 hours. Cardiac Enzymes: No results for input(s): CKTOTAL, CKMB, CKMBINDEX, TROPONINI in the last 168 hours. BNP (last 3 results) No results for input(s): PROBNP in the last 8760 hours. HbA1C: No results for input(s): HGBA1C in the last 72 hours. CBG: No results for input(s): GLUCAP in the last 168 hours. Lipid Profile: No results for input(s):  CHOL, HDL, LDLCALC, TRIG, CHOLHDL, LDLDIRECT in the last 72 hours. Thyroid Function Tests: Recent Labs     10/19/18 1420  TSH 0.996   Anemia Panel: No results for input(s): VITAMINB12, FOLATE, FERRITIN, TIBC, IRON, RETICCTPCT in the last 72 hours. Urine analysis:    Component Value Date/Time   COLORURINE YELLOW 12/13/2016 0835   APPEARANCEUR HAZY (A) 12/13/2016 0835   LABSPEC 1.018 12/13/2016 0835   PHURINE 7.0 12/13/2016 0835   GLUCOSEU NEGATIVE 12/13/2016 0835   HGBUR NEGATIVE 12/13/2016 0835   BILIRUBINUR NEGATIVE 12/13/2016 0835   KETONESUR 5 (A) 12/13/2016 0835   PROTEINUR NEGATIVE 12/13/2016 0835   NITRITE NEGATIVE 12/13/2016 0835   LEUKOCYTESUR NEGATIVE 12/13/2016 0835     Ripudeep Rai M.D. [facility] 10/21/2018, 12:40 PM  Pager: [phone] Between 7am to 7pm - call Pager - [phone]  After 7pm go to [email or web address] - password [email or web address]  Call night coverage person covering after 7pm

## 2018-10-21 NOTE — ED Notes (Signed)
Pt refused medication before leaving. Was unable to comprehend pts stated reason for refusing the mediation.

## 2018-10-21 NOTE — Discharge Instructions (Signed)
It is unclear if you had a seizure today.  If you develop severe worsening headache, vomiting, shaking, weakness or numbness, or any other new/concerning symptoms then return to the ER for evaluation.  You need to follow-up with a neurologist.  Until then you may not drive, operate heavy machinery, swim by herself, bathe in a bathtub by herself, climb ladders, or perform any other activities that may cause personal or other harm if he had a seizure.

## 2018-10-21 NOTE — Progress Notes (Signed)
Patient found walking in the hallway with sitter, pt stating that she is wanting to leave the hospital to smoke a cigarette. Patient was reminded that she has a nicotine patch on and that she is unable to leave due to IVC status. Patient became very agitated with staff; multiple nurses attempted to redirect patient and assisted her back to her room. CIWA assessment performed and patient's CIWA score was 12. Ativan was administered per CIWA protocol. Patient continued to be agitated, verbally aggressive, and attempting to get in the elevated. Security was notified and present to assist with patient. Patient's doctor (Dr. Tana Coast) also was notified and en route to assess patient. Will continue to monitor patient

## 2018-10-21 NOTE — ED Triage Notes (Signed)
Per EMS, Pt states that she had a seizure for apprx 15 seconds. Officer was present during event, stated the pt fell to the floor but never hit her head. No postictal state.

## 2018-10-21 NOTE — ED Notes (Signed)
Pt unable to sign for paperwork due to handcuffs. Police was given discharge paperwork.

## 2018-10-21 NOTE — ED Notes (Addendum)
Charge RN at bedside attempting to get bloodwork due to 2 failed attempts from primary RN and NT

## 2018-10-21 NOTE — ED Provider Notes (Signed)
[facility] DEPT Provider Note   CSN: [id number] Arrival date & time: 10/21/18  2004     History   Chief Complaint Chief Complaint  Patient presents with  . Seizures    HPI Holly Weber is a 53 y.o. female.  HPI  53 year old female presents with possible seizure. History is taken from patient, officers at bedside, and officer over the phone who witnessed the event.  The patient was discharged from here this afternoon.  While in jail being process she fell to the ground and started having shaking.  The officer that sought states that she was "twitching" and did not appear like a typical grand mal to him.  She woke up and was back to baseline fairly quickly.  Patient has been complaining of a headache and neck pain and back pain since falling.  She states she does have a history of seizures, does not get them often and has had them since 2006.  She is not on meds but is also not on any medicines.  Past Medical History:  Diagnosis Date  . Colitis, ulcerative ([place])   . Depression   . Hepatitis C   . Hypertension   . Stroke ([place])   . Thyroid disease     Patient Active Problem List   Diagnosis Date Noted  . Crack cocaine overdose ([place])   . Cocaine overdose ([place]) 10/18/2018  . Cellulitis 10/18/2018  . Hypokalemia 10/18/2018  . MSSA (methicillin susceptible Staphylococcus aureus) infection 12/27/2016  . Bacteremia 12/22/2016  . Polysubstance abuse ([place]) 12/22/2016  . Osteomyelitis ([place]) 12/22/2016  . Hyponatremia 12/22/2016  . Osteomyelitis of left hip ([place])   . Sacroiliitis ([place])   . Staphylococcus aureus bacteremia 12/15/2016  . Pneumonia 12/15/2016  . Hyperglycemia 12/15/2016  . Normocytic anemia 12/15/2016  . IV drug user 12/15/2016  . Acute left-sided low back pain with left-sided sciatica 12/13/2016  . Cocaine abuse ([place]) 12/13/2016  . Tobacco abuse 12/13/2016  . HTN (hypertension) 12/13/2016    Past Surgical History:  Procedure  Laterality Date  . ARM WOUND REPAIR / CLOSURE    . COLONOSCOPY    . OPEN REDUCTION INTERNAL FIXATION (ORIF) DISTAL RADIAL FRACTURE Left 01/07/2018   Procedure: OPEN REDUCTION INTERNAL FIXATION (ORIF) DISTAL RADIAL FRACTURE;  Surgeon: Leanora Cover, MD;  Location: [facility];  Service: Orthopedics;  Laterality: Left;  . TEE WITHOUT CARDIOVERSION N/A 12/17/2016   Procedure: TRANSESOPHAGEAL ECHOCARDIOGRAM (TEE);  Surgeon: Josue Hector, MD;  Location: [facility] ENDOSCOPY;  Service: Cardiovascular;  Laterality: N/A;  . TONSILLECTOMY       OB History   No obstetric history on file.      Home Medications    Prior to Admission medications   Medication Sig Start Date End Date Taking? Authorizing Provider  cephALEXin (KEFLEX) 500 MG capsule Take 2 capsules (1,000 mg total) by mouth 2 (two) times daily for 5 days. 10/21/18 10/26/18 Yes Rai, Ripudeep K, MD  nicotine (NICODERM CQ - DOSED IN MG/24 HOURS) 21 mg/24hr patch Place 1 patch (21 mg total) onto the skin daily. 10/21/18  Yes Rai, Vernelle Emerald, MD    Family History No family history on file.  Social History Social History   Tobacco Use  . Smoking status: Current Every Day Smoker    Packs/day: 1.50    Types: Cigarettes  . Smokeless tobacco: Never Used  Substance Use Topics  . Alcohol use: Yes    Comment: Daily   . Drug use:  Yes    Types: Cocaine, Marijuana    Comment: Daily on both     Allergies   Asa [aspirin]; Nsaids; and Penicillins   Review of Systems Review of Systems  Gastrointestinal: Negative for vomiting.  Musculoskeletal: Positive for back pain and neck pain.  Neurological: Positive for seizures and headaches.  All other systems reviewed and are negative.    Physical Exam Updated Vital Signs BP (!) 190/94 (BP Location: Right Arm) Comment: Simultaneous filing. User may not have seen previous data.  Pulse (!) 103 Comment: Simultaneous filing. User may not have seen previous data.  Temp 98.2 F (36.8 C)  (Oral)   Resp (!) 21 Comment: Simultaneous filing. User may not have seen previous data.  SpO2 93% Comment: Simultaneous filing. User may not have seen previous data.  Physical Exam Vitals signs and nursing note reviewed.  Constitutional:      Appearance: She is well-developed.     Interventions: Cervical collar in place.  HENT:     Head: Normocephalic and atraumatic.     Right Ear: External ear normal.     Left Ear: External ear normal.     Nose: Nose normal.  Eyes:     General:        Right eye: No discharge.        Left eye: No discharge.     Extraocular Movements: Extraocular movements intact.     Pupils: Pupils are equal, round, and reactive to light.  Neck:     Musculoskeletal: Muscular tenderness present.  Cardiovascular:     Rate and Rhythm: Normal rate and regular rhythm.     Heart sounds: Normal heart sounds.  Pulmonary:     Effort: Pulmonary effort is normal.     Breath sounds: Normal breath sounds.  Abdominal:     Palpations: Abdomen is soft.     Tenderness: There is no abdominal tenderness.  Musculoskeletal:     Thoracic back: She exhibits tenderness (mild). She exhibits no bony tenderness.       Back:  Skin:    General: Skin is warm and dry.  Neurological:     Mental Status: She is alert and oriented to person, place, and time.     Comments: CN 3-12 grossly intact. 5/5 strength in all 4 extremities. Grossly normal sensation. Normal finger to nose.   Psychiatric:        Mood and Affect: Mood is not anxious.      ED Treatments / Results  Labs (all labs ordered are listed, but only abnormal results are displayed) Labs Reviewed  COMPREHENSIVE METABOLIC PANEL - Abnormal; Notable for the following components:      Result Value   Potassium 3.4 (*)    Glucose, Bld 130 (*)    BUN 5 (*)    Total Protein 8.6 (*)    All other components within normal limits  CBC WITH DIFFERENTIAL/PLATELET - Abnormal; Notable for the following components:   RBC 5.47 (*)     MCV 77.0 (*)    MCH 23.6 (*)    RDW 15.9 (*)    All other components within normal limits    EKG EKG Interpretation  Date/Time:  Thursday October 21 2018 20:44:34 EST Ventricular Rate:  97 PR Interval:    QRS Duration: 111 QT Interval:  368 QTC Calculation: 468 R Axis:   19 Text Interpretation:  Sinus rhythm Probable left atrial enlargement RSR' in V1 or V2, right VCD or RVH no significant change since Oct 20 2018 Confirmed by Sherwood Gambler [phone]) on 10/21/2018 9:01:38 PM   Radiology Ct Head Wo Contrast  Result Date: 10/21/2018 CLINICAL DATA:  Trauma EXAM: CT HEAD WITHOUT CONTRAST CT CERVICAL SPINE WITHOUT CONTRAST TECHNIQUE: Multidetector CT imaging of the head and cervical spine was performed following the standard protocol without intravenous contrast. Multiplanar CT image reconstructions of the cervical spine were also generated. COMPARISON:  CT head 04/03/2016 FINDINGS: CT HEAD FINDINGS Brain: There is no mass, hemorrhage or extra-axial collection. The size and configuration of the ventricles and extra-axial CSF spaces are normal. The brain parenchyma is normal, without evidence of acute or chronic infarction. Vascular: No abnormal hyperdensity of the major intracranial arteries or dural venous sinuses. No intracranial atherosclerosis. Skull: The visualized skull base, calvarium and extracranial soft tissues are normal. Sinuses/Orbits: No fluid levels or advanced mucosal thickening of the visualized paranasal sinuses. No mastoid or middle ear effusion. The orbits are normal. CT CERVICAL SPINE FINDINGS Alignment: No static subluxation. Facets are aligned. Occipital condyles are normally positioned. Skull base and vertebrae: No acute fracture. Soft tissues and spinal canal: No prevertebral fluid or swelling. No visible canal hematoma. Disc levels: No advanced spinal canal or neural foraminal stenosis. Upper chest: No pneumothorax, pulmonary nodule or pleural effusion. Other: Normal  visualized paraspinal cervical soft tissues. IMPRESSION: 1. Normal head CT. 2. No fracture or static subluxation of the cervical spine. Electronically Signed   By: Ulyses Jarred M.D.   On: 10/21/2018 21:17   Ct Cervical Spine Wo Contrast  Result Date: 10/21/2018 CLINICAL DATA:  Trauma EXAM: CT HEAD WITHOUT CONTRAST CT CERVICAL SPINE WITHOUT CONTRAST TECHNIQUE: Multidetector CT imaging of the head and cervical spine was performed following the standard protocol without intravenous contrast. Multiplanar CT image reconstructions of the cervical spine were also generated. COMPARISON:  CT head 04/03/2016 FINDINGS: CT HEAD FINDINGS Brain: There is no mass, hemorrhage or extra-axial collection. The size and configuration of the ventricles and extra-axial CSF spaces are normal. The brain parenchyma is normal, without evidence of acute or chronic infarction. Vascular: No abnormal hyperdensity of the major intracranial arteries or dural venous sinuses. No intracranial atherosclerosis. Skull: The visualized skull base, calvarium and extracranial soft tissues are normal. Sinuses/Orbits: No fluid levels or advanced mucosal thickening of the visualized paranasal sinuses. No mastoid or middle ear effusion. The orbits are normal. CT CERVICAL SPINE FINDINGS Alignment: No static subluxation. Facets are aligned. Occipital condyles are normally positioned. Skull base and vertebrae: No acute fracture. Soft tissues and spinal canal: No prevertebral fluid or swelling. No visible canal hematoma. Disc levels: No advanced spinal canal or neural foraminal stenosis. Upper chest: No pneumothorax, pulmonary nodule or pleural effusion. Other: Normal visualized paraspinal cervical soft tissues. IMPRESSION: 1. Normal head CT. 2. No fracture or static subluxation of the cervical spine. Electronically Signed   By: Ulyses Jarred M.D.   On: 10/21/2018 21:17   Mr Brain Wo Contrast  Result Date: 10/21/2018 CLINICAL DATA:  Encephalopathy.  Cocaine  overdose. EXAM: MRI HEAD WITHOUT CONTRAST TECHNIQUE: Multiplanar, multiecho pulse sequences of the brain and surrounding structures were obtained without intravenous contrast. COMPARISON:  Head CT 04/03/2016 FINDINGS: The study is mildly motion degraded. Brain: There is no acute sizable infarct. Motion and image noise on diffusion sequences limits assessment for smaller infarcts. There is a 2 mm focus of increased trace diffusion signal in the subcortical right frontal lobe on coronal imaging (series 5, image 40) which is not confirmed on axial imaging. More subtle punctate foci of  slightly increased signal elsewhere on predominantly coronal diffusion imaging is attributed to image noise. No intracranial hemorrhage, mass, midline shift, or extra-axial fluid collection is identified. The ventricles are normal. Patchy T2 hyperintensities in the cerebral white matter bilaterally, greatest in the frontal lobes, are nonspecific but compatible with chronic small vessel ischemic disease, mild-to-moderate for age. There is a small chronic cortical infarct in the superior right occipital lobe which is unchanged. Vascular: Major intracranial vascular flow voids are preserved. Skull and upper cervical spine: Unremarkable bone marrow signal. Sinuses/Orbits: Unremarkable orbits. Paranasal sinuses and mastoid air cells are clear. Other: None. IMPRESSION: 1. Motion degraded examination. 2. Questionable punctate acute infarct versus artifact in the right frontal lobe. 3. No other evidence of acute intracranial abnormality. 4. Mild-to-moderate chronic small vessel ischemic disease. Electronically Signed   By: Logan Bores M.D.   On: 10/21/2018 11:34    Procedures Procedures (including critical care time)  Medications Ordered in ED Medications  potassium chloride SA (K-DUR,KLOR-CON) CR tablet 20 mEq (20 mEq Oral Refused 10/21/18 2315)  acetaminophen (TYLENOL) tablet 650 mg (650 mg Oral Refused 10/21/18 2315)     Initial  Impression / Assessment and Plan / ED Course  I have reviewed the triage vital signs and the nursing notes.  Pertinent labs & imaging results that were available during my care of the patient were reviewed by me and considered in my medical decision making (see chart for details).     No seizure-like activity here.  Patient reports a headache.  This appeared to be after she "fell".  Unclear if she truly fell or lowered herself to the ground.  Police report that her seizure did not appear like a true seizure but at this point, it is unclear.  Either way, CT head is benign, labs are overall reassuring besides mild hypokalemia, and she appears stable for outpatient management.  Her neck was injured when she fell but the CT is negative and I doubt ligamentous injury.  Discharged into police custody.  Final Clinical Impressions(s) / ED Diagnoses   Final diagnoses:  Shaking  Bad headache    ED Discharge Orders    None       Sherwood Gambler, MD 10/21/18 2337

## 2018-10-21 NOTE — Progress Notes (Signed)
Patient was witnessed attempting to get onto the elevator with IV access. Site removed. Will continue to monitor.

## 2018-10-21 NOTE — Discharge Summary (Addendum)
Physician Discharge Summary   Patient ID: RUDIE RIKARD MRN: [id number] DOB/AGE: May 08, 1966 53 y.o.  Admit date: 10/18/2018 Discharge date: 10/21/2018  Primary Care Physician:  Patient, No Pcp Per   Recommendations for Outpatient Follow-up:  1. Follow up with PCP in 1-2 weeks  Home Health: None Equipment/Devices:   Discharge Condition: stable CODE STATUS: FULL Diet recommendation: Heart healthy diet   Discharge Diagnoses:    . Cocaine overdose/intoxication ([place]) . Cellulitis . IV drug user . Polysubstance abuse ([place]) . Hypokalemia Acute metabolic encephalopathy Alcohol abuse  Consults: None    Allergies:   Allergies  Allergen Reactions  . Asa [Aspirin] Other (See Comments)    Ulcerative colitis flares.  . Nsaids Other (See Comments)    Ulcerative colitis flares  . Penicillins Rash and Other (See Comments)    Has patient had a PCN reaction causing immediate rash, facial/tongue/throat swelling, SOB or lightheadedness with hypotension:unsure Has patient had a PCN reaction causing severe rash involving mucus membranes or skin necrosis:unsure Has patient had a PCN reaction that required hospitalization:No Has patient had a PCN reaction occurring within the last 10 years:Yes If all of the above answers are "NO", then may proceed with Cephalosporin use.      DISCHARGE MEDICATIONS: Allergies as of 10/21/2018      Reactions   Asa [aspirin] Other (See Comments)   Ulcerative colitis flares.   Nsaids Other (See Comments)   Ulcerative colitis flares   Penicillins Rash, Other (See Comments)   Has patient had a PCN reaction causing immediate rash, facial/tongue/throat swelling, SOB or lightheadedness with hypotension:unsure Has patient had a PCN reaction causing severe rash involving mucus membranes or skin necrosis:unsure Has patient had a PCN reaction that required hospitalization:No Has patient had a PCN reaction occurring within the last 10 years:Yes If all of the  above answers are "NO", then may proceed with Cephalosporin use.      Medication List    STOP taking these medications   fluconazole 150 MG tablet Commonly known as:  DIFLUCAN     TAKE these medications   cephALEXin 500 MG capsule Commonly known as:  KEFLEX Take 2 capsules (1,000 mg total) by mouth 2 (two) times daily for 5 days.   nicotine 21 mg/24hr patch Commonly known as:  NICODERM CQ - dosed in mg/24 hours Place 1 patch (21 mg total) onto the skin daily.        Brief H and P: For complete details please refer to admission H and P, but in brief Patient is a 53 year old female medical history of depression, hypertension, CVA presented with shoulder pain but later stated that she may have swallowed crack while being detained. Patient states she swallowed crack in a ball which was about 3.5 g as she was detained by the police. Patient states she uses crack every day and usually uses about 3 g throughout the day. Patient swallowed a crack so the police would not get it. Patient seen in the ED poison control was contacted who recommended administration of charcoal but patient refused. Patient admitted to the stepdown unit for closer monitoring and placed on IV Ativan.  Hospital Course:   Crack cocaine overdose/intoxication [facility]) -Patient noted to have ingested crack cocaine in an attempt to avoid jail time while she was being detained by the police.  Otherwise denies any suicidal ideation -Poison control was contacted who recommended charcoal but patient refused -Patient was placed on clonidine detox protocol with Ativan -EKG showed no acute ST-T wave  changes -Patient was IVCD on 1/29 overnight -Patient remained very drowsy in the last 24 hours, this morning arousable, questionable right-sided droop, since no ball of cocaine was seen in the bowel movement, concern for stroke, MRI of the brain was done. -MRI of the brain showed questionable punctate infarct in the right frontal  lobe versus artifact.  Discussed with neurology, Dr Leonel Ramsay, who reviewed the MRI results and felt it is likely an artifact, does not change management as it is precipitated by acute cocaine intoxication even if it was a tiny punctate infarct.  No need of full stroke work-up. -Given patient has a prior history of stroke, would benefit from aspirin 81 mg daily however patient is allergic. -Patient is now alert, awake and oriented and was trying to leave AMA, escaping into the elevator with her IV access.   Cellulitis -Likely due to skin popping, continue Keflex  Acute metabolic encephalopathy -Likely due to #1, now alert and oriented, at baseline.  Patient was witnessed attempting to escape into the elevator with IV access.  Social work and [employer] was called.  Patient was cleared medically to be discharged with [facility].  IV drug use/ Polysubstance abuse -Patient with skin popping administering crack cocaine under the skin resulting in multiple areas of localized skin cellulitis, currently on Keflex  Alcohol abuse -Patient was placed on CIWA with Ativan but has not required Ativan in the last 24 hours  Hypokalemia -Resolved  Hypertension BP readings were elevated, patient was on clonidine protocol due to cocaine intoxication Recommend BP check outpatient and initiate antihypertensive if needed  Day of Discharge S: Patient examined, currently alert and oriented, back to baseline, talking on the phone.  Denies any complaints.  BP (!) 143/79 (BP Location: Right Arm)   Pulse 88   Temp 98 F (36.7 C) (Oral)   Resp 18   Ht 5' 5.5" (1.664 m)   Wt 85.9 kg   SpO2 95%   BMI 31.03 kg/m   Physical Exam: General: Alert and awake oriented x3 not in any acute distress. HEENT: anicteric sclera, pupils reactive to light and accommodation CVS: S1-S2 clear no murmur rubs or gallops Chest: clear to auscultation bilaterally, no wheezing rales or rhonchi Abdomen: soft  nontender, nondistended, normal bowel sounds Extremities: no cyanosis, clubbing or edema noted bilaterally Neuro: Cranial nerves II-XII intact, no focal neurological deficits   The results of significant diagnostics from this hospitalization (including imaging, microbiology, ancillary and laboratory) are listed below for reference.      Procedures/Studies:  Mr Brain Wo Contrast  Result Date: 10/21/2018 CLINICAL DATA:  Encephalopathy.  Cocaine overdose. EXAM: MRI HEAD WITHOUT CONTRAST TECHNIQUE: Multiplanar, multiecho pulse sequences of the brain and surrounding structures were obtained without intravenous contrast. COMPARISON:  Head CT 04/03/2016 FINDINGS: The study is mildly motion degraded. Brain: There is no acute sizable infarct. Motion and image noise on diffusion sequences limits assessment for smaller infarcts. There is a 2 mm focus of increased trace diffusion signal in the subcortical right frontal lobe on coronal imaging (series 5, image 40) which is not confirmed on axial imaging. More subtle punctate foci of slightly increased signal elsewhere on predominantly coronal diffusion imaging is attributed to image noise. No intracranial hemorrhage, mass, midline shift, or extra-axial fluid collection is identified. The ventricles are normal. Patchy T2 hyperintensities in the cerebral white matter bilaterally, greatest in the frontal lobes, are nonspecific but compatible with chronic small vessel ischemic disease, mild-to-moderate for age. There is a small chronic  cortical infarct in the superior right occipital lobe which is unchanged. Vascular: Major intracranial vascular flow voids are preserved. Skull and upper cervical spine: Unremarkable bone marrow signal. Sinuses/Orbits: Unremarkable orbits. Paranasal sinuses and mastoid air cells are clear. Other: None. IMPRESSION: 1. Motion degraded examination. 2. Questionable punctate acute infarct versus artifact in the right frontal lobe. 3. No other  evidence of acute intracranial abnormality. 4. Mild-to-moderate chronic small vessel ischemic disease. Electronically Signed   By: Logan Bores M.D.   On: 10/21/2018 11:34      LAB RESULTS: Basic Metabolic Panel: Recent Labs  Lab 10/19/18 0228  10/20/18 0307 10/21/18 0304  NA  --    < > 137 136  K  --    < > 3.7 3.7  CL  --    < > 107 107  CO2  --    < > 23 21*  GLUCOSE  --    < > 114* 88  BUN  --    < > 6 8  CREATININE  --    < > 0.58 0.55  CALCIUM  --    < > 8.5* 8.5*  MG 2.1  --   --   --   PHOS 3.1  --   --   --    < > = values in this interval not displayed.   Liver Function Tests: Recent Labs  Lab 10/18/18 1513 10/19/18 0230  AST 27 25  ALT 23 22  ALKPHOS 96 91  BILITOT 0.9 1.1  PROT 8.4* 7.8  ALBUMIN 3.8 3.5   No results for input(s): LIPASE, AMYLASE in the last 168 hours. No results for input(s): AMMONIA in the last 168 hours. CBC: Recent Labs  Lab 10/20/18 0307 10/21/18 0304  WBC 6.0 7.1  HGB 12.0 12.2  HCT 41.5 41.7  MCV 78.0* 77.7*  PLT 256 258   Cardiac Enzymes: No results for input(s): CKTOTAL, CKMB, CKMBINDEX, TROPONINI in the last 168 hours. BNP: Invalid input(s): POCBNP CBG: No results for input(s): GLUCAP in the last 168 hours.    Disposition and Follow-up: Discharge Instructions    Diet - low sodium heart healthy   Complete by:  As directed    Increase activity slowly   Complete by:  As directed        DISPOSITION: With [facility] [employer]   DISCHARGE FOLLOW-UP Follow-up Information    [facility]. Schedule an appointment as soon as possible for a visit in 2 week(s).   Contact information: [address] [place] [address] [phone]           Time coordinating discharge:  35 minutes  Signed:   Estill Cotta M.D. [facility] 10/21/2018, 4:51 PM Pager: [phone]

## 2018-10-21 NOTE — Progress Notes (Signed)
Patient taken to MR of head. Alert ,no right side droop of smile noted. Report called to nurse for 1412.Sitter remains with patient.

## 2018-10-21 NOTE — Evaluation (Addendum)
Clinical/Bedside Swallow Evaluation Patient Details  Name: DONYELLE ENYEART MRN: [id number] Date of Birth: 17-Jul-1966  Today's Date: 10/21/2018 Time: SLP Start Time (ACUTE ONLY): 1435 SLP Stop Time (ACUTE ONLY): 1500 SLP Time Calculation (min) (ACUTE ONLY): 25 min  Past Medical History:  Past Medical History:  Diagnosis Date  . Colitis, ulcerative ([place])   . Depression   . Hepatitis C   . Hypertension   . Stroke ([place])   . Thyroid disease    Past Surgical History:  Past Surgical History:  Procedure Laterality Date  . ARM WOUND REPAIR / CLOSURE    . COLONOSCOPY    . OPEN REDUCTION INTERNAL FIXATION (ORIF) DISTAL RADIAL FRACTURE Left 01/07/2018   Procedure: OPEN REDUCTION INTERNAL FIXATION (ORIF) DISTAL RADIAL FRACTURE;  Surgeon: Leanora Cover, MD;  Location: [place];  Service: Orthopedics;  Laterality: Left;  . TEE WITHOUT CARDIOVERSION N/A 12/17/2016   Procedure: TRANSESOPHAGEAL ECHOCARDIOGRAM (TEE);  Surgeon: Josue Hector, MD;  Location: [facility] ENDOSCOPY;  Service: Cardiovascular;  Laterality: N/A;  . TONSILLECTOMY     HPI:  Pt presents to [facility] after a cocaine overdose.  Medical history significant for UC, depression, HTN, and CVA.    Assessment / Plan / Recommendation Clinical Impression  Pt appears with swallowing abilities WFL characterized by adequate oral manipulation and no overt s/sx of aspiration/penetration with thin liquids or solids.  She presented with a breathy, hoarse vocal quality with low intensity throughout the evaluation, however the etiology does not appear related to her swallowing abilities. Her oral motor exam was [facility]. She coughed x3 at the end of the evaluation but this does not appear to be related to her swallowing, as nurse reported congestion today and that she has been coughing.    Pt did report as SLPs were leaving the room issues with proximal esophageal dysphagia *stating sometimes things lodge pointing to upper esophagus and states liquids  may help to clear at times. She taps her upper chest region to facilitate clearance.  Given this is reported to be a chronic issue, SLP recommended she follow up with GI MD as OP.       Aspiration Risk  No limitations    Diet Recommendation Regular;Thin liquid   Liquid Administration via: Cup Medication Administration: Whole meds with liquid Supervision: Patient able to self feed Postural Changes: Seated upright at 90 degrees    Other  Recommendations   If she is out of breath, take a break.    Follow up Recommendations None      Frequency and Duration   None         Prognosis Prognosis for Safe Diet Advancement: Good      Swallow Study   General Date of Onset: 10/18/18 HPI: Pt presents to [facility] after a cocaine overdose.  Medical history significant for UC, depression, HTN, and CVA.  Type of Study: Bedside Swallow Evaluation Previous Swallow Assessment: None  Diet Prior to this Study: Regular;Thin liquids Temperature Spikes Noted: No Respiratory Status: Room air History of Recent Intubation: No Behavior/Cognition: Lethargic/Drowsy;Cooperative Oral Cavity Assessment: Within Functional Limits Oral Care Completed by SLP: No Oral Cavity - Dentition: Poor condition;Missing dentition Vision: Functional for self-feeding Self-Feeding Abilities: Able to feed self Patient Positioning: Upright in bed Baseline Vocal Quality: Breathy;Hoarse;Low vocal intensity Volitional Cough: Weak    Oral/Motor/Sensory Function Overall Oral Motor/Sensory Function: Within functional limits(mild left side facial droop; isolated muscle movements symmetrical and WFL)   Ice Chips   NT  Thin Liquid Thin  Liquid: Within functional limits Presentation: Cup Other Comments: Performed 3 oz water swallow test.  Patient did not show overt s/sx of aspiration/pentration.     Nectar Thick   NT  Honey Thick   NT  Puree   NT  Solid     Solid: Within functional limits Presentation: Self Fed Other Comments:  Took three bites of cracker.  Proper mastication and appeared to have timely swallow. Did not show overt s/sx of aspiration/pentration.       Kaitlynn Plaskett 10/21/2018,3:26 PM   Jerlyn Ly, B.A.  Graduate Student Clinician  Luanna Salk, [facility] SLP Acute Rehab Services Pager [phone] Office [phone]

## 2018-10-21 NOTE — Progress Notes (Signed)
CSW received signed copy of form used to rescind IVC from the [place] staff, signed by the provider in the presence of the [facility] and faxed rescind from to the magistrate.  Please reconsult if future social work needs arise.  CSW signing off, as social work intervention is no longer needed.  Alphonse Guild. Zackary Mckeone, LCSW, LCAS, CSI Clinical Social Worker Ph: [phone]

## 2018-10-21 NOTE — Progress Notes (Signed)
Clinical Social Worker was consulted to resend patients IVC. CSW had MD sign paperwork to resend IVC. CSW faxed paperwork to magistrates office. CSW signing off at this time as social work needs have been meet.   Rhea Pink, MSW,  [place]

## 2020-06-01 ENCOUNTER — Telehealth: Payer: Self-pay | Admitting: Hematology and Oncology

## 2020-06-01 NOTE — Telephone Encounter (Signed)
Holly Weber has been rescheduled to see Dr. Lorenso Courier to 10/6 at 1pm.

## 2020-06-07 ENCOUNTER — Telehealth: Payer: Self-pay | Admitting: Hematology and Oncology

## 2020-06-07 NOTE — Telephone Encounter (Signed)
I cld and spoke to Ms. Glennon Mac, SW from [facility] [facility]) to inform her Ms. Boschert has been rescheduled to see Dr. Lorenso Courier on 10/6 at 1pm.

## 2020-06-25 ENCOUNTER — Ambulatory Visit: Payer: Self-pay | Admitting: Hematology and Oncology

## 2020-06-25 ENCOUNTER — Other Ambulatory Visit: Payer: Self-pay

## 2020-06-27 ENCOUNTER — Inpatient Hospital Stay: Payer: Self-pay | Attending: Hematology and Oncology | Admitting: Hematology and Oncology

## 2020-06-27 ENCOUNTER — Inpatient Hospital Stay: Payer: Self-pay

## 2020-07-11 ENCOUNTER — Telehealth: Payer: Self-pay | Admitting: Hematology and Oncology

## 2020-07-11 NOTE — Telephone Encounter (Signed)
I received a call from the pt's social worker to reschedule her appt to see Dr. Lorenso Courier on 11/12 at [place].

## 2020-07-13 ENCOUNTER — Inpatient Hospital Stay (HOSPITAL_COMMUNITY): Payer: Medicaid Other

## 2020-07-13 ENCOUNTER — Inpatient Hospital Stay (HOSPITAL_COMMUNITY)
Admission: EM | Admit: 2020-07-13 | Discharge: 2020-07-14 | DRG: 918 | Disposition: A | Discharge status: Home / Self Care | Payer: Medicaid Other | Attending: Internal Medicine | Admitting: Internal Medicine

## 2020-07-13 ENCOUNTER — Emergency Department (HOSPITAL_COMMUNITY): Payer: Medicaid Other

## 2020-07-13 ENCOUNTER — Observation Stay (HOSPITAL_COMMUNITY): Payer: Medicaid Other

## 2020-07-13 ENCOUNTER — Other Ambulatory Visit: Payer: Self-pay

## 2020-07-13 ENCOUNTER — Inpatient Hospital Stay (HOSPITAL_COMMUNITY)
Admit: 2020-07-13 | Discharge: 2020-07-13 | Disposition: A | Discharge status: Home / Self Care | Payer: Medicaid Other | Attending: Internal Medicine | Admitting: Internal Medicine

## 2020-07-13 ENCOUNTER — Encounter (HOSPITAL_COMMUNITY): Payer: Self-pay | Admitting: Emergency Medicine

## 2020-07-13 DIAGNOSIS — E039 Hypothyroidism, unspecified: Secondary | ICD-10-CM | POA: Diagnosis present

## 2020-07-13 DIAGNOSIS — F17210 Nicotine dependence, cigarettes, uncomplicated: Secondary | ICD-10-CM | POA: Diagnosis present

## 2020-07-13 DIAGNOSIS — Z8673 Personal history of transient ischemic attack (TIA), and cerebral infarction without residual deficits: Secondary | ICD-10-CM | POA: Diagnosis not present

## 2020-07-13 DIAGNOSIS — R569 Unspecified convulsions: Secondary | ICD-10-CM

## 2020-07-13 DIAGNOSIS — Z20822 Contact with and (suspected) exposure to covid-19: Secondary | ICD-10-CM | POA: Diagnosis present

## 2020-07-13 DIAGNOSIS — T405X1A Poisoning by cocaine, accidental (unintentional), initial encounter: Secondary | ICD-10-CM | POA: Diagnosis present

## 2020-07-13 DIAGNOSIS — G40409 Other generalized epilepsy and epileptic syndromes, not intractable, without status epilepticus: Secondary | ICD-10-CM | POA: Diagnosis present

## 2020-07-13 DIAGNOSIS — F1910 Other psychoactive substance abuse, uncomplicated: Secondary | ICD-10-CM | POA: Diagnosis present

## 2020-07-13 DIAGNOSIS — C140 Malignant neoplasm of pharynx, unspecified: Secondary | ICD-10-CM | POA: Diagnosis present

## 2020-07-13 DIAGNOSIS — C7B8 Other secondary neuroendocrine tumors: Secondary | ICD-10-CM | POA: Diagnosis present

## 2020-07-13 DIAGNOSIS — C3491 Malignant neoplasm of unspecified part of right bronchus or lung: Secondary | ICD-10-CM | POA: Diagnosis present

## 2020-07-13 DIAGNOSIS — C7A8 Other malignant neuroendocrine tumors: Secondary | ICD-10-CM | POA: Diagnosis present

## 2020-07-13 DIAGNOSIS — E0590 Thyrotoxicosis, unspecified without thyrotoxic crisis or storm: Secondary | ICD-10-CM | POA: Diagnosis present

## 2020-07-13 DIAGNOSIS — R4182 Altered mental status, unspecified: Secondary | ICD-10-CM | POA: Diagnosis present

## 2020-07-13 DIAGNOSIS — Z515 Encounter for palliative care: Secondary | ICD-10-CM

## 2020-07-13 DIAGNOSIS — I10 Essential (primary) hypertension: Secondary | ICD-10-CM | POA: Diagnosis present

## 2020-07-13 DIAGNOSIS — R739 Hyperglycemia, unspecified: Secondary | ICD-10-CM | POA: Diagnosis present

## 2020-07-13 DIAGNOSIS — Z9114 Patient's other noncompliance with medication regimen: Secondary | ICD-10-CM

## 2020-07-13 DIAGNOSIS — Z79899 Other long term (current) drug therapy: Secondary | ICD-10-CM

## 2020-07-13 DIAGNOSIS — I674 Hypertensive encephalopathy: Secondary | ICD-10-CM

## 2020-07-13 DIAGNOSIS — Z720 Tobacco use: Secondary | ICD-10-CM | POA: Diagnosis present

## 2020-07-13 DIAGNOSIS — J189 Pneumonia, unspecified organism: Secondary | ICD-10-CM

## 2020-07-13 DIAGNOSIS — F419 Anxiety disorder, unspecified: Secondary | ICD-10-CM | POA: Diagnosis present

## 2020-07-13 DIAGNOSIS — C7931 Secondary malignant neoplasm of brain: Secondary | ICD-10-CM | POA: Diagnosis present

## 2020-07-13 DIAGNOSIS — K5190 Ulcerative colitis, unspecified, without complications: Secondary | ICD-10-CM | POA: Diagnosis present

## 2020-07-13 LAB — CBC WITH DIFFERENTIAL/PLATELET
Abs Immature Granulocytes: 0.09 10*3/uL — ABNORMAL HIGH (ref 0.00–0.07)
Basophils Absolute: 0.1 10*3/uL (ref 0.0–0.1)
Basophils Relative: 1 %
Eosinophils Absolute: 0.8 10*3/uL — ABNORMAL HIGH (ref 0.0–0.5)
Eosinophils Relative: 6 %
HCT: 48.5 % — ABNORMAL HIGH (ref 36.0–46.0)
Hemoglobin: 15.5 g/dL — ABNORMAL HIGH (ref 12.0–15.0)
Immature Granulocytes: 1 %
Lymphocytes Relative: 24 %
Lymphs Abs: 3.2 10*3/uL (ref 0.7–4.0)
MCH: 26.1 pg (ref 26.0–34.0)
MCHC: 32 g/dL (ref 30.0–36.0)
MCV: 81.6 fL (ref 80.0–100.0)
Monocytes Absolute: 1.2 10*3/uL — ABNORMAL HIGH (ref 0.1–1.0)
Monocytes Relative: 9 %
Neutro Abs: 7.9 10*3/uL — ABNORMAL HIGH (ref 1.7–7.7)
Neutrophils Relative %: 59 %
Platelets: 257 10*3/uL (ref 150–400)
RBC: 5.94 MIL/uL — ABNORMAL HIGH (ref 3.87–5.11)
RDW: 14.8 % (ref 11.5–15.5)
WBC: 13.4 10*3/uL — ABNORMAL HIGH (ref 4.0–10.5)
nRBC: 0 % (ref 0.0–0.2)

## 2020-07-13 LAB — URINALYSIS, ROUTINE W REFLEX MICROSCOPIC
Bilirubin Urine: NEGATIVE
Glucose, UA: NEGATIVE mg/dL
Hgb urine dipstick: NEGATIVE
Ketones, ur: 5 mg/dL — AB
Leukocytes,Ua: NEGATIVE
Nitrite: NEGATIVE
Protein, ur: 30 mg/dL — AB
Specific Gravity, Urine: 1.016 (ref 1.005–1.030)
pH: 5 (ref 5.0–8.0)

## 2020-07-13 LAB — TROPONIN I (HIGH SENSITIVITY)
Troponin I (High Sensitivity): 4 ng/L (ref <18)
Troponin I (High Sensitivity): 5 ng/L (ref <18)

## 2020-07-13 LAB — COMPREHENSIVE METABOLIC PANEL
ALT: 27 U/L (ref 0–44)
AST: 31 U/L (ref 15–41)
Albumin: 3.3 g/dL — ABNORMAL LOW (ref 3.5–5.0)
Alkaline Phosphatase: 127 U/L — ABNORMAL HIGH (ref 38–126)
Anion gap: 7 (ref 5–15)
BUN: 10 mg/dL (ref 6–20)
CO2: 23 mmol/L (ref 22–32)
Calcium: 8.9 mg/dL (ref 8.9–10.3)
Chloride: 106 mmol/L (ref 98–111)
Creatinine, Ser: 0.54 mg/dL (ref 0.44–1.00)
GFR, Estimated: >60 mL/min (ref >60)
Glucose, Bld: 195 mg/dL — ABNORMAL HIGH (ref 70–99)
Potassium: 4.1 mmol/L (ref 3.5–5.1)
Sodium: 136 mmol/L (ref 135–145)
Total Bilirubin: 1.9 mg/dL — ABNORMAL HIGH (ref 0.3–1.2)
Total Protein: 7.4 g/dL (ref 6.5–8.1)

## 2020-07-13 LAB — I-STAT CHEM 8, ED
BUN: 8 mg/dL (ref 6–20)
Calcium, Ion: 1.09 mmol/L — ABNORMAL LOW (ref 1.15–1.40)
Chloride: 106 mmol/L (ref 98–111)
Creatinine, Ser: 0.4 mg/dL — ABNORMAL LOW (ref 0.44–1.00)
Glucose, Bld: 229 mg/dL — ABNORMAL HIGH (ref 70–99)
HCT: 49 % — ABNORMAL HIGH (ref 36.0–46.0)
Hemoglobin: 16.7 g/dL — ABNORMAL HIGH (ref 12.0–15.0)
Potassium: 4.7 mmol/L (ref 3.5–5.1)
Sodium: 137 mmol/L (ref 135–145)
TCO2: 23 mmol/L (ref 22–32)

## 2020-07-13 LAB — PREGNANCY, URINE: Preg Test, Ur: NEGATIVE

## 2020-07-13 LAB — RAPID URINE DRUG SCREEN, HOSP PERFORMED
Amphetamines: NOT DETECTED
Barbiturates: NOT DETECTED
Benzodiazepines: NOT DETECTED
Cocaine: POSITIVE — AB
Opiates: NOT DETECTED
Tetrahydrocannabinol: NOT DETECTED

## 2020-07-13 LAB — RESPIRATORY PANEL BY RT PCR (FLU A&B, COVID)
Influenza A by PCR: NEGATIVE
Influenza B by PCR: NEGATIVE
SARS Coronavirus 2 by RT PCR: NEGATIVE

## 2020-07-13 LAB — HIV ANTIBODY (ROUTINE TESTING W REFLEX): HIV Screen 4th Generation wRfx: NONREACTIVE

## 2020-07-13 LAB — GLUCOSE, CAPILLARY: Glucose-Capillary: 120 mg/dL — ABNORMAL HIGH (ref 70–99)

## 2020-07-13 MED ORDER — LEVETIRACETAM IN NACL 500 MG/100ML IV SOLN
500.0000 mg | Freq: Two times a day (BID) | INTRAVENOUS | Status: DC
  Administered 2020-07-13 – 2020-07-14 (×3): 500 mg via INTRAVENOUS
  Filled 2020-07-13 (×4): qty 100

## 2020-07-13 MED ORDER — ENOXAPARIN SODIUM 40 MG/0.4ML ~~LOC~~ SOLN
40.0000 mg | SUBCUTANEOUS | Status: DC
  Administered 2020-07-13 – 2020-07-14 (×2): 40 mg via SUBCUTANEOUS
  Filled 2020-07-13 (×2): qty 0.4

## 2020-07-13 MED ORDER — SODIUM CHLORIDE 0.9 % IV BOLUS
500.0000 mL | Freq: Once | INTRAVENOUS | Status: AC
  Administered 2020-07-13: 500 mL via INTRAVENOUS

## 2020-07-13 MED ORDER — ONDANSETRON HCL 4 MG/2ML IJ SOLN: 4.0000 mg | Freq: Four times a day (QID) | INTRAMUSCULAR | Status: DC | PRN

## 2020-07-13 MED ORDER — POLYETHYLENE GLYCOL 3350 17 G PO PACK: 17.0000 g | PACK | Freq: Every day | ORAL | Status: DC | PRN

## 2020-07-13 MED ORDER — INSULIN ASPART 100 UNIT/ML ~~LOC~~ SOLN
0.0000 [IU] | Freq: Every day | SUBCUTANEOUS | Status: DC
  Filled 2020-07-13: qty 0.05

## 2020-07-13 MED ORDER — DOCUSATE SODIUM 100 MG PO CAPS
100.0000 mg | ORAL_CAPSULE | Freq: Two times a day (BID) | ORAL | Status: DC
  Administered 2020-07-13 – 2020-07-14 (×3): 100 mg via ORAL
  Filled 2020-07-13 (×3): qty 1

## 2020-07-13 MED ORDER — INSULIN ASPART 100 UNIT/ML ~~LOC~~ SOLN
0.0000 [IU] | Freq: Three times a day (TID) | SUBCUTANEOUS | Status: DC
  Administered 2020-07-14: 3 [IU] via SUBCUTANEOUS
  Filled 2020-07-13: qty 0.15

## 2020-07-13 MED ORDER — LEVETIRACETAM IN NACL 1500 MG/100ML IV SOLN
1500.0000 mg | Freq: Once | INTRAVENOUS | Status: AC
  Administered 2020-07-13: 1500 mg via INTRAVENOUS
  Filled 2020-07-13: qty 100

## 2020-07-13 MED ORDER — SODIUM CHLORIDE 0.9 % IV SOLN
75.0000 mL/h | INTRAVENOUS | Status: DC
  Administered 2020-07-13: 75 mL/h via INTRAVENOUS

## 2020-07-13 MED ORDER — ACETAMINOPHEN 325 MG PO TABS
650.0000 mg | ORAL_TABLET | ORAL | Status: DC | PRN
  Administered 2020-07-14: 650 mg via ORAL
  Filled 2020-07-13: qty 2

## 2020-07-13 MED ORDER — HYDRALAZINE HCL 20 MG/ML IJ SOLN
5.0000 mg | Freq: Once | INTRAMUSCULAR | Status: AC
  Administered 2020-07-13: 5 mg via INTRAVENOUS
  Filled 2020-07-13: qty 1

## 2020-07-13 MED ORDER — LEVOFLOXACIN IN D5W 750 MG/150ML IV SOLN: 750.0000 mg | Freq: Once | INTRAVENOUS | Status: DC

## 2020-07-13 MED ORDER — LORAZEPAM 2 MG/ML IJ SOLN
1.0000 mg | INTRAMUSCULAR | Status: DC | PRN
  Administered 2020-07-13: 2 mg via INTRAVENOUS
  Filled 2020-07-13: qty 1

## 2020-07-13 MED ORDER — ONDANSETRON HCL 4 MG PO TABS: 4.0000 mg | ORAL_TABLET | Freq: Four times a day (QID) | ORAL | Status: DC | PRN

## 2020-07-13 MED ORDER — NICOTINE 14 MG/24HR TD PT24
14.0000 mg | MEDICATED_PATCH | Freq: Every day | TRANSDERMAL | Status: DC
  Administered 2020-07-14: 14 mg via TRANSDERMAL
  Filled 2020-07-13: qty 1

## 2020-07-13 MED ORDER — ACETAMINOPHEN 650 MG RE SUPP: 650.0000 mg | RECTAL | Status: DC | PRN

## 2020-07-13 NOTE — ED Notes (Signed)
EEG is on the way to see patient from [place].

## 2020-07-13 NOTE — ED Notes (Signed)
Placed purewick on patient/ repositioned/ dressed in hospital gown Patient is very confused

## 2020-07-13 NOTE — Progress Notes (Signed)
..   Transition of Care [facility]) - Emergency Department Mini Assessment   Patient Details  Name: Holly Weber MRN: [id number] Date of Birth: 11/27/1965  Transition of Care [facility]) CM/SW Contact:    Erenest Rasher, RN Phone Number: 07/13/2020, 12:02 PM   Clinical Narrative:  TOC CM referral to assist in getting medical records. ED Unit Secretary was able to send for medical records from [facility]. Pt will need PCP follow up and substance abuse counseling and possible medication assistance. TOC consult updated.   ED Mini Assessment: What brought you to the Emergency Department? : seizures  Barriers to Discharge: Continued Medical Work up        Interventions which prevented an admission or readmission: Other (must enter comment)    Patient Contact and Communications        ,                 Admission diagnosis:  Seizure [facility]) [R56.9] Patient Active Problem List   Diagnosis Date Noted  . Seizure ([place]) 07/13/2020  . Crack cocaine overdose ([place])   . Cocaine overdose ([place]) 10/18/2018  . Cellulitis 10/18/2018  . Hypokalemia 10/18/2018  . MSSA (methicillin susceptible Staphylococcus aureus) infection 12/27/2016  . Bacteremia 12/22/2016  . Polysubstance abuse ([place]) 12/22/2016  . Osteomyelitis ([place]) 12/22/2016  . Hyponatremia 12/22/2016  . Osteomyelitis of left hip ([place])   . Sacroiliitis ([place])   . Staphylococcus aureus bacteremia 12/15/2016  . Pneumonia 12/15/2016  . Hyperglycemia 12/15/2016  . Normocytic anemia 12/15/2016  . IV drug user 12/15/2016  . Acute left-sided low back pain with left-sided sciatica 12/13/2016  . Cocaine abuse ([place]) 12/13/2016  . Tobacco abuse 12/13/2016  . HTN (hypertension) 12/13/2016   PCP:  Patient, No Pcp Per Pharmacy:   [id number] [place], [address] [address]Phone: [phone]: [phone]

## 2020-07-13 NOTE — Progress Notes (Signed)
EEG completed, results pending. 

## 2020-07-13 NOTE — ED Notes (Signed)
Neurology MD bedside.

## 2020-07-13 NOTE — Consult Note (Addendum)
[place]  [phone] [phone]   MEDICAL ONCOLOGY - INITIAL CONSULTATION  Referral MD: Dr. Karmen Bongo  Reason for Referral: Metastatic carcinoma with neuroendocrine features   HPI: [name] is a 54 year old female with a past medical history significant for hypothyroidism, CVA, hypertension, polysubstance abuse, hepatitis C, ulcerative colitis, and recently diagnosed malignancy.  She was brought to the emergency room due to a seizure.  Seizure was witnessed by friends.   The patient was released from prison early on medical leave on 07/11/2020.  She was hospitalized a week ago at [facility] (no records available in care everywhere for review) that she was diagnosed with "5 cancers." She states that she was told she only had 2 weeks left to live and that she was discharged early to make the most of her time left.  She used crack cocaine last night with friends.  The patient was hospitalized for cocaine overdose in January 2020 and was discharged to jail and while being processed had seizure-like activity and was brought back to the emergency room.  At that time, she reported a history of intermittent seizures dating back to 2006.  Outside records were available in the ER and summarized below.  She was admitted to [facility] from 04/07/2020 through 04/18/2020.  The patient was brought due to shortness of breath, chest pain, and cough x6 to 8 weeks with swollen lymph nodes in her neck which were getting larger.  CTA chest showed extensive mediastinal and hilar as well as lower neck lymphadenopathy and she was found to have 2 masses in the right lung, one associated with the pleura and one more parenchymal.  She had a reported weight loss of approximately 60 pounds over a 1 year period of time.  The patient underwent an ultrasound-guided biopsy of the left supraclavicular lymph node on 04/09/2020 and pathology was consistent with metastatic carcinoma  with neuroendocrine features, Ki-67 60%, TTF-1 positive.  MRI of the brain showed findings suspicious for endocranial metastasis with at least 2 right hemispheric lesions up to 5 mm in the parietal lobe with no mass-effect or edema.  Bone scan showed no evidence of bony mets.  The patient received a cycle of chemotherapy starting on 04/14/2020 consisting of carboplatin and etoposide (doses not documented in the discharge summary) and was started on Granix 480 mcg subcu daily on 04/17/2020.  She was advised to follow-up in [facility] with medical and radiation oncology.  Is not clear if she followed up as advised.   Holly Weber has been scheduled to see [place] at the cancer center on several occasions but has rescheduled her appointments and has not yet established care.  Work-up so far this admission reveals a WBC of 13.4 hemoglobin 15.5, glucose 229, urine drug screen positive for cocaine.  Chest x-ray showed bilateral basilar infiltrates.  CT head without contrast showed no evidence of acute intracranial abnormality, small chronic cortical infarct within the paramedian right occipital lobe, mild cerebral white matter chronic small vessel ischemic disease.  Has been seen by neurology who recommends MRI of the brain with and without contrast, EEG, and Keppra.  MRI of the brain without contrast has been performed and results pending.   The patient was seen in the emergency room.  She just returned from MRI.  She received Ativan 2 mg IV.  She is still very sedated at the time my visit.  I attempted to awaken several times, but she did not respond to me.  History  has been taken from the chart.  According to outside records, she has a history of tobacco use but quit in March 2021.  We will need to verify with the patient when she is alert.  Medical oncology was asked see the patient for recommendations regarding her diagnosis of metastatic carcinoma with neuroendocrine features.   Past Medical History:  Diagnosis Date   . Colitis, ulcerative ([place])   . Depression   . Hepatitis C   . Hypertension   . Stroke ([place])   . Thyroid disease   :  Past Surgical History:  Procedure Laterality Date  . ARM WOUND REPAIR / CLOSURE    . COLONOSCOPY    . OPEN REDUCTION INTERNAL FIXATION (ORIF) DISTAL RADIAL FRACTURE Left 01/07/2018   Procedure: OPEN REDUCTION INTERNAL FIXATION (ORIF) DISTAL RADIAL FRACTURE;  Surgeon: Leanora Cover, MD;  Location: [place];  Service: Orthopedics;  Laterality: Left;  . TEE WITHOUT CARDIOVERSION N/A 12/17/2016   Procedure: TRANSESOPHAGEAL ECHOCARDIOGRAM (TEE);  Surgeon: Josue Hector, MD;  Location: [facility] ENDOSCOPY;  Service: Cardiovascular;  Laterality: N/A;  . TONSILLECTOMY    :  Current Facility-Administered Medications  Medication Dose Route Frequency Provider Last Rate Last Admin  . 0.9 %  sodium chloride infusion  75 mL/hr Intravenous Continuous Karmen Bongo, MD 75 mL/hr at 07/13/20 1108 75 mL/hr at 07/13/20 1108  . acetaminophen (TYLENOL) tablet 650 mg  650 mg Oral Q4H PRN Karmen Bongo, MD       Or  . acetaminophen (TYLENOL) suppository 650 mg  650 mg Rectal Q4H PRN Karmen Bongo, MD      . docusate sodium (COLACE) capsule 100 mg  100 mg Oral BID Karmen Bongo, MD   100 mg at 07/13/20 1106  . enoxaparin (LOVENOX) injection 40 mg  40 mg Subcutaneous Q24H Karmen Bongo, MD   40 mg at 07/13/20 1107  . levETIRAcetam (KEPPRA) IVPB 500 mg/100 mL premix  500 mg Intravenous Q12H Marliss Coots, PA-C 400 mL/hr at 07/13/20 1110 500 mg at 07/13/20 1110  . LORazepam (ATIVAN) injection 1-2 mg  1-2 mg Intravenous Q2H PRN Karmen Bongo, MD   2 mg at 07/13/20 1114  . ondansetron (ZOFRAN) tablet 4 mg  4 mg Oral Q6H PRN Karmen Bongo, MD       Or  . ondansetron [facility]) injection 4 mg  4 mg Intravenous Q6H PRN Karmen Bongo, MD      . polyethylene glycol (MIRALAX / GLYCOLAX) packet 17 g  17 g Oral Daily PRN Karmen Bongo, MD       No current outpatient medications  on file.     Allergies  Allergen Reactions  . Asa [Aspirin] Other (See Comments)    Ulcerative colitis flares.  . Nsaids Other (See Comments)    Ulcerative colitis flares  . Penicillins Rash and Other (See Comments)    Has patient had a PCN reaction causing immediate rash, facial/tongue/throat swelling, SOB or lightheadedness with hypotension:unsure Has patient had a PCN reaction causing severe rash involving mucus membranes or skin necrosis:unsure Has patient had a PCN reaction that required hospitalization:No Has patient had a PCN reaction occurring within the last 10 years:Yes If all of the above answers are "NO", then may proceed with Cephalosporin use.   :  Family History  Problem Relation Age of Onset  . Hypertension Mother   . Hypertension Father   :  Social History   Socioeconomic History  . Marital status: Widowed    Spouse name: Not on  file  . Number of children: Not on file  . Years of education: Not on file  . Highest education level: Not on file  Occupational History  . Not on file  Tobacco Use  . Smoking status: Current Every Day Smoker    Packs/day: 1.50    Types: Cigarettes  . Smokeless tobacco: Never Used  Substance and Sexual Activity  . Alcohol use: Yes    Comment: Daily   . Drug use: Yes    Types: Cocaine, Marijuana    Comment: Daily on both  . Sexual activity: Yes    Birth control/protection: None  Other Topics Concern  . Not on file  Social History Narrative  . Not on file   Social Determinants of Health   Financial Resource Strain:   . Difficulty of Paying Living Expenses: Not on file  Food Insecurity:   . Worried About Charity fundraiser in the Last Year: Not on file  . Ran Out of Food in the Last Year: Not on file  Transportation Needs:   . Lack of Transportation (Medical): Not on file  . Lack of Transportation (Non-Medical): Not on file  Physical Activity:   . Days of Exercise per Week: Not on file  . Minutes of Exercise per  Session: Not on file  Stress:   . Feeling of Stress : Not on file  Social Connections:   . Frequency of Communication with Friends and Family: Not on file  . Frequency of Social Gatherings with Friends and Family: Not on file  . Attends Religious Services: Not on file  . Active Member of Clubs or Organizations: Not on file  . Attends Archivist Meetings: Not on file  . Marital Status: Not on file  Intimate Partner Violence:   . Fear of Current or Ex-Partner: Not on file  . Emotionally Abused: Not on file  . Physically Abused: Not on file  . Sexually Abused: Not on file  :  Review of Systems: Unable to obtain secondary to patient sedation.  Exam: Patient Vitals for the past 24 hrs:  BP Temp Temp src Pulse Resp SpO2 Height Weight  07/13/20 1109 (!) 143/85 98.2 F (36.8 C) Oral (!) 115 20 97 % -- --  07/13/20 1100 (!) 143/85 -- -- (!) 116 (!) 38 95 % -- --  07/13/20 1018 (!) 158/80 98.2 F (36.8 C) Oral (!) 113 (!) 37 94 % -- --  07/13/20 0933 (!) 151/93 98.2 F (36.8 C) Oral (!) 117 (!) 32 95 % -- --  07/13/20 0831 (!) 184/82 98.2 F (36.8 C) Oral (!) 116 (!) 34 95 % -- --  07/13/20 0612 -- -- -- -- -- 96 % -- --  07/13/20 0537 (!) 187/89 97.8 F (36.6 C) Oral (!) 120 (!) 25 94 % 5' 5"  (1.651 m) 77.1 kg   Exam limited secondary to patient's sedation.  General: Sedated, no distress Respiratory: lungs were clear bilaterally without wheezing or crackles.   Cardiovascular:  Regular rate and rhythm, S1/S2, without murmur, rub or gallop.  There was no pedal edema.   GI:  abdomen was soft, flat, nontender, nondistended, without organomegaly.    Skin exam was without echymosis, petichae.   Neuro: Sedated, does not awaken to voice or tactile stimulus   Lab Results  Component Value Date   WBC 13.4 (H) 07/13/2020   HGB 16.7 (H) 07/13/2020   HCT 49.0 (H) 07/13/2020   PLT 257 07/13/2020   GLUCOSE 195 (H)  07/13/2020   ALT 27 07/13/2020   AST 31 07/13/2020   NA 136  07/13/2020   K 4.1 07/13/2020   CL 106 07/13/2020   CREATININE 0.54 07/13/2020   BUN 10 07/13/2020   CO2 23 07/13/2020    CT Head Wo Contrast  Result Date: 07/13/2020 CLINICAL DATA:  Seizure, abnormal neuro exam. Additional history provided: Witness seizure EXAM: CT HEAD WITHOUT CONTRAST TECHNIQUE: Contiguous axial images were obtained from the base of the skull through the vertex without intravenous contrast. COMPARISON:  Prior head CT 10/21/2018.  Prior brain MRI 10/21/2018. FINDINGS: Brain: Cerebral volume is normal for age. Redemonstrated small chronic infarct within the superior aspect of the paramedian right occipital lobe (for instance as seen on series 2, image 19) (series 5, image 20). Mild ill-defined hypoattenuation within the cerebral white matter is nonspecific, but compatible with chronic small vessel ischemic disease. There is no acute intracranial hemorrhage. No acute demarcated cortical infarct. No extra-axial fluid collection. No evidence of intracranial mass. No midline shift. Vascular: No hyperdense vessel.  Atherosclerotic calcifications. Skull: Normal. Negative for fracture or focal lesion. Sinuses/Orbits: Visualized orbits show no acute finding. No significant paranasal sinus disease or mastoid effusion at the imaged levels. IMPRESSION: No evidence of acute intracranial abnormality. Redemonstrated small chronic cortical infarct within the paramedian right occipital lobe. Mild cerebral white matter chronic small vessel ischemic disease, stable as compared to the brain MRI of 10/21/2018. Electronically Signed   By: Kellie Simmering DO   On: 07/13/2020 07:53   DG Chest Portable 1 View  Result Date: 07/13/2020 CLINICAL DATA:  Seizures EXAM: PORTABLE CHEST 1 VIEW COMPARISON:  12/13/2016 FINDINGS: Shallow inspiration. Heart size is normal. Bilateral basilar infiltrates could represent pneumonia or edema. No pleural effusions. No pneumothorax. Right power port type central venous catheter  with tip over the low SVC region. IMPRESSION: Bilateral basilar infiltrates. Electronically Signed   By: Lucienne Capers M.D.   On: 07/13/2020 06:39   EEG adult  Result Date: 07/13/2020 Lora Havens, MD     07/13/2020 11:20 AM Patient Name: Holly Weber MRN: [id number] Epilepsy Attending: Lora Havens Referring Physician/Provider: Dr. Karmen Bongo Date: 07/12/2020 Duration: 23.07 minutes Patient history: 54 year old female presented to the hospital with seizures in the setting of cocaine use.  EEG to evaluate for seizures. Level of alertness: Awake, asleep AEDs during EEG study: LEV Technical aspects: This EEG study was done with scalp electrodes positioned according to the 10-20 International system of electrode placement. Electrical activity was acquired at a sampling rate of 500Hz  and reviewed with a high frequency filter of 70Hz  and a low frequency filter of 1Hz . EEG data were recorded continuously and digitally stored. Description: During awake state, no clear posterior dominant rhythm was seen.  Sleep was characterized by sleep spindles (12 to 14 Hz), maximal frontocentral region.  EEG showed continuous generalized polymorphic 5 to 6 Hz theta slowing and intermittent 2 to 3 Hz delta slowing. Hyperventilation and photic stimulation were not performed.   ABNORMALITY -Continuous slow, generalized IMPRESSION: This study is  suggestive of moderate diffuse encephalopathy, nonspecific etiology but likely related to sedation, post-ictal state. No seizures or definite epileptiform discharges were seen throughout the recording. Lora Havens     CT Head Wo Contrast  Result Date: 07/13/2020 CLINICAL DATA:  Seizure, abnormal neuro exam. Additional history provided: Witness seizure EXAM: CT HEAD WITHOUT CONTRAST TECHNIQUE: Contiguous axial images were obtained from the base of the skull through the vertex without intravenous contrast.  COMPARISON:  Prior head CT 10/21/2018.  Prior brain MRI  10/21/2018. FINDINGS: Brain: Cerebral volume is normal for age. Redemonstrated small chronic infarct within the superior aspect of the paramedian right occipital lobe (for instance as seen on series 2, image 19) (series 5, image 20). Mild ill-defined hypoattenuation within the cerebral white matter is nonspecific, but compatible with chronic small vessel ischemic disease. There is no acute intracranial hemorrhage. No acute demarcated cortical infarct. No extra-axial fluid collection. No evidence of intracranial mass. No midline shift. Vascular: No hyperdense vessel.  Atherosclerotic calcifications. Skull: Normal. Negative for fracture or focal lesion. Sinuses/Orbits: Visualized orbits show no acute finding. No significant paranasal sinus disease or mastoid effusion at the imaged levels. IMPRESSION: No evidence of acute intracranial abnormality. Redemonstrated small chronic cortical infarct within the paramedian right occipital lobe. Mild cerebral white matter chronic small vessel ischemic disease, stable as compared to the brain MRI of 10/21/2018. Electronically Signed   By: Kellie Simmering DO   On: 07/13/2020 07:53   DG Chest Portable 1 View  Result Date: 07/13/2020 CLINICAL DATA:  Seizures EXAM: PORTABLE CHEST 1 VIEW COMPARISON:  12/13/2016 FINDINGS: Shallow inspiration. Heart size is normal. Bilateral basilar infiltrates could represent pneumonia or edema. No pleural effusions. No pneumothorax. Right power port type central venous catheter with tip over the low SVC region. IMPRESSION: Bilateral basilar infiltrates. Electronically Signed   By: Lucienne Capers M.D.   On: 07/13/2020 06:39   EEG adult  Result Date: 07/13/2020 Lora Havens, MD     07/13/2020 11:20 AM Patient Name: Holly Weber MRN: [id number] Epilepsy Attending: Lora Havens Referring Physician/Provider: Dr. Karmen Bongo Date: 07/12/2020 Duration: 23.07 minutes Patient history: 54 year old female presented to the hospital with  seizures in the setting of cocaine use.  EEG to evaluate for seizures. Level of alertness: Awake, asleep AEDs during EEG study: LEV Technical aspects: This EEG study was done with scalp electrodes positioned according to the 10-20 International system of electrode placement. Electrical activity was acquired at a sampling rate of 500Hz  and reviewed with a high frequency filter of 70Hz  and a low frequency filter of 1Hz . EEG data were recorded continuously and digitally stored. Description: During awake state, no clear posterior dominant rhythm was seen.  Sleep was characterized by sleep spindles (12 to 14 Hz), maximal frontocentral region.  EEG showed continuous generalized polymorphic 5 to 6 Hz theta slowing and intermittent 2 to 3 Hz delta slowing. Hyperventilation and photic stimulation were not performed.   ABNORMALITY -Continuous slow, generalized IMPRESSION: This study is  suggestive of moderate diffuse encephalopathy, nonspecific etiology but likely related to sedation, post-ictal state. No seizures or definite epileptiform discharges were seen throughout the recording. Priyanka Barbra Sarks   Assessment and Plan:  This is a 54 year old female with:  1.  Metastatic carcinoma with neuroendocrine features.  Outside [facility] from [facility] have been reviewed and are summarized above.  The patient has metastatic carcinoma with neuroendocrine features and received 1 cycle of carboplatin and etoposide on 04/14/2020 while hospitalized.  She was advised to follow-up with medical and radiation oncology in [place], [place] but it is not clear if she followed up as advised.  The patient is very sedated at this time and difficult to fully assess her functional status.  Further treatment recommendations once we can fully evaluate her.  2.  Seizures.  Patient had a seizure in the setting of crack cocaine use.  Neurology has evaluated the patient and has recommended MRI the  brain with and without contrast, EEG,  and have started her on Keppra.  Further management per neurology.  3.  Polysubstance abuse.  The patient has ongoing crack cocaine use.  Urine drug screen positive for cocaine.  Recommend cocaine cessation.  Thank you for this referral.   Mikey Bussing, DNP, AGPCNP-BC, AOCNP   Oncology addendum  Patient seen and examined and medical record reviewed.  I agree with the note above.  54 year old woman with appears to be extensive stage small cell cancer after presenting with lymphadenopathy and presumably brain metastasis.  He has received 1 cycle of chemotherapy and currently hospitalized after presenting with seizure activity.  Physical exam was noncontributory and the patient was not able to cooperate with examination.  MRI of the brain did not show any acute process or intracranial metastasis.    Recommendations:   Given her extensive metastatic disease in the setting of poor compliance as well as polysubstance abuse, subsequent chemotherapy would offer very little palliation.  This disease is incurable and any treatment would be palliative at best.  I recommend proceed with supportive care as you are doing.  Involvement with palliative medicine team would be reasonable to define her goals of care possibly transitioning to hospice.  No inpatient oncological treatment is warranted at this time.  Zola Button MD 07/13/2020

## 2020-07-13 NOTE — ED Notes (Signed)
Patient transported to MRI 

## 2020-07-13 NOTE — Consult Note (Addendum)
Neurology Consultation  Reason for Consult: Seizure in the setting of cocaine use, hypertension and possible brain metastases Referring Physician: Dr. Karmen Bongo  CC: Seizure  History is obtained from: 57 from chart some from patient as she is extremely drowsy.  HPI: Holly Weber is a 54 y.o. 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  Patient presented to the hospital after friends noticed seizure activity.  She does report history of seizures however this was a long time ago.  When asked if she did cocaine she states a "she did have cocaine the night before.".  She is complaining of abdominal pain.  She does further report that she was recently released from prison for medical reasons.  From chart it states 07/11/2020, per note (this was confirmed in the criminal record database; she was imprisoned on theft and drug charges and was supposed to remain till 2023 [place] released on 10/20-secondary to medical issues).  She was at [facility], [place], in July.  Looking back in chart she was also hospitalized for cocaine overdose in January 2020.  Currently she is extremely drowsy-I do not note that she is had any narcotics or sedating medications at this point.  She needs stimulation to continue getting more information from her.  She is complaining of severe stomach pain at this point in time.  On arrival at 0537 blood pressure was 187/89 with a heart rate of 120.  Most recent blood pressure is 151/93 with pulse of 117.  Admitting hospitalist did receive some paper charts from [facility] and I have copied the impression from her note : "Records were requested and  received from [facility] in [place] and confirm history.  She was admitted from 7/17-28 with c/o SOB, CP, and cough x 6-8 weeks.  Also with LAD x years but worse recently.  CTA with extensive mediastinal and hilar LAD and lower neck LAD with 2 R lung masses (1 in pleura and the other in parenchyma).  +60 pounds weight loss - but also with h/o untreated thyroid disease (hyperthyroid during that admission).  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  MRI with findings suspicious for endocranial mets with at least 2 R hemispheric lesions up to 5 mm in the parietal lobe without mass effect/edema.  She was treated with Carboplatin and Etoposide x 1 and treated with Filgrastim for 10 days; it is not clear if this treatment was continued after her d/c back to prison.  She was recommended to f/u with oncology and Rad Onc in [place].  For hyperthyroidism, she was started on Methimazole 20 mg QID and potassium iodide 250 mg QID as well as Propranolol 60 mg TID and Ativan 0.5 TID prn for the associated anxiety."   Past Medical History:  Diagnosis Date  . Colitis, ulcerative ([place])   . Depression   . Hepatitis C   . Hypertension   . Stroke ([place])   . Thyroid disease     Family History  Problem Relation Age of Onset  . Hypertension Mother   . Hypertension Father    Social History:   reports that she has been smoking cigarettes. She has been smoking about 1.50 packs per day. She has never used smokeless tobacco.  She reports current alcohol use. She reports current drug use. Drugs: Cocaine and Marijuana.  Medications  Current Facility-Administered Medications:  .  0.9 %  sodium chloride infusion, 75 mL/hr, Intravenous, Continuous, Karmen Bongo, MD .  acetaminophen (TYLENOL) tablet 650 mg, 650 mg, Oral, Q4H PRN **OR** acetaminophen (TYLENOL) suppository 650 mg, 650 mg, Rectal, Q4H PRN, Karmen Bongo, MD .  docusate sodium (COLACE)  capsule 100 mg, 100 mg, Oral, BID, Karmen Bongo, MD .  enoxaparin (LOVENOX) injection 40 mg, 40 mg, Subcutaneous, Q24H, Karmen Bongo, MD .  LORazepam (ATIVAN) injection 1-2 mg, 1-2 mg, Intravenous, Q2H PRN, Karmen Bongo, MD .  ondansetron [facility]) tablet 4 mg, 4 mg, Oral, Q6H PRN **OR** ondansetron (ZOFRAN) injection 4 mg, 4 mg, Intravenous, Q6H PRN, Karmen Bongo, MD .  polyethylene glycol (MIRALAX / GLYCOLAX) packet 17 g, 17 g, Oral, Daily PRN, Karmen Bongo, MD No current outpatient medications on file.  ROS:   General ROS: negative for - chills, fatigue, fever, night sweats, weight gain or weight loss Psychological ROS: negative for - behavioral disorder, hallucinations, memory difficulties, mood swings or suicidal ideation Ophthalmic ROS: negative for - blurry vision, double vision, eye pain or loss of vision ENT ROS: negative for - epistaxis, nasal discharge, oral lesions, sore throat, tinnitus or vertigo Respiratory ROS: negative for - cough, hemoptysis, shortness of breath or wheezing Cardiovascular ROS: negative for - chest pain, dyspnea on exertion, edema or irregular heartbeat Gastrointestinal ROS: Positive for - abdominal pain Genito-Urinary ROS: negative for - dysuria, hematuria, incontinence or urinary frequency/urgency Musculoskeletal ROS: Positive for -muscular pain Neurological ROS: as noted in HPI Dermatological ROS: negative for rash and skin lesion changes  Exam: Current vital signs: BP (!) 151/93 (BP Location: Right Arm)   Pulse (!) 117   Temp 98.2 F (36.8 C) (Oral)   Resp (!) 32   Ht 5' 5"  (1.651 m)   Wt 77.1 kg   SpO2 95%   BMI 28.29 kg/m  Vital signs in last 24 hours: Temp:  [97.8 F (36.6 C)-98.2 F (36.8 C)] 98.2 F (36.8 C) (10/22 0933) Pulse Rate:  [116-120] 117 (10/22 0933) Resp:  [25-34] 32 (10/22 0933) BP: (151-187)/(82-93) 151/93 (10/22 0933) SpO2:  [94 %-96 %] 95 % (10/22 0933) Weight:  [77.1 kg] 77.1 kg (10/22  0537)   Constitutional: Appears well-developed and well-nourished.  Eyes: Positive scleral injection HENT: No OP obstrucion Head: Normocephalic.  Cardiovascular: Sinus rhythm but tachycardic.  Respiratory: Effort normal, non-labored breathing GI: Soft.    Positive abdominal tenderness.  Skin: WDI  Neuro: Mental Status: Patient is awake, alert, oriented to person, place, month Speech-slow processing however no dysarthria or aphasia.  Naming, repeating intact.  Comprehension is slow but able to answer questions.  Able to follow commands that are simple. Cranial Nerves: II: Visual Fields are full.  III,IV, VI: EOMI without ptosis or diploplia. Pupils equal, round and reactive to light V: Facial sensation is symmetric to temperature VII: Facial movement is symmetric.  VIII: hearing is intact to voice X: Palat elevates symmetrically XI: Shoulder shrug is symmetric. XII: tongue is midline without atrophy or fasciculations.  Motor: 4/5 throughout likely due to discomfort and pain Drift not present aterixis not present Sensory: Sensation is symmetric to light touch and temperature in the arms and legs. Deep Tendon Reflexes: 2+ and symmetric in the biceps and patellae.  Plantars: Toes are downgoing bilaterally.  Cerebellar: FNF intact bilaterally  Labs I have reviewed labs in epic and the results pertinent to  this consultation are:   CBC    Component Value Date/Time   WBC 13.4 (H) 07/13/2020 0534   RBC 5.94 (H) 07/13/2020 0534   HGB 16.7 (H) 07/13/2020 0605   HGB 14.4 01/01/2017 1049   HCT 49.0 (H) 07/13/2020 0605   HCT 42.3 01/01/2017 1049   PLT 257 07/13/2020 0534   PLT 391 (H) 01/01/2017 1049   MCV 81.6 07/13/2020 0534   MCV 77 (L) 01/01/2017 1049   MCH 26.1 07/13/2020 0534   MCHC 32.0 07/13/2020 0534   RDW 14.8 07/13/2020 0534   RDW 14.1 01/01/2017 1049   LYMPHSABS 3.2 07/13/2020 0534   LYMPHSABS 1.2 01/01/2017 1049   MONOABS 1.2 (H) 07/13/2020 0534   EOSABS  0.8 (H) 07/13/2020 0534   EOSABS 0.0 01/01/2017 1049   BASOSABS 0.1 07/13/2020 0534   BASOSABS 0.1 01/01/2017 1049    CMP     Component Value Date/Time   NA 136 07/13/2020 0630   K 4.1 07/13/2020 0630   CL 106 07/13/2020 0630   CO2 23 07/13/2020 0630   GLUCOSE 195 (H) 07/13/2020 0630   BUN 10 07/13/2020 0630   CREATININE 0.54 07/13/2020 0630   CALCIUM 8.9 07/13/2020 0630   PROT 7.4 07/13/2020 0630   ALBUMIN 3.3 (L) 07/13/2020 0630   AST 31 07/13/2020 0630   ALT 27 07/13/2020 0630   ALKPHOS 127 (H) 07/13/2020 0630   BILITOT 1.9 (H) 07/13/2020 0630   GFRNONAA >60 07/13/2020 0630   GFRAA >60 10/21/2018 2150    Imaging I have reviewed the images obtained:  CT-scan of the brain-redemonstrated small chronic infarct within the superior aspect of the paramedian right occipital lobe.  No evidence of acute intracranial abnormality.  No evidence of PRES  MR brain with scattered T2 hyperintensities but no clear lesion with mass-effect to suggest metastases although the MR study was done without contrast due to patient cooperation.   Etta Quill PA-C [facility] [phone]  M-F  (9:00 am- 5:00 PM)  07/13/2020, 9:38 AM   Attending addendum I seen and examined the patient 54 year old presented with seizure after cocaine use. Has been diagnosed with metastatic small cell lung cancer at an outside hospital. Has been told she has few weeks to live. Very drowsy on exam but nonfocal. Oncology has evaluated the patient and do not feel that any inpatient treatment would be beneficial as this is a completely incurable cancer.    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   Currently patient is very drowsy and needs continuous stimuli in order to get history.  No localizing or lateralizing abnormalities noted.  No further seizures noted while  in the ED. Her EEG revealed slowing suggestive of moderate diffuse encephalopathy nonspecific in etiology.  No seizures or definitive epileptiform discharges seen. She was also very hypertensive on arrival, along with recent cocaine use. Likely provoked seizure from cocaine use hypertension as well as possibly due to the metastases, although the brain MRI is not very impressive for big metastases  Recommendations: -Try to obtain MRI brain with contrast for better look for small metastases. --If she continues to be somnolent and does not return to her baseline, which I am not even sure what her baseline is, will consider LTM --Keppra 500 mg BID --Maintain seizure precautions --Ativan for any seizure lasting more than 5 minutes.   -- Amie [place], MD [facility]  Pager: [phone] If 7pm to 7am, please call on call as listed on AMION.

## 2020-07-13 NOTE — ED Notes (Signed)
Oncology NP bedside. NP aware pt is groggy and lethargic. Ativan 2 mg given at 1114 for MRI scan.

## 2020-07-13 NOTE — Procedures (Signed)
Patient Name: JASMAN PFEIFLE  MRN: [id number]  Epilepsy Attending: Lora Havens  Referring Physician/Provider: Dr. Karmen Bongo Date: 07/12/2020 Duration: 23.07 minutes  Patient history: 54 year old female presented to the hospital with seizures in the setting of cocaine use.  EEG to evaluate for seizures.  Level of alertness: Awake, asleep   AEDs during EEG study: LEV  Technical aspects: This EEG study was done with scalp electrodes positioned according to the 10-20 International system of electrode placement. Electrical activity was acquired at a sampling rate of 500Hz  and reviewed with a high frequency filter of 70Hz  and a low frequency filter of 1Hz . EEG data were recorded continuously and digitally stored.   Description: During awake state, no clear posterior dominant rhythm was seen.  Sleep was characterized by sleep spindles (12 to 14 Hz), maximal frontocentral region.  EEG showed continuous generalized polymorphic 5 to 6 Hz theta slowing and intermittent 2 to 3 Hz delta slowing. Hyperventilation and photic stimulation were not performed.     ABNORMALITY -Continuous slow, generalized  IMPRESSION: This study is  suggestive of moderate diffuse encephalopathy, nonspecific etiology but likely related to sedation, post-ictal state. No seizures or definite epileptiform discharges were seen throughout the recording.  Anjenette Gerbino Barbra Sarks

## 2020-07-13 NOTE — ED Provider Notes (Addendum)
[facility] DEPT Provider Note   CSN: [id number] Arrival date & time: 07/13/20  5277     History Chief Complaint  Patient presents with  . Seizures    Patient here for a witnessed seizure.    Holly Weber is a 54 y.o. female.  The history is provided by the EMS personnel. The history is limited by the condition of the patient.  Seizures Seizure activity on arrival: no   Seizure type:  Grand mal Initial focality:  None Episode characteristics: generalized shaking   Postictal symptoms: confusion and memory loss   Severity:  Moderate Timing:  Once Number of seizures this episode:  1 Progression:  Resolved Context: medical non-compliance   Context: not alcohol withdrawal and not hydrocephalus   Context comment:  Non compliant with BP medication and DM medication  Recent head injury:  No recent head injuries PTA treatment:  None Patient with HTN, DM, HCV, throat cancer and non compliance with medication regimen presents with witnessed seizure activity at home.  Patient reports she has been incarcerated and was just d/c 3 days ago and does not have any meds to take.  No CP, no n/v/d. No f/c/r.       Past Medical History:  Diagnosis Date  . Colitis, ulcerative ([place])   . Depression   . Hepatitis C   . Hypertension   . Stroke ([place])   . Thyroid disease     Patient Active Problem List   Diagnosis Date Noted  . Crack cocaine overdose ([place])   . Cocaine overdose ([place]) 10/18/2018  . Cellulitis 10/18/2018  . Hypokalemia 10/18/2018  . MSSA (methicillin susceptible Staphylococcus aureus) infection 12/27/2016  . Bacteremia 12/22/2016  . Polysubstance abuse ([place]) 12/22/2016  . Osteomyelitis ([place]) 12/22/2016  . Hyponatremia 12/22/2016  . Osteomyelitis of left hip ([place])   . Sacroiliitis ([place])   . Staphylococcus aureus bacteremia 12/15/2016  . Pneumonia 12/15/2016  . Hyperglycemia 12/15/2016  . Normocytic anemia 12/15/2016  . IV drug user  12/15/2016  . Acute left-sided low back pain with left-sided sciatica 12/13/2016  . Cocaine abuse ([place]) 12/13/2016  . Tobacco abuse 12/13/2016  . HTN (hypertension) 12/13/2016    Past Surgical History:  Procedure Laterality Date  . ARM WOUND REPAIR / CLOSURE    . COLONOSCOPY    . OPEN REDUCTION INTERNAL FIXATION (ORIF) DISTAL RADIAL FRACTURE Left 01/07/2018   Procedure: OPEN REDUCTION INTERNAL FIXATION (ORIF) DISTAL RADIAL FRACTURE;  Surgeon: Leanora Cover, MD;  Location: [facility];  Service: Orthopedics;  Laterality: Left;  . TEE WITHOUT CARDIOVERSION N/A 12/17/2016   Procedure: TRANSESOPHAGEAL ECHOCARDIOGRAM (TEE);  Surgeon: Josue Hector, MD;  Location: [facility] ENDOSCOPY;  Service: Cardiovascular;  Laterality: N/A;  . TONSILLECTOMY       OB History   No obstetric history on file.     History reviewed. No pertinent family history.  Social History   Tobacco Use  . Smoking status: Current Every Day Smoker    Packs/day: 1.50    Types: Cigarettes  . Smokeless tobacco: Never Used  Substance Use Topics  . Alcohol use: Yes    Comment: Daily   . Drug use: Yes    Types: Cocaine, Marijuana    Comment: Daily on both    Home Medications Prior to Admission medications   Medication Sig Start Date End Date Taking? Authorizing Provider  nicotine (NICODERM CQ - DOSED IN MG/24 HOURS) 21 mg/24hr patch Place 1 patch (21 mg total) onto the skin  daily. 10/21/18   Mendel Corning, MD    Allergies    Asa [aspirin], Nsaids, and Penicillins  Review of Systems   Review of Systems  Constitutional: Negative for fever.  HENT: Negative for congestion.   Eyes: Negative for visual disturbance.  Respiratory: Negative for shortness of breath.   Cardiovascular: Negative for chest pain.  Gastrointestinal: Negative for abdominal pain and vomiting.  Genitourinary: Negative for difficulty urinating.  Musculoskeletal: Negative for arthralgias.  Skin: Negative for wound.  Neurological:  Positive for seizures.  Psychiatric/Behavioral: Positive for confusion.  All other systems reviewed and are negative.   Physical Exam Updated Vital Signs BP (!) 187/89 (BP Location: Right Arm)   Pulse (!) 120   Temp 97.8 F (36.6 C) (Oral)   Resp (!) 25   Ht 5' 5"  (1.651 m)   Wt 77.1 kg   SpO2 96%   BMI 28.29 kg/m   Physical Exam Vitals and nursing note reviewed.  Constitutional:      Appearance: Normal appearance.  HENT:     Head: Normocephalic and atraumatic.     Nose: Nose normal.  Eyes:     Conjunctiva/sclera: Conjunctivae normal.     Pupils: Pupils are equal, round, and reactive to light.  Cardiovascular:     Rate and Rhythm: Regular rhythm. Tachycardia present.     Pulses: Normal pulses.     Heart sounds: Normal heart sounds.  Pulmonary:     Effort: Pulmonary effort is normal.     Breath sounds: Normal breath sounds.  Abdominal:     General: Abdomen is flat. Bowel sounds are normal.     Palpations: Abdomen is soft.     Tenderness: There is no abdominal tenderness. There is no guarding.  Musculoskeletal:        General: Normal range of motion.     Cervical back: Normal range of motion and neck supple.  Skin:    General: Skin is warm and dry.     Capillary Refill: Capillary refill takes less than 2 seconds.  Neurological:     General: No focal deficit present.     Mental Status: She is alert.     Deep Tendon Reflexes: Reflexes normal.  Psychiatric:        Mood and Affect: Mood normal.     ED Results / Procedures / Treatments   Labs (all labs ordered are listed, but only abnormal results are displayed) Results for orders placed or performed during the hospital encounter of 07/13/20  CBC with Differential/Platelet  Result Value Ref Range   WBC 13.4 (H) 4.0 - 10.5 K/uL   RBC 5.94 (H) 3.87 - 5.11 MIL/uL   Hemoglobin 15.5 (H) 12.0 - 15.0 g/dL   HCT 48.5 (H) 36 - 46 %   MCV 81.6 80.0 - 100.0 fL   MCH 26.1 26.0 - 34.0 pg   MCHC 32.0 30.0 - 36.0 g/dL    RDW 14.8 11.5 - 15.5 %   Platelets 257 150 - 400 K/uL   nRBC 0.0 0.0 - 0.2 %   Neutrophils Relative % 59 %   Neutro Abs 7.9 (H) 1.7 - 7.7 K/uL   Lymphocytes Relative 24 %   Lymphs Abs 3.2 0.7 - 4.0 K/uL   Monocytes Relative 9 %   Monocytes Absolute 1.2 (H) 0.1 - 1.0 K/uL   Eosinophils Relative 6 %   Eosinophils Absolute 0.8 (H) 0.0 - 0.5 K/uL   Basophils Relative 1 %   Basophils Absolute 0.1 0.0 -  0.1 K/uL   Immature Granulocytes 1 %   Abs Immature Granulocytes 0.09 (H) 0.00 - 0.07 K/uL  I-stat chem 8, ED (not at [facility] or [facility])  Result Value Ref Range   Sodium 137 135 - 145 mmol/L   Potassium 4.7 3.5 - 5.1 mmol/L   Chloride 106 98 - 111 mmol/L   BUN 8 6 - 20 mg/dL   Creatinine, Ser 0.40 (L) 0.44 - 1.00 mg/dL   Glucose, Bld 229 (H) 70 - 99 mg/dL   Calcium, Ion 1.09 (L) 1.15 - 1.40 mmol/L   TCO2 23 22 - 32 mmol/L   Hemoglobin 16.7 (H) 12.0 - 15.0 g/dL   HCT 49.0 (H) 36 - 46 %   No results found.  EKG None  Radiology No results found.  Procedures Procedures (including critical care time)  Medications Ordered in ED Medications  levETIRAcetam (KEPPRA) IVPB 1500 mg/ 100 mL premix (0 mg Intravenous Stopped 07/13/20 0623)  sodium chloride 0.9 % bolus 500 mL (500 mLs Intravenous New Bag/Given 07/13/20 0604)  hydrALAZINE (APRESOLINE) injection 5 mg (5 mg Intravenous Given 07/13/20 0998)    ED Course  I have reviewed the triage vital signs and the nursing notes.  Pertinent labs & imaging results that were available during my care of the patient were reviewed by me and considered in my medical decision making (see chart for details).    I suspect BP is to blame for seizure. She seems more confused than one would expect and I believe this is a HTN encephalopathy.  Will lower BP, start keppra and admit to medicine with neuro consult.    Case d/w Dr. Leonel Ramsay, if PRES no further keppra no need for neuro intervention.  If other findings continue keppra and consult  Final  Clinical Impression(s) / ED Diagnoses Final diagnoses:  Seizure-like activity ([place])  Hypertensive encephalopathy    Admit to Bonnita Levan, Lorraina Spring, MD 07/13/20 [place], Datrell Dunton, MD 07/13/20 3382

## 2020-07-13 NOTE — H&P (Signed)
History and Physical    Holly Weber WLN:[id number] DOB: July 27, 1966 DOA: 07/13/2020  PCP: Patient, No Pcp Per Consultants:  Lorenso Courier- oncology Patient coming from: Home - lives with her father; NOK: Cato Mulligan Irelynn Schermerhorn, [phone]  Chief Complaint: seizure  HPI: Holly Weber is a 54 y.o. female with medical history significant of hypothyroidism; CVA; HTN; polysubstance abuse; Hep C; and UC presenting with seizure activity. She reports that she apparently had a seizure witnessed by friends.  She reports prior h/o seizures but has never taken medications for this issue.  She reports that she currently feels "not too good."  She denies pain but rather reports that she feels like "What the f*ck?!"    She further reports that she was released from prison early on medical leave on 07/11/20 (confirmed in the Criminal Records database; she was imprisoned on theft and drug charges and was supposed to remain until 2023 but was released on 10/20 - although it does not say why she received early release other than that she was paroled).  She says that she was hospitalized a week ago at [facility] (no records available to corroborate this in [place] at this time) and that she was diagnosed with "5 cancers" (brain, throat, lungs, ?, ?) and told that she only has 2 weeks to live.  She was discharged early to make the most of her time left.  She acknowledges using crack cocaine last night with friends.  She was last hospitalized for cocaine overdose in January 2020.  She was discharged to jail and while being processed had seizure-like activity and so was brought back to the ER.  At that time, she reported h/o intermittent seizures dating back to 2006.    Records were requested and received from [facility] in [place] and confirm history.  She was admitted from 7/17-28 with c/o SOB, CP, and cough x 6-8 weeks.  Also with LAD x years but worse recently.  CTA with extensive mediastinal and  hilar LAD and lower neck LAD with 2 R lung masses (1 in pleura and the other in parenchyma).  +60 pounds weight loss - but also with h/o untreated thyroid disease (hyperthyroid during that admission).  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  MRI with findings suspicious for endocranial mets with at least 2 R hemispheric lesions up to 5 mm in the parietal lobe without mass effect/edema.  She was treated with Carboplatin and Etoposide x 1 and treated with Filgrastim for 10 days; it is not clear if this treatment was continued after her d/c back to prison.  She was recommended to f/u with oncology and Rad Onc in [place].  For hyperthyroidism, she was started on Methimazole 20 mg QID and potassium iodide 250 mg QID as well as Propranolol 60 mg TID and Ativan 0.5 TID prn for the associated anxiety.    ED Course:  Carryover, per Dr. Hal Hope:  54 year old female with history of polysubstance abuse just released from the jail was noticed to have generalized tonic-clonic seizure by the patient's friends and was brought to the ER. In the ER patient is slightly confused postictal was loaded on Keppra CT head is still pending since has been busy in the ER. Neurology called - one dose of Keppra, if CT with PRES stop Keppra and no need for neuro.  If no PRES, continue Keppra and consult neuro.   Review of Systems: As per HPI; otherwise review of systems reviewed  and negative.   Ambulatory Status:  Ambulates without assistance  COVID Vaccine Status:   Complete  Past Medical History:  Diagnosis Date  . Colitis, ulcerative ([place])   . Depression   . Hepatitis C   . Hypertension   . Stroke ([place])   . Thyroid disease     Past Surgical History:  Procedure Laterality Date  . ARM WOUND REPAIR / CLOSURE    . COLONOSCOPY    . OPEN REDUCTION INTERNAL FIXATION (ORIF) DISTAL RADIAL FRACTURE Left 01/07/2018   Procedure: OPEN REDUCTION INTERNAL  FIXATION (ORIF) DISTAL RADIAL FRACTURE;  Surgeon: Leanora Cover, MD;  Location: [facility];  Service: Orthopedics;  Laterality: Left;  . TEE WITHOUT CARDIOVERSION N/A 12/17/2016   Procedure: TRANSESOPHAGEAL ECHOCARDIOGRAM (TEE);  Surgeon: Josue Hector, MD;  Location: [facility] ENDOSCOPY;  Service: Cardiovascular;  Laterality: N/A;  . TONSILLECTOMY      Social History   Socioeconomic History  . Marital status: Widowed    Spouse name: Not on file  . Number of children: Not on file  . Years of education: Not on file  . Highest education level: Not on file  Occupational History  . Not on file  Tobacco Use  . Smoking status: Current Every Day Smoker    Packs/day: 1.50    Types: Cigarettes  . Smokeless tobacco: Never Used  Substance and Sexual Activity  . Alcohol use: Yes    Comment: Daily   . Drug use: Yes    Types: Cocaine, Marijuana    Comment: Daily on both  . Sexual activity: Yes    Birth control/protection: None  Other Topics Concern  . Not on file  Social History Narrative  . Not on file   Social Determinants of Health   Financial Resource Strain:   . Difficulty of Paying Living Expenses: Not on file  Food Insecurity:   . Worried About Charity fundraiser in the Last Year: Not on file  . Ran Out of Food in the Last Year: Not on file  Transportation Needs:   . Lack of Transportation (Medical): Not on file  . Lack of Transportation (Non-Medical): Not on file  Physical Activity:   . Days of Exercise per Week: Not on file  . Minutes of Exercise per Session: Not on file  Stress:   . Feeling of Stress : Not on file  Social Connections:   . Frequency of Communication with Friends and Family: Not on file  . Frequency of Social Gatherings with Friends and Family: Not on file  . Attends Religious Services: Not on file  . Active Member of Clubs or Organizations: Not on file  . Attends Archivist Meetings: Not on file  . Marital Status: Not on file    Intimate Partner Violence:   . Fear of Current or Ex-Partner: Not on file  . Emotionally Abused: Not on file  . Physically Abused: Not on file  . Sexually Abused: Not on file    Allergies  Allergen Reactions  . Asa [Aspirin] Other (See Comments)    Ulcerative colitis flares.  . Nsaids Other (See Comments)    Ulcerative colitis flares  . Penicillins Rash and Other (See Comments)    Has patient had a PCN reaction causing immediate rash, facial/tongue/throat swelling, SOB or lightheadedness with hypotension:unsure Has patient had a PCN reaction causing severe rash involving mucus membranes or skin necrosis:unsure Has patient had a PCN reaction that required hospitalization:No Has patient had a PCN reaction  occurring within the last 10 years:Yes If all of the above answers are "NO", then may proceed with Cephalosporin use.     Family History  Problem Relation Age of Onset  . Hypertension Mother   . Hypertension Father     Prior to Admission medications   Medication Sig Start Date End Date Taking? Authorizing Provider  nicotine (NICODERM CQ - DOSED IN MG/24 HOURS) 21 mg/24hr patch Place 1 patch (21 mg total) onto the skin daily. 10/21/18   Mendel Corning, MD    Physical Exam: Vitals:   07/13/20 1245 07/13/20 1315 07/13/20 1326 07/13/20 1356  BP: 137/60  138/74 129/89  Pulse: (!) 119 (!) 116 (!) 122 (!) 118  Resp: (!) 32 (!) 34 (!) 34 (!) 38  Temp:   98.2 F (36.8 C) 98.2 F (36.8 C)  TempSrc:   Oral Oral  SpO2: 91% 94% 93% 92%  Weight:      Height:         . General:  Appears calm and comfortable and is NAD, mildly confused  . Eyes:  PERRL, EOMI, normal lids, iris . ENT:  grossly normal hearing, lips & tongue, mmm . Neck:  no LAD, masses or thyromegaly . Cardiovascular:  RR with mild tachycardia, no m/r/g. No LE edema.  Marland Kitchen Respiratory:   CTA bilaterally with no wheezes/rales/rhonchi.  Normal respiratory effort. . Abdomen:  soft, NT, ND, NABS . Skin:  no rash or  induration seen on limited exam . Musculoskeletal:  grossly normal tone BUE/BLE, good ROM, no bony abnormality . Psychiatric:  grossly normal mood and affect, speech fluent and appropriate, AOx3 . Neurologic:  CN 2-12 grossly intact, moves all extremities in coordinated fashion    Radiological Exams on Admission: CT Head Wo Contrast  Result Date: 07/13/2020 CLINICAL DATA:  Seizure, abnormal neuro exam. Additional history provided: Witness seizure EXAM: CT HEAD WITHOUT CONTRAST TECHNIQUE: Contiguous axial images were obtained from the base of the skull through the vertex without intravenous contrast. COMPARISON:  Prior head CT 10/21/2018.  Prior brain MRI 10/21/2018. FINDINGS: Brain: Cerebral volume is normal for age. Redemonstrated small chronic infarct within the superior aspect of the paramedian right occipital lobe (for instance as seen on series 2, image 19) (series 5, image 20). Mild ill-defined hypoattenuation within the cerebral white matter is nonspecific, but compatible with chronic small vessel ischemic disease. There is no acute intracranial hemorrhage. No acute demarcated cortical infarct. No extra-axial fluid collection. No evidence of intracranial mass. No midline shift. Vascular: No hyperdense vessel.  Atherosclerotic calcifications. Skull: Normal. Negative for fracture or focal lesion. Sinuses/Orbits: Visualized orbits show no acute finding. No significant paranasal sinus disease or mastoid effusion at the imaged levels. IMPRESSION: No evidence of acute intracranial abnormality. Redemonstrated small chronic cortical infarct within the paramedian right occipital lobe. Mild cerebral white matter chronic small vessel ischemic disease, stable as compared to the brain MRI of 10/21/2018. Electronically Signed   By: Kellie Simmering DO   On: 07/13/2020 07:53   MR BRAIN WO CONTRAST  Result Date: 07/13/2020 CLINICAL DATA:  Seizure after cocaine use. EXAM: MRI HEAD WITHOUT CONTRAST TECHNIQUE:  Multiplanar, multiecho pulse sequences of the brain and surrounding structures were obtained without intravenous contrast. COMPARISON:  Head CT same day.  MRI 10/21/2018. FINDINGS: Brain: The study suffers from motion degradation. Diffusion imaging does not show any acute or subacute infarction or other cause of restricted diffusion. No focal abnormality affects the brainstem or cerebellum. There is an old cortical and  subcortical infarction scratched at there are old cortical and subcortical infarctions in the right occipital lobe. Chronic small-vessel ischemic changes affect the hemispheric white matter particularly in the frontal lobes. No mass lesion, hemorrhage, hydrocephalus or extra-axial collection. Vascular: Major vessels at the base of the brain show flow. Skull and upper cervical spine: Negative Sinuses/Orbits: Clear/normal Other: None IMPRESSION: Motion degraded exam. No acute or reversible finding. Old right occipital cortical and subcortical infarctions. Chronic small-vessel ischemic changes of the hemispheric white matter. Electronically Signed   By: Nelson Chimes M.D.   On: 07/13/2020 12:31   DG Chest Portable 1 View  Result Date: 07/13/2020 CLINICAL DATA:  Seizures EXAM: PORTABLE CHEST 1 VIEW COMPARISON:  12/13/2016 FINDINGS: Shallow inspiration. Heart size is normal. Bilateral basilar infiltrates could represent pneumonia or edema. No pleural effusions. No pneumothorax. Right power port type central venous catheter with tip over the low SVC region. IMPRESSION: Bilateral basilar infiltrates. Electronically Signed   By: Lucienne Capers M.D.   On: 07/13/2020 06:39   EEG adult  Result Date: 07/13/2020 Lora Havens, MD     07/13/2020 11:20 AM Patient Name: Holly Weber MRN: [id number] Epilepsy Attending: Lora Havens Referring Physician/Provider: Dr. Karmen Bongo Date: 07/12/2020 Duration: 23.07 minutes Patient history: 54 year old female presented to the hospital with seizures in  the setting of cocaine use.  EEG to evaluate for seizures. Level of alertness: Awake, asleep AEDs during EEG study: LEV Technical aspects: This EEG study was done with scalp electrodes positioned according to the 10-20 International system of electrode placement. Electrical activity was acquired at a sampling rate of 500Hz  and reviewed with a high frequency filter of 70Hz  and a low frequency filter of 1Hz . EEG data were recorded continuously and digitally stored. Description: During awake state, no clear posterior dominant rhythm was seen.  Sleep was characterized by sleep spindles (12 to 14 Hz), maximal frontocentral region.  EEG showed continuous generalized polymorphic 5 to 6 Hz theta slowing and intermittent 2 to 3 Hz delta slowing. Hyperventilation and photic stimulation were not performed.   ABNORMALITY -Continuous slow, generalized IMPRESSION: This study is  suggestive of moderate diffuse encephalopathy, nonspecific etiology but likely related to sedation, post-ictal state. No seizures or definite epileptiform discharges were seen throughout the recording. Priyanka Barbra Sarks    EKG: Independently reviewed.  Sinus tachycardia with rate 114; no evidence of acute ischemia   Labs on Admission: I have personally reviewed the available labs and imaging studies at the time of the admission.  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 [facility])   Seizure -Patient with prior reported h/o seizure activity but not on AEDs presenting with apparent seizure activity at home -This is complicated by her release from prison on 10/20  with use of cocaine and subsequent seizure-type activity on 10/21 -It is further complicated by neuroendocrine metastatic cancer with spread to brain on 03/2020 MRI -Patient has not established care since she was just d/c'd from prison 2 days ago -Will admit to [facility] in case of need for LTM or other weekend neurologic intervention -Neurology has seen the patient -Loaded with Keppra and  will continue -Will order EEG and MRI -She also will need driving restriction for at least 6 months -Seizure precautions -Ativan prn  Neuroendocrine CA with metastatic disease -Patient was diagnosed at [facility] in July -She received one course of Carboplatin/Etoposide followed by 10 days of Filgrastim -She was transferred back to prison near [place] and it is not clear if she received further oncology/rad onc therapy at that time -She reports that she was told she has 2 weeks left to live and so was discharged from prison early; I am able to verify the early parole but the reason is not revealed in prison records available online -Oncology consult recommended -With h/o mets to brain, MRI and neurology consult ordered, as above  Polysubstance abuse -Patient with long-standing drug abuse/dependence - clearly with cocaine overdose in the past and also with reported IVDA -Many of her convictions in criminal court have been for drug-related issues -She was released from prison on 10/20 and used again on 10/21 by admission -She does not appear to have been able to use long enough to have developed recurrent tolerance, unless she was obtaining cocaine in prison -For now, will not plan to monitor with COWS protocol or provide Clonidine withdrawal protocol -UDS confirms cocaine only -Cessation encouraged  HTN -She does not appear to be taking medications for this issue at this time  Hyperthyroidism -No longer taking thyroid medications -Chart lists prior h/o hypothyroidism but she was reportedly  hyperthyroid in July -Will check TSH/free T4 -No treatment at this time  Hyperglycemia -No known h/o DM but [facility] and is 229 today -Will check A1c -Will cover with moderate-scale SSI -However, if her long-term prognosis is as limited as she reports, there would not be a need for tight ongoing glycemic control as an outpatient  Tobacco abuse -Encourage cessation.   -This was discussed with the patient and should be reviewed on an ongoing basis.   -Patch ordered      Note: This patient has been tested and is negative for the novel coronavirus COVID-19. The patient has been fully vaccinated against COVID-19.    DVT prophylaxis:   Lovenox  Code Status:  Full - confirmed with patient Family Communication: None present; I was unable to reach her father by telephone at the time of admission Disposition Plan:  The patient is from: home  Anticipated d/c is to: be determined  Anticipated d/c date will depend on clinical response to treatment, but is likely to be several days  Patient is currently: acutely ill Consults called: Neurology; Oncology; [facility] team Admission status:  Admit - It is my clinical opinion that admission to INPATIENT is reasonable and necessary because of the expectation that this patient will require hospital care that crosses at least 2 midnights to treat this condition based on the medical complexity of the problems presented.  Given the aforementioned information, the predictability of an adverse outcome is felt to be significant.    Karmen Bongo MD [facility]   How to contact the [facility] Attending or Consulting provider [place] or covering provider during after hours [place], for this patient?  1. Check the care team in [facility] and look for a) attending/consulting TRH provider listed and b) the [facility] team listed 2. Log into [email or web address] and use [place]'s universal password to access. If you do not have the password, please  contact the hospital operator. 3. Locate the [facility] provider you are looking for under  [facility] and page to a number that you can be directly reached. 4. If you still have difficulty reaching the provider, please page the [facility] (Director on Call) for the Hospitalists listed on amion for assistance.   07/13/2020, 2:13 PM

## 2020-07-13 NOTE — ED Notes (Addendum)
Patient transported to CT 

## 2020-07-14 DIAGNOSIS — F1910 Other psychoactive substance abuse, uncomplicated: Secondary | ICD-10-CM

## 2020-07-14 DIAGNOSIS — C7A8 Other malignant neuroendocrine tumors: Secondary | ICD-10-CM

## 2020-07-14 DIAGNOSIS — Z515 Encounter for palliative care: Secondary | ICD-10-CM

## 2020-07-14 DIAGNOSIS — C7B8 Other secondary neuroendocrine tumors: Secondary | ICD-10-CM

## 2020-07-14 DIAGNOSIS — I10 Essential (primary) hypertension: Secondary | ICD-10-CM

## 2020-07-14 LAB — T4, FREE: Free T4: 3.39 ng/dL — ABNORMAL HIGH (ref 0.61–1.12)

## 2020-07-14 LAB — GLUCOSE, CAPILLARY
Glucose-Capillary: 114 mg/dL — ABNORMAL HIGH (ref 70–99)
Glucose-Capillary: 187 mg/dL — ABNORMAL HIGH (ref 70–99)

## 2020-07-14 LAB — TSH: TSH: <0.01 u[IU]/mL — ABNORMAL LOW (ref 0.350–4.500)

## 2020-07-14 MED ORDER — LEVETIRACETAM 500 MG PO TABS: 500.0000 mg | ORAL_TABLET | Freq: Two times a day (BID) | ORAL | 2 refills | Status: DC

## 2020-07-14 MED ORDER — OXYCODONE-ACETAMINOPHEN 5-325 MG PO TABS
1.0000 | ORAL_TABLET | Freq: Three times a day (TID) | ORAL | 0 refills | Status: DC | PRN
Start: 2020-07-14 — End: 2020-07-18

## 2020-07-14 NOTE — Consult Note (Signed)
Consultation Note Date: 07/14/2020   Patient Name: Holly Weber  DOB: [birth year]  MRN: [id number]  Age / Sex: 54 y.o., female  PCP: Patient, No Pcp Per Referring Physician: Terrilee Croak, MD  Reason for Consultation: Establishing goals of care  HPI/Patient Profile: 54 y.o. female  with past medical history of CVA, hepatitis C, ulcerative colitis, polysubstance abuse, metastatic carcinoma  (03/2020) who was admitted on 07/13/2020 with seizures.  Imaging demonstrated possible brain mets.  Per the chart the patient received a compassionate release from prison early on 10/20 due to her metastatic cancer.  On admission she was positive for cocaine.  She was seen by Neurology and started on Keppra.  She was seen by Oncology who recommended against aggressive treatment and for the involvement of the Palliative Care Team for supportive measures. Of note she has a previous history of seizures when using cocaine.  Clinical Assessment and Goals of Care:  I have reviewed medical records including EPIC notes, labs and imaging, received report from the care team, examined the patient and talked with her to attempt to discuss diagnosis prognosis, GOC, EOL wishes, disposition and options.  I introduced Palliative Medicine as specialized medical care for people living with serious illness. It focuses on providing relief from the symptoms and stress of a serious illness.   We discussed a brief life review of the patient.  She was raised near [place].  She has multiple siblings and 7 children.  But the family member she appears to be the closest to is her father.  She mentioned several times how much she loved him and did not want to make him sad.  As far as functional and nutritional status Holly Weber is able to walk/talk and be independent.  We discussed her current illness and what it means in the larger context of her  on-going co-morbidities.  Natural disease trajectory and expectations at EOL were discussed.  Doing as well as she is now (walking, talking, without serious symptoms) I explained she could have some time - hopefully months.  I attempted to elicit values and goals of care important to the patient.  The difference between aggressive medical intervention and comfort care was considered in light of the patient's goals of care.  I asked Holly Weber about code status.  She quickly responded that she would want her father to make that decision.  I suggested that her father would want her input.  She said "Please save me, I don't want to make my Daddy sad."  Holly Weber is interested in following up with Oncology.  She tells me she has an appointment scheduled in November.  She would like support at home when she needs it, but at this point she is not of the hospice philosophy.  I spoke with Holly Weber's father Holly Weber on the phone.  Holly Weber is staying with him.  He was not surprised by her responses or our conversation.  He understands she unfortunately has a terminal illness.  When we talked about code status he replied that he's  been on life support before and he's not supposed to be here, but you never know.  I had to agree with him.   I encouraged him to have a conversation with Holly Weber.  At some point in the future her cancer will have advanced to the point that she could not be resuscitated and it would make it easier on both of them if they had a plan in place.  Mr. Giebel was very pleasant and thanked me for the call.   The family was encouraged to call with questions or concerns.        Primary Decision Maker:  PATIENT    SUMMARY OF RECOMMENDATIONS    Full code full scope.  Code Status/Advance Care Planning:  Full   Symptom Management:   Nothing to add at this point.  Additional Recommendations (Limitations, Scope, Preferences):  Full Scope Treatment   Psycho-social/Spiritual:   Desire for further  Chaplaincy support:  Patient is Holly Weber and believes in God.  Prognosis:  Less than 6 months without aggressive oncologic treatment or life support.    Discharge Planning: Home with Home Health      Primary Diagnoses: Present on Admission: . Tobacco abuse . HTN (hypertension) . Hyperglycemia . Polysubstance abuse ([place]) . Hyperthyroidism . 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 The following aspects are pertinent.  Past Medical History:  Diagnosis Date  . Colitis, ulcerative ([place])   . Depression   . Hepatitis C   . Hypertension   . Stroke ([place])   . Thyroid disease    Social History   Socioeconomic History  . Marital status: Widowed    Spouse name: Not on file  . Number of children: Not on file  . Years of education: Not on file  . Highest education level: Not on file  Occupational History  . Not on file  Tobacco Use  . Smoking status: Current Every Day Smoker    Packs/day: 1.50    Types: Cigarettes  . Smokeless tobacco: Never Used  Substance and Sexual Activity  . Alcohol use: Yes    Comment: Daily   . Drug use: Yes    Types: Cocaine, Marijuana    Comment: Daily on both  . Sexual activity: Yes    Birth control/protection: None  Other Topics Concern  . Not on file  Social History Narrative  . Not on file   Social Determinants of Health   Financial Resource Strain:   . Difficulty of Paying Living Expenses: Not on file  Food Insecurity:   . Worried About Charity fundraiser in the Last Year: Not on file  . Ran Out of Food in the Last Year: Not on file  Transportation Needs:   . Lack of Transportation (Medical): Not on file  . Lack of Transportation (Non-Medical): Not on file  Physical Activity:   . Days of Exercise per Week: Not on file  . Minutes of Exercise per Session: Not on file  Stress:   . Feeling of Stress : Not on  file  Social Connections:   . Frequency of Communication with Friends and Family: Not on file  . Frequency of Social Gatherings with Friends and Family: Not on file  . Attends Religious Services: Not on file  . Active Member of Clubs or Organizations: Not on file  . Attends Archivist Meetings: Not on file  . Marital Status:  Not on file   Family History  Problem Relation Age of Onset  . Hypertension Mother   . Hypertension Father     Allergies  Allergen Reactions  . Asa [Aspirin] Other (See Comments)    Ulcerative colitis flares.  . Nsaids Other (See Comments)    Ulcerative colitis flares  . Penicillins Rash and Other (See Comments)    Has patient had a PCN reaction causing immediate rash, facial/tongue/throat swelling, SOB or lightheadedness with hypotension:unsure Has patient had a PCN reaction causing severe rash involving mucus membranes or skin necrosis:unsure Has patient had a PCN reaction that required hospitalization:No Has patient had a PCN reaction occurring within the last 10 years:Yes If all of the above answers are "NO", then may proceed with Cephalosporin use.     Vital Signs: BP 122/75 (BP Location: Right Arm)   Pulse (!) 112   Temp 98.2 F (36.8 C)   Resp 18   Ht 5' 5"  (1.651 m)   Wt 77.1 kg   SpO2 95%   BMI 28.29 kg/m  Pain Scale: 0-10 POSS *See Group Information*: 1-Acceptable,Awake and alert Pain Score: 6    SpO2: SpO2: 95 % O2 Device:SpO2: 95 % O2 Flow Rate: .     Palliative Assessment/Data: 70%     Time In: 9:00 Time Out: 9:50 Time Total: 50 min. Visit consisted of counseling and education dealing with the complex and emotionally intense issues surrounding the need for palliative care and symptom management in the setting of serious and potentially life-threatening illness. Greater than 50%  of this time was spent counseling and coordinating care related to the above assessment and plan.  Signed by: Florentina Jenny,  PA-C Palliative Medicine  Please contact Palliative Medicine Team phone at [phone] for questions and concerns.  For individual provider: See Shea Evans

## 2020-07-14 NOTE — Consult Note (Signed)
Neurology progress note  Subjective: No acute changes overnight.  Reports he is running out of her pain medications at home. More awake and cooperative with the exam. History is still not very coherent  Past Medical History:  Diagnosis Date  . Colitis, ulcerative ([place])   . Depression   . Hepatitis C   . Hypertension   . Stroke ([place])   . Thyroid disease     Family History  Problem Relation Age of Onset  . Hypertension Mother   . Hypertension Father    Social History:   reports that she has been smoking cigarettes. She has been smoking about 1.50 packs per day. She has never used smokeless tobacco. She reports current alcohol use. She reports current drug use. Drugs: Cocaine and Marijuana.  Medications  Current Facility-Administered Medications:  .  0.9 %  sodium chloride infusion, 75 mL/hr, Intravenous, Continuous, Karmen Bongo, MD, Last Rate: 75 mL/hr at 07/13/20 1108, 75 mL/hr at 07/13/20 1108 .  acetaminophen (TYLENOL) tablet 650 mg, 650 mg, Oral, Q4H PRN, 650 mg at 07/14/20 0957 **OR** acetaminophen (TYLENOL) suppository 650 mg, 650 mg, Rectal, Q4H PRN, Karmen Bongo, MD .  docusate sodium (COLACE) capsule 100 mg, 100 mg, Oral, BID, Karmen Bongo, MD, 100 mg at 07/14/20 0914 .  enoxaparin (LOVENOX) injection 40 mg, 40 mg, Subcutaneous, Q24H, Karmen Bongo, MD, 40 mg at 07/14/20 0915 .  insulin aspart (novoLOG) injection 0-15 Units, 0-15 Units, Subcutaneous, TID WC, Karmen Bongo, MD .  insulin aspart (novoLOG) injection 0-5 Units, 0-5 Units, Subcutaneous, QHS, Karmen Bongo, MD .  levETIRAcetam (KEPPRA) IVPB 500 mg/100 mL premix, 500 mg, Intravenous, Q12H, Marliss Coots, PA-C, Last Rate: 400 mL/hr at 07/14/20 0925, 500 mg at 07/14/20 0925 .  LORazepam (ATIVAN) injection 1-2 mg, 1-2 mg, Intravenous, Q2H PRN, Karmen Bongo, MD, 2 mg at 07/13/20 1114 .  nicotine (NICODERM CQ - dosed in mg/24 hours) patch 14 mg, 14 mg, Transdermal, Daily, Karmen Bongo, MD, 14 mg  at 07/14/20 0915 .  ondansetron (ZOFRAN) tablet 4 mg, 4 mg, Oral, Q6H PRN **OR** ondansetron (ZOFRAN) injection 4 mg, 4 mg, Intravenous, Q6H PRN, Karmen Bongo, MD .  polyethylene glycol (MIRALAX / GLYCOLAX) packet 17 g, 17 g, Oral, Daily PRN, Karmen Bongo, MD  ROS:   General ROS: negative for - chills, fatigue, fever, night sweats, weight gain or weight loss Psychological ROS: negative for - behavioral disorder, hallucinations, memory difficulties, mood swings or suicidal ideation Ophthalmic ROS: negative for - blurry vision, double vision, eye pain or loss of vision ENT ROS: negative for - epistaxis, nasal discharge, oral lesions, sore throat, tinnitus or vertigo Respiratory ROS: negative for - cough, hemoptysis, shortness of breath or wheezing Cardiovascular ROS: negative for - chest pain, dyspnea on exertion, edema or irregular heartbeat Gastrointestinal ROS: Positive for - abdominal pain Genito-Urinary ROS: negative for - dysuria, hematuria, incontinence or urinary frequency/urgency Musculoskeletal ROS: Positive for -muscular pain Neurological ROS: as noted in HPI Dermatological ROS: negative for rash and skin lesion changes  Exam: Current vital signs: BP 122/75 (BP Location: Right Arm)   Pulse (!) 112   Temp 98.2 F (36.8 C)   Resp 18   Ht 5' 5"  (1.651 m)   Wt 77.1 kg   SpO2 95%   BMI 28.29 kg/m  Vital signs in last 24 hours: Temp:  [98.2 F (36.8 C)-98.6 F (37 C)] 98.2 F (36.8 C) (10/23 0811) Pulse Rate:  [112-125] 112 (10/23 0815) Resp:  [17-40] 18 (10/23 0815) BP: (  122-174)/(60-95) 122/75 (10/23 0811) SpO2:  [90 %-97 %] 95 % (10/23 0815) General: Awake alert in no distress HEENT: Missing hair on the back part of the head, normocephalic, atraumatic, dry mucous membranes CVS: Regular rate rhythm Respiratory: Breathing well saturating normally on room air Neurological exam Awake alert oriented x2 Speech is not dysarthric No aphasia Cranial nerves: Pupils  equal round react light, extraocular movements intact, visual field full, face symmetric, tongue and palate midline. Motor exam: Symmetric antigravity all fours Sensory exam: Intact light touch all over Coordination: No obvious dysmetria. Labs I have reviewed labs in epic and the results pertinent to this consultation are:   CBC    Component Value Date/Time   WBC 13.4 (H) 07/13/2020 0534   RBC 5.94 (H) 07/13/2020 0534   HGB 16.7 (H) 07/13/2020 0605   HGB 14.4 01/01/2017 1049   HCT 49.0 (H) 07/13/2020 0605   HCT 42.3 01/01/2017 1049   PLT 257 07/13/2020 0534   PLT 391 (H) 01/01/2017 1049   MCV 81.6 07/13/2020 0534   MCV 77 (L) 01/01/2017 1049   MCH 26.1 07/13/2020 0534   MCHC 32.0 07/13/2020 0534   RDW 14.8 07/13/2020 0534   RDW 14.1 01/01/2017 1049   LYMPHSABS 3.2 07/13/2020 0534   LYMPHSABS 1.2 01/01/2017 1049   MONOABS 1.2 (H) 07/13/2020 0534   EOSABS 0.8 (H) 07/13/2020 0534   EOSABS 0.0 01/01/2017 1049   BASOSABS 0.1 07/13/2020 0534   BASOSABS 0.1 01/01/2017 1049    CMP     Component Value Date/Time   NA 136 07/13/2020 0630   K 4.1 07/13/2020 0630   CL 106 07/13/2020 0630   CO2 23 07/13/2020 0630   GLUCOSE 195 (H) 07/13/2020 0630   BUN 10 07/13/2020 0630   CREATININE 0.54 07/13/2020 0630   CALCIUM 8.9 07/13/2020 0630   PROT 7.4 07/13/2020 0630   ALBUMIN 3.3 (L) 07/13/2020 0630   AST 31 07/13/2020 0630   ALT 27 07/13/2020 0630   ALKPHOS 127 (H) 07/13/2020 0630   BILITOT 1.9 (H) 07/13/2020 0630   GFRNONAA >60 07/13/2020 0630   GFRAA >60 10/21/2018 2150    Imaging I have reviewed the images obtained: CT-scan of the brain-redemonstrated small chronic infarct within the superior aspect of the paramedian right occipital lobe.  No evidence of acute intracranial abnormality.  No evidence of PRES  MR brain with scattered T2 hyperintensities but no clear lesion with mass-effect to suggest metastases although the MR study was done without contrast due to patient  cooperation.  No evidence of PR ES. Detailed review with radiologist with possible T2 hyperintensities in the right hemisphere without any mass-effect.  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   Much more awake today than yesterday.  But history still mixed up in her mind. No further seizures noted. Likely provoked seizure from cocaine use hypertension as well as possibly due to the metastases, although the brain MRI is not very impressive for big metastases-with the caveat that it was a noncontrast MRI. Oncology does not recommend inpatient treatment due to this cancer being very aggressive and predictable response to treatment.  Recommendations: -Try to obtain MRI brain with contrast for better look for small metastases.  She is amenable to doing it with some IV Ativan. --No need for LTM --Keppra 500 mg BID --Maintain seizure precautions --Ativan for any seizure lasting more than 5  minutes. --Palliative care consultation --Pain medication management per primary team  -- Amie Portland, MD [facility] Pager: [phone] If 7pm to 7am, please call on call as listed on AMION.

## 2020-07-14 NOTE — TOC Initial Note (Signed)
Transition of Care [facility]) - Initial/Assessment Note    Patient Details  Name: Holly Weber MRN: [id number] Date of Birth: 1965-11-07  [facility]) CM/SW Contact:    Elliot Gurney [place], [place] Phone Number: [phone] 07/14/2020, 1:41 PM  Clinical Narrative:                 Patient is a 54 year old femalewith medical history significantfor HTN, CVA, hypothyroidism, polysubstance abuse including cocaine, hepatitis C and possible brain mets from small cell lung cancer. This Education officer, museum met with patient to offer emotional support and to assess for community resource needs. Patient pleasant at bedside, forthcoming about drug use and states no plans to end use. Patient kindly declined resources. Patient states that she has a good support system and will be discharging to her father's home. He will provide transport home for her. She is uninsured and does not have a PCP. RNCM contacted for possible resources.  Juliona Vales, LCSW Transitions of Care [phone]   Expected Discharge Plan: Home/Self Care Barriers to Discharge: Continued Medical Work up   Patient Goals and CMS Choice Patient states their goals for this hospitalization and ongoing recovery are:: To go home and be comfortable      Expected Discharge Plan and Services Expected Discharge Plan: Home/Self Care         Expected Discharge Date: 07/14/20                 DME Agency: NA                  Prior Living Arrangements/Services   Lives with:: Parents Patient language and need for interpreter reviewed:: No Do you feel safe going back to the place where you live?: Yes      Need for Family Participation in Patient Care: No (Comment) Care giver support system in place?: Yes (comment)   Criminal Activity/Legal Involvement Pertinent to Current Situation/Hospitalization: No - Comment as needed  Activities of Daily Living Home Assistive Devices/Equipment: Eyeglasses, Gilford Rile (specify type)  (front wheeled walker) ADL Screening (condition at time of admission) Patient's cognitive ability adequate to safely complete daily activities?: No (patient drowsy) Is the patient deaf or have difficulty hearing?: No Does the patient have difficulty seeing, even when wearing glasses/contacts?: No Does the patient have difficulty concentrating, remembering, or making decisions?: Yes Patient able to express need for assistance with ADLs?: Yes Independently performs ADLs?: No Communication: Independent Dressing (OT): Needs assistance Is this a change from baseline?: Change from baseline, expected to last >3 days Grooming: Needs assistance Is this a change from baseline?: Change from baseline, expected to last >3 days Feeding: Needs assistance Is this a change from baseline?: Change from baseline, expected to last >3 days Bathing: Needs assistance Is this a change from baseline?: Change from baseline, expected to last >3 days Toileting: Needs assistance Is this a change from baseline?: Change from baseline, expected to last >3days In/Out Bed: Needs assistance Is this a change from baseline?: Change from baseline, expected to last >3 days Walks in Home: Needs assistance Is this a change from baseline?: Change from baseline, expected to last >3 days Does the patient have difficulty walking or climbing stairs?: Yes (secondary to weakness and drowsiness) Weakness of Legs: Both Weakness of Arms/Hands: None  Permission Sought/Granted      Share Information with NAME: Cattie Tineo-     Permission granted to share info w Relationship: father  Permission granted to share info w Contact Information:  [phone]  Emotional Assessment Appearance:: Appears older than stated age Attitude/Demeanor/Rapport: Engaged Affect (typically observed): Sad Orientation: : Oriented to Self, Oriented to Place, Oriented to  Time, Oriented to Situation Alcohol / Substance Use: Illicit Drugs Psych Involvement:  No (comment)  Admission diagnosis:  Hypertensive encephalopathy [I67.4] Seizure ([place]) [R56.9] Seizure-like activity ([place]) [R56.9] Community acquired pneumonia, unspecified laterality [J18.9] Patient Active Problem List   Diagnosis Date Noted  . Seizure ([place]) 07/13/2020  . Hyperthyroidism 07/13/2020  . 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 Crack cocaine overdose ([place])   . Cocaine overdose ([place]) 10/18/2018  . Cellulitis 10/18/2018  . Hypokalemia 10/18/2018  . MSSA (methicillin susceptible Staphylococcus aureus) infection 12/27/2016  . Bacteremia 12/22/2016  . Polysubstance abuse ([place]) 12/22/2016  . Osteomyelitis ([place]) 12/22/2016  . Hyponatremia 12/22/2016  . Osteomyelitis of left hip ([place])   . Sacroiliitis ([place])   . Staphylococcus aureus bacteremia 12/15/2016  . Pneumonia 12/15/2016  . Hyperglycemia 12/15/2016  . Normocytic anemia 12/15/2016  . IV drug user 12/15/2016  . Acute left-sided low back pain with left-sided sciatica 12/13/2016  . Cocaine abuse ([place]) 12/13/2016  . Tobacco abuse 12/13/2016  . HTN (hypertension) 12/13/2016   PCP:  Patient, No Pcp Per Pharmacy:   [id number] [place] [address] [address]Phone: [phone]: [phone]    Social Determinants of Health (SDOH) Interventions    Readmission Risk Interventions No flowsheet data found.

## 2020-07-14 NOTE — Discharge Instructions (Signed)
Per [facility] statutes, patients with seizures are not allowed to drive until  they have been seizure-free for six months. Use caution when using heavy equipment or power tools. Avoid working on ladders or at heights. Take showers instead of baths. Ensure the water temperature is not too high on the home water heater. Do not go swimming alone. When caring for infants or small children, sit down when holding, feeding, or changing them to minimize risk of injury to the child in the event you have a seizure.  Maintain good sleep hygiene. Avoid alcohol.

## 2020-07-14 NOTE — TOC Transition Note (Signed)
Transition of Care [facility]) - CM/SW Discharge Note   Patient Details  Name: Holly Weber MRN: [id number] Date of Birth: 1965-10-11  Transition of Care [facility]) CM/SW Contact:  Carles Collet, RN Phone Number: 07/14/2020, 2:19 PM   Clinical Narrative:    Damaris Schooner w patient and her father at the bedside. She will DC to address on file, father will care for her.  We discussed Rxs, given good RX coupon, and texted the app to add to her phone. We discussed her DC plan and she explained her prognosis as weeks to live. CM identified that her father may need help in her care. We discussed a referral to home hospice. Patient very appreciative. Referral placed to [facility].  Patient requested script for pain meds, spoke w MD who sent script in. CM updated MD on plan for home hospice.     Final next level of care: Home w Hospice Care Barriers to Discharge: No Barriers Identified   Patient Goals and CMS Choice Patient states their goals for this hospitalization and ongoing recovery are:: To go home and be comfortable      Discharge Placement                       Discharge Plan and Services                  DME Agency: NA         [facility] Agency: Hospice and [place] Date [place]: 07/14/20 Time [place]: 1416 Representative spoke with at [place]: [place] ([place]) Interventions     Readmission Risk Interventions No flowsheet data found.

## 2020-07-14 NOTE — Care Management (Signed)
Patient discharged with Good Rx coupon for Keppra

## 2020-07-14 NOTE — Progress Notes (Signed)
PIVs removed. Went over DC paperwork with patient and her father. Patient dressed and has all belongings with her. Wheeled to DC area in wheelchair via tech. No distress noted.

## 2020-07-14 NOTE — Discharge Summary (Signed)
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 Patient was brought to the ED on 10/22 for witnessed seizures. Patient reportedly has history of seizure however this was a long time ago and she is not on any antiseizure medications.  She  admitted to using cocaine the night before presentation and complaint of abdominal pain at presentation.  In the ED, she had blood pressure elevated to 187/89, tachycardic to 120s. CT-scan of the brain-redemonstrated small chronic infarct within the superior aspect of the paramedian right occipital lobe. No evidence of acute intracranial abnormality. No evidence of PRES  MR brain withscattered T2 hyperintensities but no clear lesion with mass-effect to suggest metastases although the MR study was done without contrast due to patient cooperation. EEG revealed slowing suggestive of moderate diffuse encephalopathy nonspecific in etiology. No seizures or definitive epileptiform discharges seen.  Subjective:  Seen and examined this morning. Lying on bed. Has episodes of cough, otherwise asymptomatic. He is frustrated and tearful talk about the diagnosis of incurable cancer. She understands that she needs  to quit smoking in cocaine but at the same time states that she has only few weeks to months left to live and would like to add some quality to it, She is physically able to walk independently.  Hospital Course:  Seizure -Likely due to use of crack cocaine. -EEG with moderate diffuse encephalopathy.  No definite epileptiform discharges. -Seen by neurology. Started on Keppra. -Seizure precautions Per [facility] statutes, patients with seizures are not allowed to drive until  they have been seizure-free for six months. Use caution when using heavy equipment or power tools. Avoid working on ladders or at heights. Take showers instead of baths. Ensure the water temperature is not too high on the home water heater. Do not go swimming alone. When caring for infants or small children, sit down when holding, feeding, or changing them to minimize risk of injury to the child in the event you have a seizure.  Maintain good sleep hygiene. Avoid alcohol.  Metastatic carcinoma with neuroendocrine features. -Seen by oncologist Dr. Alen Blew -Outside records from [facility] [place] were received. -MRI was suspicious for intracranial mets with at least 2 R hemispheric lesions up to 5 mm in the parietal lobe without mass effect/edema.  -She received 1 cycle of carboplatin and etoposide on 04/14/2020 while hospitalized.  [facility] [place] and was advised to follow-up with medical and radiation oncology in [place], [place] but it is not clear if she followed up as advised.  -Per oncology note, she missed several appointments in the cancer center in the past.  Based on the extent of her metastatic disease in the setting of poor compliance, polysubstance abuse, subsequent chemotherapy would offer very little palliation, this disease is incurable and any treatment with  palliative at the best. -Patient in her father are aware of the severity of her diagnosis.  At this  time, she would like to get discharged back to her father's house.  I have ordered for palliative care referral as an outpatient.  She will also follow-up with oncology as an outpatient if she wants to.  Polysubstance abuse -Patient with long-standing drug abuse/dependence - clearly with cocaine overdose in the past and also with reported IVDA -Many of her convictions in criminal court have been for drug-related issues -Counseled to quit drug abuse.  HTN -She does not appear to be taking medications for this issue at this time -Patient was hypertensive on arrival which could be secondary to cocaine use. -Continue to monitor blood pressure at home.  Hyperthyroidism -No longer taking thyroid medications  Hyperglycemia -No known h/o DM. -A1c 5.8 in 2018.  Tobacco abuse -Encourage cessation.   Okay to discharge home with father.  Wound care:    Discharge Exam:   Vitals:   07/13/20 2057 07/14/20 0517 07/14/20 0811 07/14/20 0815  BP: (!) 159/77 (!) 153/80 122/75   Pulse: (!) 125 (!) 115 (!) 115 (!) 112  Resp: 18 17 18 18   Temp: 98.6 F (37 C) 98.5 F (36.9 C) 98.2 F (36.8 C)   TempSrc: Oral Oral    SpO2: 96% 95% 95% 95%  Weight:      Height:        Body mass index is 28.29 kg/m.  General exam: Appears calm and comfortable.  Not in distress Skin: No rashes, lesions or ulcers. HEENT: Atraumatic, normocephalic, supple neck, no obvious bleeding Lungs: Clear to auscultation bilaterally CVS: Regular rate and rhythm, no murmur GI/Abd soft, nontender, nondistended, bowel sound present CNS: Alert, awake, oriented x3 Psychiatry: Mood appropriate Extremities: No pedal edema, no calf tenderness  Follow ups:   Discharge Instructions    Amb Referral to Palliative Care   Complete by: As directed    Diet - low sodium heart healthy   Complete by: As directed    Increase activity slowly   Complete by: As directed       Follow-up [facility] Follow up.   Contact information: [address] [place] [address] [phone]       Wyatt Portela, MD Follow up.   Specialty: Oncology Contact information: [place] [address] [phone]               Recommendations for Outpatient Follow-Up:   1. Follow-up with PCP/oncology/charity care as an outpatient  Discharge Instructions:  Follow with Primary MD Patient, No Pcp Per in 7 days   Get CBC/BMP checked in next visit within 1 week by PCP or SNF MD ( we routinely change or add medications that can affect your baseline labs and fluid status, therefore we recommend that you get the mentioned basic workup next visit with your PCP, your PCP may decide not to get them or add new tests based on their clinical decision)  On your next visit with your PCP, please Get Medicines reviewed and adjusted.  Please request your PCP  to go over all Hospital Tests and Procedure/Radiological results at the follow up, please get all Hospital records sent to your Prim MD by signing hospital release before you go home.  Activity: As tolerated with Full fall precautions use walker/cane & assistance as needed  For Heart failure patients - Check your Weight  same time everyday, if you gain over 2 pounds, or you develop in leg swelling, experience more shortness of breath or chest pain, call your Primary MD immediately. Follow Cardiac Low Salt Diet and 1.5 lit/day fluid restriction.  If you have smoked or chewed Tobacco in the last 2 yrs please stop smoking, stop any regular Alcohol  and or any Recreational drug use.  If you experience worsening of your admission symptoms, develop shortness of breath, life threatening emergency, suicidal or homicidal thoughts you must seek medical attention immediately by calling 911 or calling your MD immediately  if symptoms less severe.  You Must read complete  instructions/literature along with all the possible adverse reactions/side effects for all the Medicines you take and that have been prescribed to you. Take any new Medicines after you have completely understood and accpet all the possible adverse reactions/side effects.   Do not drive, operate heavy machinery, perform activities at heights, swimming or participation in water activities or provide baby sitting services if your were admitted for syncope or siezures until you have seen by Primary MD or a Neurologist and advised to do so again.  Do not drive when taking Pain medications.  Do not take more than prescribed Pain, Sleep and Anxiety Medications  Wear Seat belts while driving.   Please note You were cared for by a hospitalist during your hospital stay. If you have any questions about your discharge medications or the care you received while you were in the hospital after you are discharged, you can call the unit and asked to speak with the hospitalist on call if the hospitalist that took care of you is not available. Once you are discharged, your primary care physician will handle any further medical issues. Please note that NO REFILLS for any discharge medications will be authorized once you are discharged, as it is imperative that you return to your primary care physician (or establish a relationship with a primary care physician if you do not have one) for your aftercare needs so that they can reassess your need for medications and monitor your lab values.    Allergies as of 07/14/2020      Reactions   Asa [aspirin] Other (See Comments)   Ulcerative colitis flares.   Nsaids Other (See Comments)   Ulcerative colitis flares   Penicillins Rash, Other (See Comments)   Has patient had a PCN reaction causing immediate rash, facial/tongue/throat swelling, SOB or lightheadedness with hypotension:unsure Has patient had a PCN reaction causing severe rash involving mucus membranes or skin  necrosis:unsure Has patient had a PCN reaction that required hospitalization:No Has patient had a PCN reaction occurring within the last 10 years:Yes If all of the above answers are "NO", then may proceed with Cephalosporin use.      Medication List    TAKE these medications   levETIRAcetam 500 MG tablet Commonly known as: Keppra Take 1 tablet (500 mg total) by mouth 2 (two) times daily.       Time coordinating discharge: 35 minutes  The results of significant diagnostics from this hospitalization (including imaging, microbiology, ancillary and laboratory) are listed below for reference.    Procedures and Diagnostic Studies:   CT Head Wo Contrast  Result Date: 07/13/2020 CLINICAL DATA:  Seizure, abnormal neuro exam. Additional history provided: Witness seizure EXAM: CT HEAD WITHOUT CONTRAST TECHNIQUE: Contiguous axial images were obtained from the base of the skull through the vertex without intravenous contrast. COMPARISON:  Prior head CT 10/21/2018.  Prior brain  MRI 10/21/2018. FINDINGS: Brain: Cerebral volume is normal for age. Redemonstrated small chronic infarct within the superior aspect of the paramedian right occipital lobe (for instance as seen on series 2, image 19) (series 5, image 20). Mild ill-defined hypoattenuation within the cerebral white matter is nonspecific, but compatible with chronic small vessel ischemic disease. There is no acute intracranial hemorrhage. No acute demarcated cortical infarct. No extra-axial fluid collection. No evidence of intracranial mass. No midline shift. Vascular: No hyperdense vessel.  Atherosclerotic calcifications. Skull: Normal. Negative for fracture or focal lesion. Sinuses/Orbits: Visualized orbits show no acute finding. No significant paranasal sinus disease or mastoid effusion at the imaged levels. IMPRESSION: No evidence of acute intracranial abnormality. Redemonstrated small chronic cortical infarct within the paramedian right occipital  lobe. Mild cerebral white matter chronic small vessel ischemic disease, stable as compared to the brain MRI of 10/21/2018. Electronically Signed   By: Kellie Simmering DO   On: 07/13/2020 07:53   MR BRAIN WO CONTRAST  Addendum Date: 07/13/2020   ADDENDUM REPORT: 07/13/2020 14:41 ADDENDUM: Dr. Malen Gauze called to discuss this case. Apparently there was a scan done in July in [facility] which suggested two metastatic lesions. We do not have those images for comparison. With this in mind, there are 2 small foci of T2 and FLAIR signal that were not definitely present in January of this year. One is a 4 mm focus in the right occipital region axial FLAIR image 20 and the other is a 3 mm focus of signal in the subcortical U fibers of the right posterior temporal lobe axial FLAIR image 22. Neither of these has mass effect or vasogenic edema. Attention to these areas if we can get a contrast enhanced scan. Electronically Signed   By: Nelson Chimes M.D.   On: 07/13/2020 14:41   Result Date: 07/13/2020 CLINICAL DATA:  Seizure after cocaine use. EXAM: MRI HEAD WITHOUT CONTRAST TECHNIQUE: Multiplanar, multiecho pulse sequences of the brain and surrounding structures were obtained without intravenous contrast. COMPARISON:  Head CT same day.  MRI 10/21/2018. FINDINGS: Brain: The study suffers from motion degradation. Diffusion imaging does not show any acute or subacute infarction or other cause of restricted diffusion. No focal abnormality affects the brainstem or cerebellum. There is an old cortical and subcortical infarction scratched at there are old cortical and subcortical infarctions in the right occipital lobe. Chronic small-vessel ischemic changes affect the hemispheric white matter particularly in the frontal lobes. No mass lesion, hemorrhage, hydrocephalus or extra-axial collection. Vascular: Major vessels at the base of the brain show flow. Skull and upper cervical spine: Negative Sinuses/Orbits:  Clear/normal Other: None IMPRESSION: Motion degraded exam. No acute or reversible finding. Old right occipital cortical and subcortical infarctions. Chronic small-vessel ischemic changes of the hemispheric white matter. Electronically Signed: By: Nelson Chimes M.D. On: 07/13/2020 12:31   DG Chest Portable 1 View  Result Date: 07/13/2020 CLINICAL DATA:  Seizures EXAM: PORTABLE CHEST 1 VIEW COMPARISON:  12/13/2016 FINDINGS: Shallow inspiration. Heart size is normal. Bilateral basilar infiltrates could represent pneumonia or edema. No pleural effusions. No pneumothorax. Right power port type central venous catheter with tip over the low SVC region. IMPRESSION: Bilateral basilar infiltrates. Electronically Signed   By: Lucienne Capers M.D.   On: 07/13/2020 06:39   EEG adult  Result Date: 07/13/2020 Lora Havens, MD     07/13/2020 11:20 AM Patient Name: Holly Weber MRN: [id number] Epilepsy Attending: Lora Havens Referring Physician/Provider: Dr. Karmen Bongo Date: 07/12/2020 Duration:  23.07 minutes Patient history: 54 year old female presented to the hospital with seizures in the setting of cocaine use.  EEG to evaluate for seizures. Level of alertness: Awake, asleep AEDs during EEG study: LEV Technical aspects: This EEG study was done with scalp electrodes positioned according to the 10-20 International system of electrode placement. Electrical activity was acquired at a sampling rate of 500Hz  and reviewed with a high frequency filter of 70Hz  and a low frequency filter of 1Hz . EEG data were recorded continuously and digitally stored. Description: During awake state, no clear posterior dominant rhythm was seen.  Sleep was characterized by sleep spindles (12 to 14 Hz), maximal frontocentral region.  EEG showed continuous generalized polymorphic 5 to 6 Hz theta slowing and intermittent 2 to 3 Hz delta slowing. Hyperventilation and photic stimulation were not performed.   ABNORMALITY -Continuous  slow, generalized IMPRESSION: This study is  suggestive of moderate diffuse encephalopathy, nonspecific etiology but likely related to sedation, post-ictal state. No seizures or definite epileptiform discharges were seen throughout the recording. [place]:   Basic Metabolic Panel: Recent Labs  Lab 07/13/20 0605 07/13/20 0630  NA 137 136  K 4.7 4.1  CL 106 106  CO2  --  23  GLUCOSE 229* 195*  BUN 8 10  CREATININE 0.40* 0.54  CALCIUM  --  8.9   GFR Estimated Creatinine Clearance: 83.5 mL/min (by C-G formula based on SCr of 0.54 mg/dL). Liver Function Tests: Recent Labs  Lab 07/13/20 0630  AST 31  ALT 27  ALKPHOS 127*  BILITOT 1.9*  PROT 7.4  ALBUMIN 3.3*   No results for input(s): LIPASE, AMYLASE in the last 168 hours. No results for input(s): AMMONIA in the last 168 hours. Coagulation profile No results for input(s): INR, PROTIME in the last 168 hours.  CBC: Recent Labs  Lab 07/13/20 0534 07/13/20 0605  WBC 13.4*  --   NEUTROABS 7.9*  --   HGB 15.5* 16.7*  HCT 48.5* 49.0*  MCV 81.6  --   PLT 257  --    Cardiac Enzymes: No results for input(s): CKTOTAL, CKMB, CKMBINDEX, TROPONINI in the last 168 hours. BNP: Invalid input(s): POCBNP CBG: Recent Labs  Lab 07/13/20 2117 07/14/20 0812  GLUCAP 120* 114*   D-Dimer No results for input(s): DDIMER in the last 72 hours. Hgb A1c No results for input(s): HGBA1C in the last 72 hours. Lipid Profile No results for input(s): CHOL, HDL, LDLCALC, TRIG, CHOLHDL, LDLDIRECT in the last 72 hours. Thyroid function studies No results for input(s): TSH, T4TOTAL, T3FREE, THYROIDAB in the last 72 hours.  Invalid input(s): FREET3 Anemia work up No results for input(s): VITAMINB12, FOLATE, FERRITIN, TIBC, IRON, RETICCTPCT in the last 72 hours. Microbiology Recent Results (from the past 240 hour(s))  Respiratory Panel by RT PCR (Flu A&B, Covid) - Nasopharyngeal Swab     Status: None   Collection Time:  07/13/20  6:21 AM   Specimen: Nasopharyngeal Swab  Result Value Ref Range Status   SARS Coronavirus 2 by RT PCR NEGATIVE NEGATIVE Final    Comment: (NOTE) SARS-CoV-2 target nucleic acids are NOT DETECTED.  The SARS-CoV-2 RNA is generally detectable in upper respiratoy specimens during the acute phase of infection. The lowest concentration of SARS-CoV-2 viral copies this assay can detect is 131 copies/mL. A negative result does not preclude SARS-Cov-2 infection and should not be used as the sole basis for treatment or other patient management decisions. A negative result may occur with  improper specimen collection/handling, submission of specimen other than nasopharyngeal swab, presence of viral mutation(s) within the areas targeted by this assay, and inadequate number of viral copies (<131 copies/mL). A negative result must be combined with clinical observations, patient history, and epidemiological information. The expected result is Negative.  Fact Sheet for Patients:  PinkCheek.be  Fact Sheet for Healthcare Providers:  GravelBags.it  This test is no t yet approved or cleared by the [place] FDA and  has been authorized for detection and/or diagnosis of SARS-CoV-2 by FDA under an Emergency Use Authorization (EUA). This EUA will remain  in effect (meaning this test can be used) for the duration of the COVID-19 declaration under Section 564(b)(1) of the Act, 21 U.S.C. section 360bbb-3(b)(1), unless the authorization is terminated or revoked sooner.     Influenza A by PCR NEGATIVE NEGATIVE Final   Influenza B by PCR NEGATIVE NEGATIVE Final    Comment: (NOTE) The Xpert Xpress SARS-CoV-2/FLU/RSV assay is intended as an aid in  the diagnosis of influenza from Nasopharyngeal swab specimens and  should not be used as a sole basis for treatment. Nasal washings and  aspirates are unacceptable for Xpert Xpress  SARS-CoV-2/FLU/RSV  testing.  Fact Sheet for Patients: PinkCheek.be  Fact Sheet for Healthcare Providers: GravelBags.it  This test is not yet approved or cleared by the [place] FDA and  has been authorized for detection and/or diagnosis of SARS-CoV-2 by  FDA under an Emergency Use Authorization (EUA). This EUA will remain  in effect (meaning this test can be used) for the duration of the  Covid-19 declaration under Section 564(b)(1) of the Act, 21  U.S.C. section 360bbb-3(b)(1), unless the authorization is  terminated or revoked. Performed at [facility], [place] [address] [place], [place] [address]   Blood culture (routine x 2)     Status: None (Preliminary result)   Collection Time: 07/13/20  7:17 AM   Specimen: BLOOD  Result Value Ref Range Status   Specimen Description   Final    BLOOD RIGHT ANTECUBITAL Performed at [facility] [address], [place], [place] [address]    Special Requests   Final    BOTTLES DRAWN AEROBIC ONLY Blood Culture results may not be optimal due to an inadequate volume of blood received in culture bottles Performed at [facility] [address] [place], [place] [address]    Culture   Final    NO GROWTH < 24 HOURS Performed at [facility] [address] [place], [place] [address]    Report Status PENDING  Incomplete  Blood culture (routine x 2)     Status: None (Preliminary result)   Collection Time: 07/13/20  7:17 AM   Specimen: BLOOD  Result Value Ref Range Status   Specimen Description   Final    BLOOD LEFT ANTECUBITAL Performed at [facility] [address] [place], [place] [address]    Special Requests   Final    BOTTLES DRAWN AEROBIC AND ANAEROBIC Blood Culture adequate volume Performed at [facility] [address] [place], [place] [address]    Culture   Final     NO GROWTH < 24 HOURS Performed at [facility] [address] [place], [place] [address]    Report Status PENDING  Incomplete     Signed: Marlowe Aschoff Odalys Win  [facility] 07/14/2020, 11:56 AM

## 2020-07-14 NOTE — Progress Notes (Signed)
Called by Dr. Pietro Cassis.  Patient has already been told by oncology that the disease is incurable.  She is not interested in pursuing any further work-up. She can be discharged on Keppra 500 twice daily to prevent seizures. If she is willing to stay for palliative care consult, that should be considered. She can follow-up with outpatient neurology if desired. -- Amie Portland, MD [facility] Pager: [phone] If 7pm to 7am, please call on call as listed on AMION.

## 2020-07-16 LAB — HEMOGLOBIN A1C
Hgb A1c MFr Bld: 7 % — ABNORMAL HIGH (ref 4.8–5.6)
Mean Plasma Glucose: 154 mg/dL

## 2020-07-17 ENCOUNTER — Emergency Department (HOSPITAL_COMMUNITY): Payer: Self-pay

## 2020-07-17 ENCOUNTER — Other Ambulatory Visit: Payer: Self-pay

## 2020-07-17 ENCOUNTER — Observation Stay (HOSPITAL_COMMUNITY)
Admission: EM | Admit: 2020-07-17 | Discharge: 2020-07-18 | Disposition: A | Discharge status: Home / Self Care | Payer: Self-pay | Attending: Internal Medicine | Admitting: Internal Medicine

## 2020-07-17 ENCOUNTER — Encounter (HOSPITAL_COMMUNITY): Payer: Self-pay

## 2020-07-17 DIAGNOSIS — F17210 Nicotine dependence, cigarettes, uncomplicated: Secondary | ICD-10-CM | POA: Insufficient documentation

## 2020-07-17 DIAGNOSIS — C7A1 Malignant poorly differentiated neuroendocrine tumors: Secondary | ICD-10-CM | POA: Insufficient documentation

## 2020-07-17 DIAGNOSIS — R000 Tachycardia, unspecified: Secondary | ICD-10-CM | POA: Diagnosis present

## 2020-07-17 DIAGNOSIS — C7B8 Other secondary neuroendocrine tumors: Secondary | ICD-10-CM | POA: Diagnosis present

## 2020-07-17 DIAGNOSIS — R569 Unspecified convulsions: Secondary | ICD-10-CM

## 2020-07-17 DIAGNOSIS — I10 Essential (primary) hypertension: Secondary | ICD-10-CM | POA: Insufficient documentation

## 2020-07-17 DIAGNOSIS — Z720 Tobacco use: Secondary | ICD-10-CM | POA: Diagnosis present

## 2020-07-17 DIAGNOSIS — C7A8 Other malignant neuroendocrine tumors: Secondary | ICD-10-CM | POA: Diagnosis present

## 2020-07-17 DIAGNOSIS — E0590 Thyrotoxicosis, unspecified without thyrotoxic crisis or storm: Secondary | ICD-10-CM | POA: Diagnosis present

## 2020-07-17 DIAGNOSIS — K529 Noninfective gastroenteritis and colitis, unspecified: Principal | ICD-10-CM | POA: Diagnosis present

## 2020-07-17 DIAGNOSIS — F1410 Cocaine abuse, uncomplicated: Secondary | ICD-10-CM | POA: Diagnosis present

## 2020-07-17 DIAGNOSIS — E039 Hypothyroidism, unspecified: Secondary | ICD-10-CM | POA: Insufficient documentation

## 2020-07-17 DIAGNOSIS — F1910 Other psychoactive substance abuse, uncomplicated: Secondary | ICD-10-CM | POA: Insufficient documentation

## 2020-07-17 HISTORY — DX: Malignant (primary) neoplasm, unspecified: C80.1

## 2020-07-17 LAB — TYPE AND SCREEN
ABO/RH(D): AB POS
Antibody Screen: NEGATIVE

## 2020-07-17 LAB — CBC WITH DIFFERENTIAL/PLATELET
Abs Immature Granulocytes: 0.04 10*3/uL (ref 0.00–0.07)
Basophils Absolute: 0.1 10*3/uL (ref 0.0–0.1)
Basophils Relative: 1 %
Eosinophils Absolute: 0.7 10*3/uL — ABNORMAL HIGH (ref 0.0–0.5)
Eosinophils Relative: 6 %
HCT: 45.8 % (ref 36.0–46.0)
Hemoglobin: 13.6 g/dL (ref 12.0–15.0)
Immature Granulocytes: 0 %
Lymphocytes Relative: 19 %
Lymphs Abs: 1.9 10*3/uL (ref 0.7–4.0)
MCH: 25.8 pg — ABNORMAL LOW (ref 26.0–34.0)
MCHC: 29.7 g/dL — ABNORMAL LOW (ref 30.0–36.0)
MCV: 86.9 fL (ref 80.0–100.0)
Monocytes Absolute: 0.7 10*3/uL (ref 0.1–1.0)
Monocytes Relative: 7 %
Neutro Abs: 6.7 10*3/uL (ref 1.7–7.7)
Neutrophils Relative %: 67 %
Platelets: 166 10*3/uL (ref 150–400)
RBC: 5.27 MIL/uL — ABNORMAL HIGH (ref 3.87–5.11)
RDW: 13.9 % (ref 11.5–15.5)
WBC: 10.1 10*3/uL (ref 4.0–10.5)
nRBC: 0 % (ref 0.0–0.2)

## 2020-07-17 LAB — COMPREHENSIVE METABOLIC PANEL
ALT: 25 U/L (ref 0–44)
AST: 37 U/L (ref 15–41)
Albumin: 3 g/dL — ABNORMAL LOW (ref 3.5–5.0)
Alkaline Phosphatase: 124 U/L (ref 38–126)
Anion gap: 12 (ref 5–15)
BUN: 7 mg/dL (ref 6–20)
CO2: 20 mmol/L — ABNORMAL LOW (ref 22–32)
Calcium: 9 mg/dL (ref 8.9–10.3)
Chloride: 104 mmol/L (ref 98–111)
Creatinine, Ser: 0.6 mg/dL (ref 0.44–1.00)
GFR, Estimated: >60 mL/min (ref >60)
Glucose, Bld: 287 mg/dL — ABNORMAL HIGH (ref 70–99)
Potassium: 3.7 mmol/L (ref 3.5–5.1)
Sodium: 136 mmol/L (ref 135–145)
Total Bilirubin: 0.5 mg/dL (ref 0.3–1.2)
Total Protein: 7.1 g/dL (ref 6.5–8.1)

## 2020-07-17 LAB — I-STAT BETA HCG BLOOD, ED (MC, WL, AP ONLY): I-stat hCG, quantitative: 11.9 m[IU]/mL — ABNORMAL HIGH (ref <5)

## 2020-07-17 LAB — POC OCCULT BLOOD, ED: Fecal Occult Bld: POSITIVE — AB

## 2020-07-17 LAB — LIPASE, BLOOD: Lipase: 29 U/L (ref 11–51)

## 2020-07-17 MED ORDER — OXYCODONE-ACETAMINOPHEN 5-325 MG PO TABS: 1.0000 | ORAL_TABLET | Freq: Three times a day (TID) | ORAL | Status: DC | PRN

## 2020-07-17 MED ORDER — IOHEXOL 300 MG/ML  SOLN
100.0000 mL | Freq: Once | INTRAMUSCULAR | Status: AC | PRN
  Administered 2020-07-17: 100 mL via INTRAVENOUS

## 2020-07-17 MED ORDER — ONDANSETRON HCL 4 MG/2ML IJ SOLN
4.0000 mg | Freq: Once | INTRAMUSCULAR | Status: AC
  Administered 2020-07-17: 4 mg via INTRAVENOUS
  Filled 2020-07-17: qty 2

## 2020-07-17 MED ORDER — DEXAMETHASONE SODIUM PHOSPHATE 10 MG/ML IJ SOLN
10.0000 mg | Freq: Once | INTRAMUSCULAR | Status: AC
  Administered 2020-07-17: 10 mg via INTRAVENOUS
  Filled 2020-07-17: qty 1

## 2020-07-17 MED ORDER — SODIUM CHLORIDE 0.9 % IV SOLN
1000.0000 mL | INTRAVENOUS | Status: DC
  Administered 2020-07-17: 1000 mL via INTRAVENOUS

## 2020-07-17 MED ORDER — HYDROMORPHONE HCL 1 MG/ML IJ SOLN
1.0000 mg | Freq: Once | INTRAMUSCULAR | Status: AC
  Administered 2020-07-17: 1 mg via INTRAVENOUS
  Filled 2020-07-17: qty 1

## 2020-07-17 MED ORDER — LEVETIRACETAM 500 MG PO TABS
500.0000 mg | ORAL_TABLET | Freq: Two times a day (BID) | ORAL | Status: DC
  Administered 2020-07-18 (×2): 500 mg via ORAL
  Filled 2020-07-17 (×2): qty 1

## 2020-07-17 MED ORDER — ENOXAPARIN SODIUM 40 MG/0.4ML ~~LOC~~ SOLN
40.0000 mg | Freq: Every day | SUBCUTANEOUS | Status: DC
  Administered 2020-07-18: 40 mg via SUBCUTANEOUS

## 2020-07-17 MED ORDER — MESALAMINE 1.2 G PO TBEC
2.4000 g | DELAYED_RELEASE_TABLET | Freq: Every day | ORAL | Status: DC
  Administered 2020-07-18: 2.4 g via ORAL
  Filled 2020-07-17 (×2): qty 2

## 2020-07-17 MED ORDER — SODIUM CHLORIDE 0.9 % IV SOLN: INTRAVENOUS | Status: DC

## 2020-07-17 MED ORDER — SODIUM CHLORIDE 0.9 % IV BOLUS (SEPSIS)
1000.0000 mL | Freq: Once | INTRAVENOUS | Status: AC
  Administered 2020-07-17: 1000 mL via INTRAVENOUS

## 2020-07-17 NOTE — H&P (Signed)
History and Physical   Holly Weber CBJ:[id number] DOB: June 10, 1966 DOA: 07/17/2020  PCP: Patient, No Pcp Per  Patient coming from: Home  I have personally briefly reviewed patient's old medical records in [place].  Chief Concern: Diarrhea  HPI: Holly Weber is a 54 y.o. female with medical history significant for hypothyroidism, CVA, hypertension, polysubstance abuse, hepatitis C, ulcerative colitis, seizures, recent incarceration and is on medical leave starting 07/11/20 for medical leave, presents emergency department on 07/17/2020 for chief concerns of diarrhea.  She states the diarrhea has been ongoing and she presented today because it is associated with abdominal pain.  She reports that she has not seen her gastroenterologist for approximately 6 years.  She denies taking medications for ulcerative colitis.  She reports that the diarrhea is persistent and chronic.  She states it is watery with some pieces of solids.  She also endorses nausea and vomiting and difficulty maintaining p.o. intake.  She states that she has not taken her ulcerative colitis medications in many years.  She also denies not taking her seizure medications since discharge from hospital.  She endorses redness from her stool prompting her to present to the emergency department for evaluation.  She was concerned of GI bleed.  On further questioning the blood in her stool is with wiping and not bright red blood per rectum.  Review of systems positive for nausea, vomiting, abdominal pain, diarrhea, generalized weakness, loss of appetite.  Review of systems negative for headache, vision changes, seizures, dysphagia, odynophagia, chest pain, dysuria, hematuria, suicidal ideation, homicidal ideation.  Social history: She lives at home with her father who is in his 52s.  She endorses crack cocaine use via smoking.  She endorses tobacco use daily.  She endorses alcohol use.  She denies injection drug use.  Family  history: No family history of ulcerative colitis  ED Course: Discussed with ED provider.  ED provider is concerned that patient does not have good follow-up and therefore would like admission to manage her colitis.  CT of the abdomen and pelvis with contrast in the emergency department showed diffuse mild colitis involving the descending colon sigmoid colon and rectum, consistent with patient's known inflammatory colitis.  No loculated fluid collections or free air.  Review of Systems: As per HPI otherwise 10 point review of systems negative.   Past Medical History:  Diagnosis Date  . Cancer ([place])   . Colitis, ulcerative ([place])   . Depression   . Hepatitis C   . Hypertension   . Stroke ([place])   . Thyroid disease    Past Surgical History:  Procedure Laterality Date  . ARM WOUND REPAIR / CLOSURE    . COLONOSCOPY    . OPEN REDUCTION INTERNAL FIXATION (ORIF) DISTAL RADIAL FRACTURE Left 01/07/2018   Procedure: OPEN REDUCTION INTERNAL FIXATION (ORIF) DISTAL RADIAL FRACTURE;  Surgeon: Leanora Cover, MD;  Location: [facility];  Service: Orthopedics;  Laterality: Left;  . TEE WITHOUT CARDIOVERSION N/A 12/17/2016   Procedure: TRANSESOPHAGEAL ECHOCARDIOGRAM (TEE);  Surgeon: Josue Hector, MD;  Location: [facility] ENDOSCOPY;  Service: Cardiovascular;  Laterality: N/A;  . TONSILLECTOMY     Social History:  reports that she has been smoking cigarettes. She has been smoking about 1.50 packs per day. She has never used smokeless tobacco. She reports current alcohol use. She reports current drug use. Drugs: Cocaine and Marijuana.  Allergies  Allergen Reactions  . Asa [Aspirin] Other (See Comments)    Ulcerative colitis flares.  Marland Kitchen  Nsaids Other (See Comments)    Ulcerative colitis flares  . Penicillins Rash and Other (See Comments)    Has patient had a PCN reaction causing immediate rash, facial/tongue/throat swelling, SOB or lightheadedness with hypotension:unsure Has patient had a PCN  reaction causing severe rash involving mucus membranes or skin necrosis:unsure Has patient had a PCN reaction that required hospitalization:No Has patient had a PCN reaction occurring within the last 10 years:Yes If all of the above answers are "NO", then may proceed with Cephalosporin use.    Family History  Problem Relation Age of Onset  . Hypertension Mother   . Hypertension Father    Family history: Family history reviewed and negative for ulcerative colitis.  Prior to Admission medications   Medication Sig Start Date End Date Taking? Authorizing Provider  levETIRAcetam (KEPPRA) 500 MG tablet Take 1 tablet (500 mg total) by mouth 2 (two) times daily. 07/14/20 10/12/20  Terrilee Croak, MD  oxyCODONE-acetaminophen (PERCOCET) 5-325 MG tablet Take 1 tablet by mouth every 8 (eight) hours as needed for up to 5 days for severe pain. 07/14/20 07/19/20  Terrilee Croak, MD   Physical Exam: Vitals:   07/17/20 1511 07/17/20 1550 07/17/20 1600 07/17/20 2234  BP: (!) 180/115 (!) 154/87  (!) 144/83  Pulse: (!) 130  (!) 115 (!) 122  Resp: 20 (!) 27 (!) 28 15  Temp: 97.9 F (36.6 C)     TempSrc: Oral     SpO2: 92%  95% 94%  Weight:      Height:       Constitutional: Appears older than chronological age, NAD Eyes: PERRL, lids and conjunctivae normal ENMT: Mucous membranes are moist. Posterior pharynx clear of any exudate or lesions. Normal dentition.  Neck: normal, supple, no masses, no thyromegaly Respiratory: clear to auscultation bilaterally, no wheezing, no crackles. Normal respiratory effort. No accessory muscle use.  Cardiovascular: Regular rate and rhythm, no murmurs / rubs / gallops. No extremity edema. 2+ pedal pulses. No carotid bruits.  Abdomen: Diffuse tenderness with deep palpation, no masses palpated. No hepatosplenomegaly.  Hyperactive bowel sounds.  Musculoskeletal: tunneled port-a-cath in the right upper anterior chest wall, no clubbing / cyanosis. No joint deformity upper and  lower extremities. Good ROM, no contractures. Normal muscle tone.  Skin: no rashes, lesions, ulcers. No induration Neurologic: CN 2-12 grossly intact. Sensation intact. Strength 5/5 in all 4.  Psychiatric: Normal judgment and insight. Alert and oriented x 3. Normal mood.   Labs on Admission: I have personally reviewed following labs and imaging studies  CBC: Recent Labs  Lab 07/13/20 0534 07/13/20 0605 07/17/20 1529  WBC 13.4*  --  10.1  NEUTROABS 7.9*  --  6.7  HGB 15.5* 16.7* 13.6  HCT 48.5* 49.0* 45.8  MCV 81.6  --  86.9  PLT 257  --  601   Basic Metabolic Panel: Recent Labs  Lab 07/13/20 0605 07/13/20 0630 07/17/20 1529  NA 137 136 136  K 4.7 4.1 3.7  CL 106 106 104  CO2  --  23 20*  GLUCOSE 229* 195* 287*  BUN 8 10 7   CREATININE 0.40* 0.54 0.60  CALCIUM  --  8.9 9.0   GFR: Estimated Creatinine Clearance: 83.5 mL/min (by C-G formula based on SCr of 0.6 mg/dL).  Liver Function Tests: Recent Labs  Lab 07/13/20 0630 07/17/20 1529  AST 31 37  ALT 27 25  ALKPHOS 127* 124  BILITOT 1.9* 0.5  PROT 7.4 7.1  ALBUMIN 3.3* 3.0*   Recent Labs  Lab 07/17/20 1529  LIPASE 29   CBG: Recent Labs  Lab 07/13/20 2117 07/14/20 0812 07/14/20 1203  GLUCAP 120* 114* 187*   Urine analysis:    Component Value Date/Time   COLORURINE YELLOW 07/13/2020 0620   APPEARANCEUR CLEAR 07/13/2020 0620   LABSPEC 1.016 07/13/2020 0620   PHURINE 5.0 07/13/2020 0620   GLUCOSEU NEGATIVE 07/13/2020 0620   HGBUR NEGATIVE 07/13/2020 0620   BILIRUBINUR NEGATIVE 07/13/2020 0620   KETONESUR 5 (A) 07/13/2020 0620   PROTEINUR 30 (A) 07/13/2020 0620   NITRITE NEGATIVE 07/13/2020 0620   LEUKOCYTESUR NEGATIVE 07/13/2020 3086   Radiological Exams on Admission: Personally reviewed and I agree with radiologist reading as below.  CT ABDOMEN PELVIS W CONTRAST  Result Date: 07/17/2020 CLINICAL DATA:  Abdominal pain history of ulcerative colitis EXAM: CT ABDOMEN AND PELVIS WITH CONTRAST  TECHNIQUE: Multidetector CT imaging of the abdomen and pelvis was performed using the standard protocol following bolus administration of intravenous contrast. CONTRAST:  110m OMNIPAQUE IOHEXOL 300 MG/ML  SOLN COMPARISON:  None. FINDINGS: Lower chest: The visualized heart size within normal limits. No pericardial fluid/thickening. No hiatal hernia. The visualized portions of the lungs are clear. Hepatobiliary: The liver is normal in density without focal abnormality.The main portal vein is patent. No evidence of calcified gallstones, gallbladder wall thickening or biliary dilatation. Pancreas: Unremarkable. No pancreatic ductal dilatation or surrounding inflammatory changes. Spleen: Normal in size without focal abnormality. Adrenals/Urinary Tract: Both adrenal glands appear normal. The kidneys and collecting system appear normal without evidence of urinary tract calculus or hydronephrosis. Bladder is unremarkable. Stomach/Bowel: The stomach, small bowel are normal in appearance. There appears to be diffuse wall thickening with hyperenhancement of the mucosa involving the descending colon sigmoid colon and rectum. There is mild mesenteric edema seen within this area. No loculated fluid collections or free air is seen. Vascular/Lymphatic: There are no enlarged mesenteric, retroperitoneal, or pelvic lymph nodes. Scattered aortic atherosclerotic calcifications are seen without aneurysmal dilatation. Reproductive: The uterus and adnexa are unremarkable. Other: No evidence of abdominal wall mass or hernia. Musculoskeletal: No acute or significant osseous findings. IMPRESSION: Findings suggestive of diffuse mild colitis involving the descending colon sigmoid colon and rectum, consistent with the patient's known inflammatory colitis. No loculated fluid collections or free air. Aortic Atherosclerosis (ICD10-I70.0). Electronically Signed   By: BPrudencio PairM.D.   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 provider to call GI consult if indicated versus outpatient referral -Extensive counseling given to tobacco cessation as this can worsen ulcerative colitis symptoms  Abdominal pain secondary to colitis-Percocet 5 to 8 hours for moderate pain, fentanyl 12.5 MCG IV every 4 hours as needed for severe pain  h/o seizure suspect secondary to crack cocaine use after discharge from prison- she was started on keprra 500 mg BID, however on discharge she has not been taking her seizure medications - Resume keppra 500 mg BID   Crack cocaine use-patient reports she last used approximately prior to  last hospitalization -Extensive counseling given for cessation  Neuroendocrine carcinoma with metastatic disease, diagnosed at [facility] in July 2021 -History of mets to brain (4 mm focus in the right occipital region, 3 mm focus in the subcortical U fibers of the right posterior temporal lobe no mass-effect per MRI of the brain on 07/13/2020), lungs, -She received 1 dose of carboplatin/etoposide on 04/14/20, followed by 10 days of filgrastim She was transferred back to present neurology, it is not clear if she received further oncology/radiation oncology therapy at that time -She on her discharge from prison, she was given 2 weeks left to live and is now in early parole -She has oncology appointment for another chemotherapy session in November 2021  History of hypothyroid, noncompliant  with medication previously on methimazole -Previously she was on methimazole 20 mg 4 times daily, potassium iodide to 50 mg 4 times daily, as well as propranolol to 60 mg 3 times daily, -TSH on 07/14/2020 was less than 0.01, free T4 was 3.39 -We will start with methimazole 20 mg daily titrate as needed -Propanolol 20 mg twice daily  Sinus tachycardia suspect secondary to noncompliance with thyroid medications-on telemetry  Nausea vomiting once prior to ED presentation-Zofran 4 mg IV every 8 hours as needed for nausea and vomiting DVT prophylaxis: Enoxaparin Code Status: Full code Diet: Regular Family Communication: Patient requests a.m. family notification Disposition Plan: Pending clinical course Consults called: No urgent consultation at this time Admission status: Observation with telemetry  Dashawna Delbridge N Aarnav Steagall D.O. [facility]  If 7AM-7PM, please contact day-coverage provider [email or web address]  07/17/2020, 11:33 PM

## 2020-07-17 NOTE — ED Provider Notes (Signed)
[facility] EMERGENCY DEPARTMENT Provider Note   CSN: [id number] Arrival date & time: 07/17/20  1505     History Chief Complaint  Patient presents with  . Rectal Bleeding    Holly Weber is a 54 y.o. female.  HPI   Pt states she has history of uc and cancer.  She only has a few weeks to live.  Pt states she started having rectal bleeding about 4 days ago.  SHe also started having diarrhea, too many times to count.  She is having diffuse abdominal pain.  She feels like she is losing too much blood.  Her appetite has been poor.  Patient states the symptoms have been worsening.  Pt states she is going for chemo in November.  She has received one treatment so far at another hospital other. Patient was recently admitted to the hospital on October 22 and discharged on October 23.  At that time she was admitted for seizures felt to be related to possible underlying seizure disorder precipitated by cocaine use. Past Medical History:  Diagnosis Date  . Cancer ([place])   . Colitis, ulcerative ([place])   . Depression   . Hepatitis C   . Hypertension   . Stroke ([place])   . Thyroid disease     Patient Active Problem List   Diagnosis Date Noted  . Palliative care encounter   . Seizure ([place]) 07/13/2020  . Hyperthyroidism 07/13/2020  . 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 Crack cocaine overdose ([place])   . Cocaine overdose ([place]) 10/18/2018  . Cellulitis 10/18/2018  . Hypokalemia 10/18/2018  . MSSA (methicillin susceptible Staphylococcus aureus) infection 12/27/2016  . Bacteremia 12/22/2016  . Polysubstance abuse ([place]) 12/22/2016  . Osteomyelitis ([place]) 12/22/2016  . Hyponatremia 12/22/2016  . Osteomyelitis of left hip ([place])   . Sacroiliitis ([place])   . Staphylococcus aureus bacteremia 12/15/2016  . Pneumonia 12/15/2016  . Hyperglycemia 12/15/2016  . Normocytic anemia 12/15/2016  . IV drug user 12/15/2016  . Acute left-sided low back  pain with left-sided sciatica 12/13/2016  . Cocaine abuse ([place]) 12/13/2016  . Tobacco abuse 12/13/2016  . HTN (hypertension) 12/13/2016    Past Surgical History:  Procedure Laterality Date  . ARM WOUND REPAIR / CLOSURE    . COLONOSCOPY    . OPEN REDUCTION INTERNAL FIXATION (ORIF) DISTAL RADIAL FRACTURE Left 01/07/2018   Procedure: OPEN REDUCTION INTERNAL FIXATION (ORIF) DISTAL RADIAL FRACTURE;  Surgeon: Leanora Cover, MD;  Location: [facility];  Service: Orthopedics;  Laterality: Left;  . TEE WITHOUT CARDIOVERSION N/A 12/17/2016   Procedure: TRANSESOPHAGEAL ECHOCARDIOGRAM (TEE);  Surgeon: Josue Hector, MD;  Location: [facility] ENDOSCOPY;  Service: Cardiovascular;  Laterality: N/A;  . TONSILLECTOMY       OB History   No obstetric history on file.     Family History  Problem Relation Age of Onset  . Hypertension Mother   . Hypertension Father     Social History   Tobacco Use  . Smoking status: Current Every Day Smoker    Packs/day: 1.50    Types: Cigarettes  . Smokeless tobacco: Never Used  Substance Use Topics  . Alcohol use: Yes    Comment: Daily   . Drug use: Yes    Types: Cocaine, Marijuana    Comment: Daily on both    Home Medications Prior to Admission medications   Medication Sig Start Date End Date Taking? Authorizing Provider  levETIRAcetam (KEPPRA) 500 MG tablet Take 1 tablet (  500 mg total) by mouth 2 (two) times daily. 07/14/20 10/12/20  Terrilee Croak, MD  oxyCODONE-acetaminophen (PERCOCET) 5-325 MG tablet Take 1 tablet by mouth every 8 (eight) hours as needed for up to 5 days for severe pain. 07/14/20 07/19/20  Terrilee Croak, MD    Allergies    Asa [aspirin], Nsaids, and Penicillins  Review of Systems   Review of Systems  Physical Exam Updated Vital Signs BP (!) 144/83 (BP Location: Left Arm)   Pulse (!) 122   Temp 97.9 F (36.6 C) (Oral)   Resp 15   Ht 1.651 m (5' 5" )   Wt 77.1 kg   SpO2 94%   BMI 28.29 kg/m   Physical Exam Vitals  and nursing note reviewed.  Constitutional:      General: She is not in acute distress.    Appearance: She is well-developed.  HENT:     Head: Normocephalic and atraumatic.     Right Ear: External ear normal.     Left Ear: External ear normal.  Eyes:     General: No scleral icterus.       Right eye: No discharge.        Left eye: No discharge.     Conjunctiva/sclera: Conjunctivae normal.  Neck:     Trachea: No tracheal deviation.  Cardiovascular:     Rate and Rhythm: Regular rhythm. Tachycardia present.  Pulmonary:     Effort: Pulmonary effort is normal. No respiratory distress.     Breath sounds: Normal breath sounds. No stridor. No wheezing or rales.  Abdominal:     General: Bowel sounds are normal. There is no distension.     Palpations: Abdomen is soft.     Tenderness: There is abdominal tenderness. There is no guarding or rebound.  Genitourinary:    Comments: No gross blood noted on rectal exam Musculoskeletal:        General: No tenderness.     Cervical back: Neck supple.  Skin:    General: Skin is warm and dry.     Findings: No rash.  Neurological:     Mental Status: She is alert.     Cranial Nerves: No cranial nerve deficit (no facial droop, extraocular movements intact, no slurred speech).     Sensory: No sensory deficit.     Motor: No abnormal muscle tone or seizure activity.     Coordination: Coordination normal.     ED Results / Procedures / Treatments   Labs (all labs ordered are listed, but only abnormal results are displayed) Labs Reviewed  COMPREHENSIVE METABOLIC PANEL - Abnormal; Notable for the following components:      Result Value   CO2 20 (*)    Glucose, Bld 287 (*)    Albumin 3.0 (*)    All other components within normal limits  CBC WITH DIFFERENTIAL/PLATELET - Abnormal; Notable for the following components:   RBC 5.27 (*)    MCH 25.8 (*)    MCHC 29.7 (*)    Eosinophils Absolute 0.7 (*)    All other components within normal limits    I-STAT BETA HCG BLOOD, ED (MC, [facility], AP ONLY) - Abnormal; Notable for the following components:   I-stat hCG, quantitative 11.9 (*)    All other components within normal limits  POC OCCULT BLOOD, ED - Abnormal; Notable for the following components:   Fecal Occult Bld POSITIVE (*)    All other components within normal limits  LIPASE, BLOOD  URINALYSIS, ROUTINE W REFLEX MICROSCOPIC  RAPID URINE DRUG SCREEN, HOSP PERFORMED  TYPE AND SCREEN  ABO/RH    EKG None  Radiology CT ABDOMEN PELVIS W CONTRAST  Result Date: 07/17/2020 CLINICAL DATA:  Abdominal pain history of ulcerative colitis EXAM: CT ABDOMEN AND PELVIS WITH CONTRAST TECHNIQUE: Multidetector CT imaging of the abdomen and pelvis was performed using the standard protocol following bolus administration of intravenous contrast. CONTRAST:  179m OMNIPAQUE IOHEXOL 300 MG/ML  SOLN COMPARISON:  None. FINDINGS: Lower chest: The visualized heart size within normal limits. No pericardial fluid/thickening. No hiatal hernia. The visualized portions of the lungs are clear. Hepatobiliary: The liver is normal in density without focal abnormality.The main portal vein is patent. No evidence of calcified gallstones, gallbladder wall thickening or biliary dilatation. Pancreas: Unremarkable. No pancreatic ductal dilatation or surrounding inflammatory changes. Spleen: Normal in size without focal abnormality. Adrenals/Urinary Tract: Both adrenal glands appear normal. The kidneys and collecting system appear normal without evidence of urinary tract calculus or hydronephrosis. Bladder is unremarkable. Stomach/Bowel: The stomach, small bowel are normal in appearance. There appears to be diffuse wall thickening with hyperenhancement of the mucosa involving the descending colon sigmoid colon and rectum. There is mild mesenteric edema seen within this area. No loculated fluid collections or free air is seen. Vascular/Lymphatic: There are no enlarged mesenteric,  retroperitoneal, or pelvic lymph nodes. Scattered aortic atherosclerotic calcifications are seen without aneurysmal dilatation. Reproductive: The uterus and adnexa are unremarkable. Other: No evidence of abdominal wall mass or hernia. Musculoskeletal: No acute or significant osseous findings. IMPRESSION: Findings suggestive of diffuse mild colitis involving the descending colon sigmoid colon and rectum, consistent with the patient's known inflammatory colitis. No loculated fluid collections or free air. Aortic Atherosclerosis (ICD10-I70.0). Electronically Signed   By: BPrudencio PairM.D.   On: 07/17/2020 19:51    Procedures .Critical Care Performed by: KDorie Rank MD Authorized by: KDorie Rank MD   Critical care provider statement:    Critical care time (minutes):  35   Critical care was time spent personally by me on the following activities:  Discussions with consultants, evaluation of patient's response to treatment, examination of patient, ordering and performing treatments and interventions, ordering and review of laboratory studies, ordering and review of radiographic studies, pulse oximetry, re-evaluation of patient's condition, obtaining history from patient or surrogate and review of old charts   (including critical care time)  Medications Ordered in ED Medications  sodium chloride 0.9 % bolus 1,000 mL (0 mLs Intravenous Stopped 07/17/20 1718)    Followed by  0.9 %  sodium chloride infusion (1,000 mLs Intravenous New Bag/Given 07/17/20 2207)  HYDROmorphone (DILAUDID) injection 1 mg (has no administration in time range)  HYDROmorphone (DILAUDID) injection 1 mg (1 mg Intravenous Given 07/17/20 1609)  ondansetron (ZOFRAN) injection 4 mg (4 mg Intravenous Given 07/17/20 1609)  iohexol (OMNIPAQUE) 300 MG/ML solution 100 mL (100 mLs Intravenous Contrast Given 07/17/20 1926)  dexamethasone (DECADRON) injection 10 mg (10 mg Intravenous Given 07/17/20 2204)    ED Course  I have reviewed the  triage vital signs and the nursing notes.  Pertinent labs & imaging results that were available during my care of the patient were reviewed by me and considered in my medical decision making (see chart for details).  Clinical Course as of Jul 17 2248  Tue Jul 17, 2020  1536 Vital signs reviewed.  Patient noted to be tachycardic and hypertensive   [JK]  1704 CBC shows normal white blood cell count.  Hemoglobin is normal   [JK]  1704 Fecal occult is positive   [JK]  1705 Patient is having diffuse abdominal pain.  She does have Hemoccult positive stools but fortunately is not anemic.  With her diffuse pain and complicated medical history will CT to evaluate further   [JK]  1729 Discussed findings with patient and family member.  We will proceed with CT scan   [JK]  2126 Patient's x-rays do show findings suggestive of mild diffuse colitis   [JK]    Clinical Course User Index [JK] Dorie Rank, MD   MDM Rules/Calculators/A&P                          Patient presented to ED with complaints of abdominal pain and rectal bleeding associated with history of ulcerative colitis.  Patient also has history of metastatic cancer.  Patient presented with increasing abdominal pain and weakness.  She was noted to be tachycardic.  Patient did have guaiac positive stools but is fortunately not anemic.  No large volume of rectal bleeding noted during the ED stay.  CT scan does show findings consistent with colitis.  Have started the patient on dose of steroids.  Patient does not have any GI follow-up and considering her comorbidities I think is reasonable to bring him to the hospital for IV steroids and further treatment.  Case discussed with Dr. Tobie Poet Final Clinical Impression(s) / ED Diagnoses Final diagnoses:  IBD (inflammatory bowel disease)  Colitis      Dorie Rank, MD 07/17/20 2250

## 2020-07-17 NOTE — ED Triage Notes (Signed)
Pt reports rectal bleeding, hx of ulcerative colitis, hx of terminal cancer, states she only has a few weeks left to live. Pt c.o abd pain and weakness.

## 2020-07-18 ENCOUNTER — Other Ambulatory Visit (HOSPITAL_COMMUNITY): Payer: Self-pay | Admitting: Internal Medicine

## 2020-07-18 DIAGNOSIS — K529 Noninfective gastroenteritis and colitis, unspecified: Secondary | ICD-10-CM

## 2020-07-18 DIAGNOSIS — R000 Tachycardia, unspecified: Secondary | ICD-10-CM | POA: Diagnosis present

## 2020-07-18 LAB — CBC
HCT: 43.2 % (ref 36.0–46.0)
HCT: 43.5 % (ref 36.0–46.0)
Hemoglobin: 13.5 g/dL (ref 12.0–15.0)
Hemoglobin: 13.6 g/dL (ref 12.0–15.0)
MCH: 25.9 pg — ABNORMAL LOW (ref 26.0–34.0)
MCH: 26.4 pg (ref 26.0–34.0)
MCHC: 31.3 g/dL (ref 30.0–36.0)
MCHC: 31.3 g/dL (ref 30.0–36.0)
MCV: 82.9 fL (ref 80.0–100.0)
MCV: 84.4 fL (ref 80.0–100.0)
Platelets: 177 10*3/uL (ref 150–400)
Platelets: 201 10*3/uL (ref 150–400)
RBC: 5.12 MIL/uL — ABNORMAL HIGH (ref 3.87–5.11)
RBC: 5.25 MIL/uL — ABNORMAL HIGH (ref 3.87–5.11)
RDW: 13.8 % (ref 11.5–15.5)
RDW: 13.9 % (ref 11.5–15.5)
WBC: 11.2 10*3/uL — ABNORMAL HIGH (ref 4.0–10.5)
WBC: 13.6 10*3/uL — ABNORMAL HIGH (ref 4.0–10.5)
nRBC: 0 % (ref 0.0–0.2)
nRBC: 0 % (ref 0.0–0.2)

## 2020-07-18 LAB — CULTURE, BLOOD (ROUTINE X 2)
Culture: NO GROWTH
Culture: NO GROWTH
Special Requests: ADEQUATE

## 2020-07-18 LAB — BASIC METABOLIC PANEL
Anion gap: 9 (ref 5–15)
BUN: 5 mg/dL — ABNORMAL LOW (ref 6–20)
CO2: 22 mmol/L (ref 22–32)
Calcium: 8.9 mg/dL (ref 8.9–10.3)
Chloride: 105 mmol/L (ref 98–111)
Creatinine, Ser: 0.51 mg/dL (ref 0.44–1.00)
GFR, Estimated: >60 mL/min (ref >60)
Glucose, Bld: 258 mg/dL — ABNORMAL HIGH (ref 70–99)
Potassium: 3.8 mmol/L (ref 3.5–5.1)
Sodium: 136 mmol/L (ref 135–145)

## 2020-07-18 LAB — CREATININE, SERUM
Creatinine, Ser: 0.5 mg/dL (ref 0.44–1.00)
GFR, Estimated: >60 mL/min (ref >60)

## 2020-07-18 MED ORDER — ONDANSETRON HCL 4 MG/2ML IJ SOLN: 4.0000 mg | Freq: Three times a day (TID) | INTRAMUSCULAR | Status: DC | PRN

## 2020-07-18 MED ORDER — OXYCODONE-ACETAMINOPHEN 5-325 MG PO TABS: 1.0000 | ORAL_TABLET | Freq: Three times a day (TID) | ORAL | 0 refills | Status: AC | PRN

## 2020-07-18 MED ORDER — LEVETIRACETAM 500 MG PO TABS: 500.0000 mg | ORAL_TABLET | Freq: Two times a day (BID) | ORAL | 2 refills | Status: DC

## 2020-07-18 MED ORDER — PROPRANOLOL HCL 20 MG PO TABS
20.0000 mg | ORAL_TABLET | Freq: Two times a day (BID) | ORAL | Status: DC
  Administered 2020-07-18: 20 mg via ORAL
  Filled 2020-07-18 (×4): qty 1

## 2020-07-18 MED ORDER — METHIMAZOLE 10 MG PO TABS
20.0000 mg | ORAL_TABLET | Freq: Every day | ORAL | Status: DC
  Filled 2020-07-18 (×2): qty 2

## 2020-07-18 MED ORDER — PROCHLORPERAZINE MALEATE 10 MG PO TABS: 10.0000 mg | ORAL_TABLET | ORAL | 0 refills | Status: DC | PRN

## 2020-07-18 MED ORDER — PROPRANOLOL HCL 20 MG PO TABS: 20.0000 mg | ORAL_TABLET | Freq: Two times a day (BID) | ORAL | 0 refills | Status: DC

## 2020-07-18 MED ORDER — MESALAMINE 1.2 G PO TBEC: 2.4000 g | DELAYED_RELEASE_TABLET | Freq: Every day | ORAL | 0 refills | Status: DC

## 2020-07-18 MED ORDER — METHIMAZOLE 10 MG PO TABS: 20.0000 mg | ORAL_TABLET | Freq: Every day | ORAL | 0 refills | Status: DC

## 2020-07-18 MED ORDER — MEGESTROL ACETATE 40 MG PO TABS: 40.0000 mg | ORAL_TABLET | Freq: Two times a day (BID) | ORAL | 0 refills | Status: DC

## 2020-07-18 MED ORDER — MESALAMINE 1.2 G PO TBEC: 2.4000 g | DELAYED_RELEASE_TABLET | Freq: Every day | ORAL | 0 refills | Status: AC

## 2020-07-18 MED ORDER — LORAZEPAM 0.5 MG PO TABS: 0.5000 mg | ORAL_TABLET | ORAL | 0 refills | Status: DC | PRN

## 2020-07-18 MED ORDER — OXYCODONE-ACETAMINOPHEN 5-325 MG PO TABS
1.0000 | ORAL_TABLET | Freq: Three times a day (TID) | ORAL | Status: DC | PRN
  Administered 2020-07-18: 1 via ORAL
  Filled 2020-07-18: qty 1

## 2020-07-18 MED ORDER — FENTANYL CITRATE (PF) 100 MCG/2ML IJ SOLN: 12.5000 ug | INTRAMUSCULAR | Status: DC | PRN

## 2020-07-18 MED FILL — PROPRANOLOL 20 MG TABLET: 20 | 30 days supply | Qty: 60 | Fill #0

## 2020-07-18 MED FILL — methIMAzole 10 MG TABS: 10 | 30 days supply | Qty: 60 | Fill #0

## 2020-07-18 MED FILL — PROCHLORPERAZINE 10 MG TAB: 10 | 7 days supply | Qty: 42 | Fill #0

## 2020-07-18 MED FILL — MEGESTROL 40 MG TABLET: 40 | 30 days supply | Qty: 60 | Fill #0

## 2020-07-18 MED FILL — levETIRAcetam 500 MG TABS: 500 | 30 days supply | Qty: 60 | Fill #0

## 2020-07-18 MED FILL — LORazepam 0.5 MG TABS: 0.5 | 7 days supply | Qty: 42 | Fill #0

## 2020-07-18 NOTE — Progress Notes (Addendum)
Inpatient Diabetes Program Recommendations  AACE/ADA: New Consensus Statement on Inpatient Glycemic Control (2015)  Target Ranges:  Prepandial:   less than 140 mg/dL      Peak postprandial:   less than 180 mg/dL (1-2 hours)      Critically ill patients:  140 - 180 mg/dL   Lab Results  Component Value Date   GLUCAP 187 (H) 07/14/2020   HGBA1C 7.0 (H) 07/14/2020    Review of Glycemic Control  Diabetes history: None Hyperglycemia in previous admissions   MD notes would not do tight control in light of per medical history and prognosis  Current orders for Inpatient glycemic control:  None  A1c 7%  Inpatient Diabetes Program Recommendations:    Glucose 200's. Decadron given yesterday.  - Consider CBGs and Novolog 0-15 units tid + hs while here  Spoke with pt about A1c level and to watch what she drinks more than what she eats due to poor appetite. Discussed with her and her son about other supplements that could be beneficial and not increase glucose levels too much. Do not necessarily want her to check glucose or be on meds at this point will try diet changes, however may need follow up if prognosis is longer.  Spoke with son over the phone he is inquiring about home health at home.  Pt concerned about affording seizure medications as her father pays for everything on a limited income.  Thanks,  Tama Headings RN, MSN, BC-ADM Inpatient Diabetes Coordinator Team Pager [phone] (8a-5p)

## 2020-07-18 NOTE — Progress Notes (Signed)
[facility])  Referral received for hospice services at home.  Met with the pt and her father at the bedside.   Pt is a poor historian and her father has to provide some of the information.  Pt speaks of meeting with an oncologist on 11/12. She is not sure where or with whom. She previously was treated at [facility].    Noted Dr. Hazeline Junker note.  Discussed with pt missed appointments and she denied that she had appointments. She states she will start chemo on 11/12.  Discussed that chemo and hospice do not coincide. She seemed confused by this, however her father nods in understanding.   During this visit, pt expresses understanding of hospice philosophy.  Requested DME, [facility] will order DME, does not need prior to discharging.   Discussed with [facility] manager who is arranging medications for her as she is medicaid pending. Family is thankful for this assistance.  [facility] will follow up with pt at home.  Venia Carbon RN, BSN, [facility] Liaison

## 2020-07-18 NOTE — Discharge Summary (Addendum)
Physician Discharge Summary  Holly Weber DOB: [birth year] DOA: 07/17/2020  PCP: Patient, No Pcp Per  Admit date: 07/17/2020 Discharge date: 07/18/2020  Admitted From: Home Disposition: Home with home hospice  Recommendations for Outpatient Follow-up:  1. Follow-up with [facility] 2. Restarted methimazole and propranolol for hyperthyroidism 3. Restarted mesalamine for ulcerative colitis 4. Started on Megace for appetite stimulation 5. Started on Ativan for anxiety 6. 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  Patient has not seen her gastroenterologist in approximately 6 years due to incarceration.  Has not been compliant with her home medications including for ulcerative colitis.   Hospital course:  Ulcerative colitis flare Patient presenting with progressive abdominal pain associated with her underlying chronic diarrhea.  History of ulcerative colitis, noncompliant with home medications.  CT abdomen/pelvis notable for diffuse mild colitis descending sigmoid colon/rectum consistent with inflammatory colitis.  Patient received IV Decadron and restarted on home mesalamine 2.4 g p.o. daily.  Patient was able to tolerate diet without any recurrence of abdominal pain or nausea.  Will discharge home on mesalamine 2.4  g p.o. daily.  Metastatic carcinoma with neuroendocrine features Patient initially diagnosed at [facility] in [facility].  Was seen by oncology, Dr. Alen Blew during previous hospitalization.  MRI was suspicious for intracranial metastases with at least 2 right hemispheric lesions up to 5 mm parietal lobe without mass-effect or edema.  Patient has received 1 cycle of carboplatin/epoposide on 04/14/2020 followed by 10 days of filgrastim; in which she was transferred back to prison and advised to follow-up with medical and radiation oncology, but has been lost to follow-up.  Given the extent of her metastatic disease, poor compliance, polysubstance abuse; subsequent chemotherapy would offer very little palliation at this point given the disease is incurable; and several physicians during previous hospitalization recommended outpatient palliative care/hospice on discharge.  Patient has oncology appointment for consideration of another chemotherapy session November 2021.  Patient discharged with home hospice.  Hyperthyroidism  Restarted methimazole 20 mg p.o. daily and propranolol 20 g twice daily  History of seizure disorder Continue Keppra 500 mg p.o. twice daily  Polysubstance abuse Longstanding history of drug abuse/dependence with cocaine/crack.  Also history of IV drug abuse.  Several incarcerations for such.  Counseled on need for complete cessation.    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   Nsaids  Other (See Comments)   Ulcerative colitis flares  Penicillins Rash, Other (See Comments)   Has patient had a PCN reaction causing immediate rash, facial/tongue/throat swelling, SOB or lightheadedness with hypotension:unsure Has patient had a PCN reaction causing severe rash involving mucus membranes or skin necrosis:unsure Has patient had a PCN reaction that required hospitalization:No Has patient had a PCN reaction occurring within the last 10 years:Yes If all of the above answers are "NO", then may proceed with Cephalosporin use.      Medication List    TAKE these medications   levETIRAcetam 500 MG tablet Commonly known as: Keppra Take 1 tablet (500 mg total) by mouth 2 (two) times daily.   LORazepam 0.5 MG tablet Commonly known as: ATIVAN Take 1 tablet (0.5 mg total) by mouth every 4 (four) hours as needed for anxiety. May crush, mix with water and give sublingually if needed.   megestrol 40 MG tablet Commonly known as: MEGACE Take 1 tablet (40 mg total) by mouth 2 (two) times daily.   mesalamine 1.2 g EC tablet Commonly known as: LIALDA Take 2 tablets (2.4 g total) by mouth daily with breakfast. Start taking on: July 19, 2020   methimazole 10 MG tablet Commonly known as: TAPAZOLE Take 2 tablets (20 mg total) by mouth daily.   oxyCODONE-acetaminophen 5-325 MG tablet Commonly known as: Percocet Take 1 tablet by mouth every 8 (eight) hours as needed for up to 5 days for severe pain.   prochlorperazine 10 MG tablet Commonly known as: COMPAZINE Take 1 tablet (10 mg total) by mouth every 4 (four) hours as needed for nausea or vomiting. May crush, mix with water and give sublingually.   propranolol 20 MG tablet Commonly known as: INDERAL Take 1 tablet (20 mg total) by mouth 2 (two) times daily.       Allergies  Allergen Reactions  . Asa [Aspirin] Other (See Comments)    Ulcerative colitis flares.  . Nsaids Other (See Comments)    Ulcerative colitis flares  .  Penicillins Rash and Other (See Comments)    Has patient had a PCN reaction causing immediate rash, facial/tongue/throat swelling, SOB or lightheadedness with hypotension:unsure Has patient had a PCN reaction causing severe rash involving mucus membranes or skin necrosis:unsure Has patient had a PCN reaction that required hospitalization:No Has patient had a PCN reaction occurring within the last 10 years:Yes If all of the above answers are "NO", then may proceed with Cephalosporin use.     Consultations:  none   Procedures/Studies: CT Head Wo Contrast  Result Date: 07/13/2020 CLINICAL DATA:  Seizure, abnormal neuro exam. Additional history provided: Witness seizure EXAM: CT HEAD WITHOUT CONTRAST TECHNIQUE: Contiguous axial images were obtained from the base of the skull through the vertex without intravenous contrast. COMPARISON:  Prior head CT 10/21/2018.  Prior brain MRI 10/21/2018. FINDINGS: Brain: Cerebral volume is normal for age. Redemonstrated small chronic infarct within the superior aspect of the paramedian right occipital lobe (for instance as seen on series 2, image 19) (series 5, image 20). Mild ill-defined hypoattenuation within the cerebral white matter is nonspecific, but compatible with chronic small vessel ischemic disease. There is no acute intracranial hemorrhage. No acute demarcated cortical infarct. No extra-axial fluid collection. No evidence of intracranial mass. No midline shift. Vascular: No hyperdense vessel.  Atherosclerotic calcifications. Skull: Normal. Negative for fracture or focal lesion. Sinuses/Orbits: Visualized orbits show no acute finding. No significant paranasal sinus disease or mastoid effusion at the imaged levels. IMPRESSION: No evidence of acute intracranial abnormality. Redemonstrated small chronic cortical infarct within  the paramedian right occipital lobe. Mild cerebral white matter chronic small vessel ischemic disease, stable as compared to the brain  MRI of 10/21/2018. Electronically Signed   By: Kellie Simmering DO   On: 07/13/2020 07:53   MR BRAIN WO CONTRAST  Addendum Date: 07/13/2020   ADDENDUM REPORT: 07/13/2020 14:41 ADDENDUM: Dr. Malen Gauze called to discuss this case. Apparently there was a scan done in July in [facility] which suggested two metastatic lesions. We do not have those images for comparison. With this in mind, there are 2 small foci of T2 and FLAIR signal that were not definitely present in January of this year. One is a 4 mm focus in the right occipital region axial FLAIR image 20 and the other is a 3 mm focus of signal in the subcortical U fibers of the right posterior temporal lobe axial FLAIR image 22. Neither of these has mass effect or vasogenic edema. Attention to these areas if we can get a contrast enhanced scan. Electronically Signed   By: Nelson Chimes M.D.   On: 07/13/2020 14:41   Result Date: 07/13/2020 CLINICAL DATA:  Seizure after cocaine use. EXAM: MRI HEAD WITHOUT CONTRAST TECHNIQUE: Multiplanar, multiecho pulse sequences of the brain and surrounding structures were obtained without intravenous contrast. COMPARISON:  Head CT same day.  MRI 10/21/2018. FINDINGS: Brain: The study suffers from motion degradation. Diffusion imaging does not show any acute or subacute infarction or other cause of restricted diffusion. No focal abnormality affects the brainstem or cerebellum. There is an old cortical and subcortical infarction scratched at there are old cortical and subcortical infarctions in the right occipital lobe. Chronic small-vessel ischemic changes affect the hemispheric white matter particularly in the frontal lobes. No mass lesion, hemorrhage, hydrocephalus or extra-axial collection. Vascular: Major vessels at the base of the brain show flow. Skull and upper cervical spine: Negative Sinuses/Orbits: Clear/normal Other: None IMPRESSION: Motion degraded exam. No acute or reversible finding. Old right occipital  cortical and subcortical infarctions. Chronic small-vessel ischemic changes of the hemispheric white matter. Electronically Signed: By: Nelson Chimes M.D. On: 07/13/2020 12:31   CT ABDOMEN PELVIS W CONTRAST  Result Date: 07/17/2020 CLINICAL DATA:  Abdominal pain history of ulcerative colitis EXAM: CT ABDOMEN AND PELVIS WITH CONTRAST TECHNIQUE: Multidetector CT imaging of the abdomen and pelvis was performed using the standard protocol following bolus administration of intravenous contrast. CONTRAST:  131m OMNIPAQUE IOHEXOL 300 MG/ML  SOLN COMPARISON:  None. FINDINGS: Lower chest: The visualized heart size within normal limits. No pericardial fluid/thickening. No hiatal hernia. The visualized portions of the lungs are clear. Hepatobiliary: The liver is normal in density without focal abnormality.The main portal vein is patent. No evidence of calcified gallstones, gallbladder wall thickening or biliary dilatation. Pancreas: Unremarkable. No pancreatic ductal dilatation or surrounding inflammatory changes. Spleen: Normal in size without focal abnormality. Adrenals/Urinary Tract: Both adrenal glands appear normal. The kidneys and collecting system appear normal without evidence of urinary tract calculus or hydronephrosis. Bladder is unremarkable. Stomach/Bowel: The stomach, small bowel are normal in appearance. There appears to be diffuse wall thickening with hyperenhancement of the mucosa involving the descending colon sigmoid colon and rectum. There is mild mesenteric edema seen within this area. No loculated fluid collections or free air is seen. Vascular/Lymphatic: There are no enlarged mesenteric, retroperitoneal, or pelvic lymph nodes. Scattered aortic atherosclerotic calcifications are seen without aneurysmal dilatation. Reproductive: The uterus and adnexa are unremarkable. Other: No evidence of abdominal wall mass or hernia. Musculoskeletal: No acute or  significant osseous findings. IMPRESSION: Findings  suggestive of diffuse mild colitis involving the descending colon sigmoid colon and rectum, consistent with the patient's known inflammatory colitis. No loculated fluid collections or free air. Aortic Atherosclerosis (ICD10-I70.0). Electronically Signed   By: Prudencio Pair M.D.   On: 07/17/2020 19:51   DG Chest Portable 1 View  Result Date: 07/13/2020 CLINICAL DATA:  Seizures EXAM: PORTABLE CHEST 1 VIEW COMPARISON:  12/13/2016 FINDINGS: Shallow inspiration. Heart size is normal. Bilateral basilar infiltrates could represent pneumonia or edema. No pleural effusions. No pneumothorax. Right power port type central venous catheter with tip over the low SVC region. IMPRESSION: Bilateral basilar infiltrates. Electronically Signed   By: Lucienne Capers M.D.   On: 07/13/2020 06:39   EEG adult  Result Date: 07/13/2020 Lora Havens, MD     07/13/2020 11:20 AM Patient Name: Holly Weber MRN: [id number] Epilepsy Attending: Lora Havens Referring Physician/Provider: Dr. Karmen Bongo Date: 07/12/2020 Duration: 23.07 minutes Patient history: 54 year old female presented to the hospital with seizures in the setting of cocaine use.  EEG to evaluate for seizures. Level of alertness: Awake, asleep AEDs during EEG study: LEV Technical aspects: This EEG study was done with scalp electrodes positioned according to the 10-20 International system of electrode placement. Electrical activity was acquired at a sampling rate of 500Hz  and reviewed with a high frequency filter of 70Hz  and a low frequency filter of 1Hz . EEG data were recorded continuously and digitally stored. Description: During awake state, no clear posterior dominant rhythm was seen.  Sleep was characterized by sleep spindles (12 to 14 Hz), maximal frontocentral region.  EEG showed continuous generalized polymorphic 5 to 6 Hz theta slowing and intermittent 2 to 3 Hz delta slowing. Hyperventilation and photic stimulation were not performed.   ABNORMALITY  -Continuous slow, generalized IMPRESSION: This study is  suggestive of moderate diffuse encephalopathy, nonspecific etiology but likely related to sedation, post-ictal state. No seizures or definite epileptiform discharges were seen throughout the recording. Priyanka Barbra Sarks     Subjective: Patient seen and examined bedside, daughter present.  Patient states was able to tolerate her breakfast without any further abdominal pain or nausea.  Patient understands her disease prognosis is extremely poor and would like to return home with her father under hospice care.  Requesting appetite stimulant on discharge.  No other questions or concerns at this time.  Denies headache, no visual changes, no chest pain, no palpitations, no shortness of breath, no abdominal pain, no weakness.  No acute events overnight per nursing staff.  Discharge Exam: Vitals:   07/18/20 1300 07/18/20 1315  BP: (!) 124/108 121/70  Pulse: (!) 109 (!) 104  Resp: 16 20  Temp:  98 F (36.7 C)  SpO2: 95% 92%   Vitals:   07/18/20 1230 07/18/20 1245 07/18/20 1300 07/18/20 1315  BP: 137/78 (!) 102/57 (!) 124/108 121/70  Pulse: (!) 113 (!) 115 (!) 109 (!) 104  Resp: 20 13 16 20   Temp:    98 F (36.7 C)  TempSrc:      SpO2: 93% 92% 95% 92%  Weight:      Height:        General: Pt is alert, awake, not in acute distress, appears older than stated age Cardiovascular: RRR, S1/S2 +, no rubs, no gallops Respiratory: CTA bilaterally, no wheezing, no rhonchi on room air Abdominal: Soft, NT, ND, bowel sounds + Extremities: no edema, no cyanosis    The results of significant diagnostics from this hospitalization (  including imaging, microbiology, ancillary and laboratory) are listed below for reference.     Microbiology: Recent Results (from the past 240 hour(s))  Respiratory Panel by RT PCR (Flu A&B, Covid) - Nasopharyngeal Swab     Status: None   Collection Time: 07/13/20  6:21 AM   Specimen: Nasopharyngeal Swab  Result  Value Ref Range Status   SARS Coronavirus 2 by RT PCR NEGATIVE NEGATIVE Final    Comment: (NOTE) SARS-CoV-2 target nucleic acids are NOT DETECTED.  The SARS-CoV-2 RNA is generally detectable in upper respiratoy specimens during the acute phase of infection. The lowest concentration of SARS-CoV-2 viral copies this assay can detect is 131 copies/mL. A negative result does not preclude SARS-Cov-2 infection and should not be used as the sole basis for treatment or other patient management decisions. A negative result may occur with  improper specimen collection/handling, submission of specimen other than nasopharyngeal swab, presence of viral mutation(s) within the areas targeted by this assay, and inadequate number of viral copies (<131 copies/mL). A negative result must be combined with clinical observations, patient history, and epidemiological information. The expected result is Negative.  Fact Sheet for Patients:  PinkCheek.be  Fact Sheet for Healthcare Providers:  GravelBags.it  This test is no t yet approved or cleared by the [place] FDA and  has been authorized for detection and/or diagnosis of SARS-CoV-2 by FDA under an Emergency Use Authorization (EUA). This EUA will remain  in effect (meaning this test can be used) for the duration of the COVID-19 declaration under Section 564(b)(1) of the Act, 21 U.S.C. section 360bbb-3(b)(1), unless the authorization is terminated or revoked sooner.     Influenza A by PCR NEGATIVE NEGATIVE Final   Influenza B by PCR NEGATIVE NEGATIVE Final    Comment: (NOTE) The Xpert Xpress SARS-CoV-2/FLU/RSV assay is intended as an aid in  the diagnosis of influenza from Nasopharyngeal swab specimens and  should not be used as a sole basis for treatment. Nasal washings and  aspirates are unacceptable for Xpert Xpress SARS-CoV-2/FLU/RSV  testing.  Fact Sheet for  Patients: PinkCheek.be  Fact Sheet for Healthcare Providers: GravelBags.it  This test is not yet approved or cleared by the [place] FDA and  has been authorized for detection and/or diagnosis of SARS-CoV-2 by  FDA under an Emergency Use Authorization (EUA). This EUA will remain  in effect (meaning this test can be used) for the duration of the  Covid-19 declaration under Section 564(b)(1) of the Act, 21  U.S.C. section 360bbb-3(b)(1), unless the authorization is  terminated or revoked. Performed at [facility], [place] [address] [place], [place] [address]   Blood culture (routine x 2)     Status: None   Collection Time: 07/13/20  7:17 AM   Specimen: BLOOD  Result Value Ref Range Status   Specimen Description   Final    BLOOD RIGHT ANTECUBITAL Performed at [facility] [address] [place], [place] [address]    Special Requests   Final    BOTTLES DRAWN AEROBIC ONLY Blood Culture results may not be optimal due to an inadequate volume of blood received in culture bottles Performed at [facility] [address] [place], [place] [address]    Culture   Final    NO GROWTH 5 DAYS Performed at [facility], [place] [address], [place], [place] [address]    Report Status 07/18/2020 FINAL  Final  Blood culture (routine x 2)  Status: None   Collection Time: 07/13/20  7:17 AM   Specimen: BLOOD  Result Value Ref Range Status   Specimen Description   Final    BLOOD LEFT ANTECUBITAL Performed at [facility] [address], [place], [place] [address]    Special Requests   Final    BOTTLES DRAWN AEROBIC AND ANAEROBIC Blood Culture adequate volume Performed at [facility] [address] [place], [place] [address]    Culture   Final    NO GROWTH 5 DAYS Performed at [facility], [place] [address] [place],  [place] [address]    Report Status 07/18/2020 FINAL  Final     Labs: BNP (last 3 results) No results for input(s): BNP in the last 8760 hours. Basic Metabolic Panel: Recent Labs  Lab 07/13/20 0605 07/13/20 0630 07/17/20 1529 07/18/20 0010 07/18/20 0335  NA 137 136 136  --  136  K 4.7 4.1 3.7  --  3.8  CL 106 106 104  --  105  CO2  --  23 20*  --  22  GLUCOSE 229* 195* 287*  --  258*  BUN 8 10 7   --  5*  CREATININE 0.40* 0.54 0.60 0.50 0.51  CALCIUM  --  8.9 9.0  --  8.9   Liver Function Tests: Recent Labs  Lab 07/13/20 0630 07/17/20 1529  AST 31 37  ALT 27 25  ALKPHOS 127* 124  BILITOT 1.9* 0.5  PROT 7.4 7.1  ALBUMIN 3.3* 3.0*   Recent Labs  Lab 07/17/20 1529  LIPASE 29   No results for input(s): AMMONIA in the last 168 hours. CBC: Recent Labs  Lab 07/13/20 0534 07/13/20 0605 07/17/20 1529 07/18/20 0010 07/18/20 0335  WBC 13.4*  --  10.1 13.6* 11.2*  NEUTROABS 7.9*  --  6.7  --   --   HGB 15.5* 16.7* 13.6 13.6 13.5  HCT 48.5* 49.0* 45.8 43.5 43.2  MCV 81.6  --  86.9 82.9 84.4  PLT 257  --  166 201 177   Cardiac Enzymes: No results for input(s): CKTOTAL, CKMB, CKMBINDEX, TROPONINI in the last 168 hours. BNP: Invalid input(s): POCBNP CBG: Recent Labs  Lab 07/13/20 2117 07/14/20 0812 07/14/20 1203  GLUCAP 120* 114* 187*   D-Dimer No results for input(s): DDIMER in the last 72 hours. Hgb A1c No results for input(s): HGBA1C in the last 72 hours. Lipid Profile No results for input(s): CHOL, HDL, LDLCALC, TRIG, CHOLHDL, LDLDIRECT in the last 72 hours. Thyroid function studies No results for input(s): TSH, T4TOTAL, T3FREE, THYROIDAB in the last 72 hours.  Invalid input(s): FREET3 Anemia work up No results for input(s): VITAMINB12, FOLATE, FERRITIN, TIBC, IRON, RETICCTPCT in the last 72 hours. Urinalysis    Component Value Date/Time   COLORURINE YELLOW 07/13/2020 0620   APPEARANCEUR CLEAR 07/13/2020 0620   LABSPEC 1.016 07/13/2020 0620   PHURINE  5.0 07/13/2020 0620   GLUCOSEU NEGATIVE 07/13/2020 0620   HGBUR NEGATIVE 07/13/2020 0620   BILIRUBINUR NEGATIVE 07/13/2020 0620   KETONESUR 5 (A) 07/13/2020 0620   PROTEINUR 30 (A) 07/13/2020 0620   NITRITE NEGATIVE 07/13/2020 0620   LEUKOCYTESUR NEGATIVE 07/13/2020 0620   Sepsis Labs Invalid input(s): PROCALCITONIN,  WBC,  LACTICIDVEN Microbiology Recent Results (from the past 240 hour(s))  Respiratory Panel by RT PCR (Flu A&B, Covid) - Nasopharyngeal Swab     Status: None   Collection Time: 07/13/20  6:21 AM   Specimen: Nasopharyngeal Swab  Result  Value Ref Range Status   SARS Coronavirus 2 by RT PCR NEGATIVE NEGATIVE Final    Comment: (NOTE) SARS-CoV-2 target nucleic acids are NOT DETECTED.  The SARS-CoV-2 RNA is generally detectable in upper respiratoy specimens during the acute phase of infection. The lowest concentration of SARS-CoV-2 viral copies this assay can detect is 131 copies/mL. A negative result does not preclude SARS-Cov-2 infection and should not be used as the sole basis for treatment or other patient management decisions. A negative result may occur with  improper specimen collection/handling, submission of specimen other than nasopharyngeal swab, presence of viral mutation(s) within the areas targeted by this assay, and inadequate number of viral copies (<131 copies/mL). A negative result must be combined with clinical observations, patient history, and epidemiological information. The expected result is Negative.  Fact Sheet for Patients:  PinkCheek.be  Fact Sheet for Healthcare Providers:  GravelBags.it  This test is no t yet approved or cleared by the [place] FDA and  has been authorized for detection and/or diagnosis of SARS-CoV-2 by FDA under an Emergency Use Authorization (EUA). This EUA will remain  in effect (meaning this test can be used) for the duration of the COVID-19 declaration  under Section 564(b)(1) of the Act, 21 U.S.C. section 360bbb-3(b)(1), unless the authorization is terminated or revoked sooner.     Influenza A by PCR NEGATIVE NEGATIVE Final   Influenza B by PCR NEGATIVE NEGATIVE Final    Comment: (NOTE) The Xpert Xpress SARS-CoV-2/FLU/RSV assay is intended as an aid in  the diagnosis of influenza from Nasopharyngeal swab specimens and  should not be used as a sole basis for treatment. Nasal washings and  aspirates are unacceptable for Xpert Xpress SARS-CoV-2/FLU/RSV  testing.  Fact Sheet for Patients: PinkCheek.be  Fact Sheet for Healthcare Providers: GravelBags.it  This test is not yet approved or cleared by the [place] FDA and  has been authorized for detection and/or diagnosis of SARS-CoV-2 by  FDA under an Emergency Use Authorization (EUA). This EUA will remain  in effect (meaning this test can be used) for the duration of the  Covid-19 declaration under Section 564(b)(1) of the Act, 21  U.S.C. section 360bbb-3(b)(1), unless the authorization is  terminated or revoked. Performed at [facility], [place] [address], [place], [place] [address]   Blood culture (routine x 2)     Status: None   Collection Time: 07/13/20  7:17 AM   Specimen: BLOOD  Result Value Ref Range Status   Specimen Description   Final    BLOOD RIGHT ANTECUBITAL Performed at [facility] [address], [place], [place] [address]    Special Requests   Final    BOTTLES DRAWN AEROBIC ONLY Blood Culture results may not be optimal due to an inadequate volume of blood received in culture bottles Performed at [facility] [address], [place], [place] [address]    Culture   Final    NO GROWTH 5 DAYS Performed at [facility], [place] [address] [place], [place] [address]    Report Status 07/18/2020 FINAL  Final  Blood culture (routine x 2)      Status: None   Collection Time: 07/13/20  7:17 AM   Specimen: BLOOD  Result Value Ref Range Status   Specimen Description   Final    BLOOD LEFT ANTECUBITAL Performed at [facility] [address] [place], [place] [address]    Special Requests   Final  BOTTLES DRAWN AEROBIC AND ANAEROBIC Blood Culture adequate volume Performed at [facility] [address] [place], [place] [address]    Culture   Final    NO GROWTH 5 DAYS Performed at [facility], [place] [address], [place],  [address]    Report Status 07/18/2020 FINAL  Final     Time coordinating discharge: Over 30 minutes  SIGNED:   Derrick Tiegs J [place] DO  [facility] 07/18/2020, 2:48 PM

## 2020-07-18 NOTE — ED Notes (Signed)
Ordered breakfast 

## 2020-07-18 NOTE — ED Notes (Signed)
Patient care assumed at this time. Patient is alert, oriented and ambulating with steady gait. PIV infusing MIVF as ordered. Site is [facility]. Patient provided with hygiene items as requested and assisted to sit on side of bed to perform self care. Clean non skid socks provided with gown. Patient noted to have dry itchy skin on legs. Lotion provided. Patient reports pain of 3/10 in lower abd, chronic per patient and much better than previously. She tolerated breakfast very well and finished her whole tray.

## 2020-07-18 NOTE — ED Notes (Signed)
Pt given dc instructions and medications from the pharmacy. Pt verbalizes understanding

## 2020-07-18 NOTE — Discharge Planning (Signed)
RNCM consulted regarding pt interest in moving forward with home hospice.  RNCM notes that pt has been referred to [facility].  RNCM contacted Venia Carbon, RN with [facility] to visit with pt at bedside for disposition needs.  [facility] will be here within the hour.

## 2020-07-18 NOTE — Discharge Planning (Signed)
RNCM reinstated pt as pt is uninsured and can not afford Rx.  [facility] will deliver Rx to patient at bedside prior to discharge from hospital.

## 2020-07-18 NOTE — Progress Notes (Signed)
CSW received call from Dr. [place] regarding this patient's desire to discharge home with home hospice services.  Rosendo Gros, RN CM has spoken with [facility] and they are aware of this patient.  Madilyn Fireman, MSW, LCSW-A Transitions of Care  Clinical Social Worker  [facility]  Medical ICU [phone]

## 2020-07-18 NOTE — ED Notes (Signed)
Patient d/c in wheelchair. PIV d/c intact. Pressure and dressing applied. Patient wearing all her own clothing. Alert, oriented, and understanding of plan for care w Hospice at home. Patient's support person ambulated to car with her.

## 2020-07-19 LAB — ABO/RH: ABO/RH(D): AB POS

## 2020-07-20 ENCOUNTER — Telehealth: Payer: Self-pay | Admitting: *Deleted

## 2020-07-20 NOTE — Telephone Encounter (Signed)
Received call from this patient. She is inquirng about getting samples of Mesalamine for her UC.Advised that shen is not an active patient in our clinic at this time Also  Advised that we do not have medication samples here,  in addition, we do not treat UC in this clinic.The patient states the medication is too expensive. Again advised that we cannot assist pt with this. Advised that I would send message to hospital SW Madilyn Fireman as pt was recently discharged for the hospital on 07/18/20. Noted that pt will be meeting with [facility] due to her cancer status.  Confirmed with [facility] that they are scheduled to see pt today for initial intact.

## 2020-08-03 ENCOUNTER — Inpatient Hospital Stay: Payer: Self-pay

## 2020-08-03 ENCOUNTER — Inpatient Hospital Stay: Payer: Self-pay | Attending: Hematology and Oncology | Admitting: Hematology and Oncology

## 2020-08-03 ENCOUNTER — Telehealth: Payer: Self-pay | Admitting: *Deleted

## 2020-08-03 ENCOUNTER — Other Ambulatory Visit: Payer: Self-pay

## 2020-08-03 VITALS — BP 153/82 | HR 122 | Temp 96.3°F | Resp 21 | Ht 65.0 in | Wt 169.2 lb

## 2020-08-03 DIAGNOSIS — K5190 Ulcerative colitis, unspecified, without complications: Secondary | ICD-10-CM | POA: Insufficient documentation

## 2020-08-03 DIAGNOSIS — Z8673 Personal history of transient ischemic attack (TIA), and cerebral infarction without residual deficits: Secondary | ICD-10-CM | POA: Insufficient documentation

## 2020-08-03 DIAGNOSIS — I10 Essential (primary) hypertension: Secondary | ICD-10-CM | POA: Insufficient documentation

## 2020-08-03 DIAGNOSIS — C7A8 Other malignant neuroendocrine tumors: Secondary | ICD-10-CM | POA: Insufficient documentation

## 2020-08-03 DIAGNOSIS — K529 Noninfective gastroenteritis and colitis, unspecified: Secondary | ICD-10-CM

## 2020-08-03 DIAGNOSIS — R110 Nausea: Secondary | ICD-10-CM | POA: Insufficient documentation

## 2020-08-03 DIAGNOSIS — M549 Dorsalgia, unspecified: Secondary | ICD-10-CM | POA: Insufficient documentation

## 2020-08-03 DIAGNOSIS — F1910 Other psychoactive substance abuse, uncomplicated: Secondary | ICD-10-CM

## 2020-08-03 DIAGNOSIS — C7B8 Other secondary neuroendocrine tumors: Secondary | ICD-10-CM

## 2020-08-03 DIAGNOSIS — E079 Disorder of thyroid, unspecified: Secondary | ICD-10-CM | POA: Insufficient documentation

## 2020-08-03 DIAGNOSIS — M79605 Pain in left leg: Secondary | ICD-10-CM | POA: Insufficient documentation

## 2020-08-03 DIAGNOSIS — M79604 Pain in right leg: Secondary | ICD-10-CM | POA: Insufficient documentation

## 2020-08-03 DIAGNOSIS — F17210 Nicotine dependence, cigarettes, uncomplicated: Secondary | ICD-10-CM | POA: Insufficient documentation

## 2020-08-03 DIAGNOSIS — F1410 Cocaine abuse, uncomplicated: Secondary | ICD-10-CM | POA: Insufficient documentation

## 2020-08-03 DIAGNOSIS — C3490 Malignant neoplasm of unspecified part of unspecified bronchus or lung: Secondary | ICD-10-CM

## 2020-08-03 DIAGNOSIS — F329 Major depressive disorder, single episode, unspecified: Secondary | ICD-10-CM | POA: Insufficient documentation

## 2020-08-03 DIAGNOSIS — E876 Hypokalemia: Secondary | ICD-10-CM | POA: Insufficient documentation

## 2020-08-03 DIAGNOSIS — R109 Unspecified abdominal pain: Secondary | ICD-10-CM | POA: Insufficient documentation

## 2020-08-03 LAB — CBC WITH DIFFERENTIAL (CANCER CENTER ONLY)
Abs Immature Granulocytes: 0.01 10*3/uL (ref 0.00–0.07)
Basophils Absolute: 0 10*3/uL (ref 0.0–0.1)
Basophils Relative: 0 %
Eosinophils Absolute: 0.1 10*3/uL (ref 0.0–0.5)
Eosinophils Relative: 3 %
HCT: 37.5 % (ref 36.0–46.0)
Hemoglobin: 12.5 g/dL (ref 12.0–15.0)
Immature Granulocytes: 0 %
Lymphocytes Relative: 36 %
Lymphs Abs: 1.8 10*3/uL (ref 0.7–4.0)
MCH: 26.8 pg (ref 26.0–34.0)
MCHC: 33.3 g/dL (ref 30.0–36.0)
MCV: 80.3 fL (ref 80.0–100.0)
Monocytes Absolute: 0.5 10*3/uL (ref 0.1–1.0)
Monocytes Relative: 10 %
Neutro Abs: 2.5 10*3/uL (ref 1.7–7.7)
Neutrophils Relative %: 51 %
Platelet Count: 230 10*3/uL (ref 150–400)
RBC: 4.67 MIL/uL (ref 3.87–5.11)
RDW: 14.2 % (ref 11.5–15.5)
WBC Count: 4.9 10*3/uL (ref 4.0–10.5)
nRBC: 0 % (ref 0.0–0.2)

## 2020-08-03 LAB — CMP (CANCER CENTER ONLY)
ALT: 222 U/L — ABNORMAL HIGH (ref 0–44)
AST: 38 U/L (ref 15–41)
Albumin: 3.1 g/dL — ABNORMAL LOW (ref 3.5–5.0)
Alkaline Phosphatase: 171 U/L — ABNORMAL HIGH (ref 38–126)
Anion gap: 9 (ref 5–15)
BUN: 7 mg/dL (ref 6–20)
CO2: 26 mmol/L (ref 22–32)
Calcium: 8.6 mg/dL — ABNORMAL LOW (ref 8.9–10.3)
Chloride: 105 mmol/L (ref 98–111)
Creatinine: 0.63 mg/dL (ref 0.44–1.00)
GFR, Estimated: >60 mL/min (ref >60)
Glucose, Bld: 178 mg/dL — ABNORMAL HIGH (ref 70–99)
Potassium: 2.4 mmol/L — CL (ref 3.5–5.1)
Sodium: 140 mmol/L (ref 135–145)
Total Bilirubin: 0.9 mg/dL (ref 0.3–1.2)
Total Protein: 7 g/dL (ref 6.5–8.1)

## 2020-08-03 LAB — LACTATE DEHYDROGENASE: LDH: 209 U/L — ABNORMAL HIGH (ref 98–192)

## 2020-08-03 MED ORDER — OXYCODONE HCL 5 MG PO TABS
5.0000 mg | ORAL_TABLET | ORAL | 0 refills | Status: DC | PRN
Start: 2020-08-03 — End: 2020-08-09

## 2020-08-03 MED ORDER — POTASSIUM CHLORIDE CRYS ER 20 MEQ PO TBCR: 20.0000 meq | EXTENDED_RELEASE_TABLET | Freq: Two times a day (BID) | ORAL | 0 refills | Status: AC

## 2020-08-03 MED ORDER — ONDANSETRON HCL 8 MG PO TABS: 8.0000 mg | ORAL_TABLET | Freq: Three times a day (TID) | ORAL | 0 refills | Status: DC | PRN

## 2020-08-03 NOTE — Telephone Encounter (Signed)
CRITICAL VALUE STICKER  CRITICAL VALUE: potassium 2.4  RECEIVER (on-site recipient of call):Sandi K, RN  DATE & TIME NOTIFIED: 08/03/20;1115  MESSENGER (representative from lab):Pam  MD NOTIFIED: Dr. Lorenso Courier  TIME OF NOTIFICATION: 1125  RESPONSE: Information acknowledged - no orders received

## 2020-08-07 ENCOUNTER — Telehealth: Payer: Self-pay | Admitting: *Deleted

## 2020-08-07 NOTE — Telephone Encounter (Signed)
Patient called asking for test results (not sure what results in question), treatment plan (non on file) and no notation of plan.   States she exhausted all of her oxycodone 5 mg which was taken more than written for. Per script she should have remaining tablets.  She states her pain was severe and oxycodone is ineffective.    Routed to MD to review and advise.

## 2020-08-08 ENCOUNTER — Encounter: Payer: Self-pay | Admitting: Hematology and Oncology

## 2020-08-08 NOTE — Progress Notes (Signed)
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 CTA chest showed extensive mediastinal and hilar as well as lower neck lymphadenopathy and she was found to have 2 masses in the right lung, one associated with the pleura and one more parenchymal 2) 04/09/2020: underwent an ultrasound-guided biopsy of the left supraclavicular lymph node and pathology was consistent with metastatic carcinoma with neuroendocrine features, Ki-67 60%, TTF-1 positive. MRI brain suspicious for metastatic disease.  3) 04/14/2020: received 1 cycle of carboplatin/etoposide. She was advised to follow-up in [facility] with medical and radiation oncology.  Is not clear if she followed up as advised. 4) 07/11/2020:  released from prison early on medical leave  5) 05/24/2020: patient seen by Dr. Altamease Oiler at [facility]. At this time she noted she did not have interest in pursuing further chemotherapy.  6) 07/17/2020-07/18/2020: admitted to [facility] for UC flare 7) 08/03/2020: establish care with Dr. Lorenso Courier   CHIEF COMPLAINTS/PURPOSE OF CONSULTATION:  "Metastatic Neuroendocrine Tumor of the Lung "  HISTORY OF PRESENTING ILLNESS:  Holly Weber 54 y.o. female with medical history significant for ulcerative colitis, depression, hepatitis C, hypertension, stroke, thyroid disease and metastatic neuroendocrine tumor of the lung who presents for evaluation of the metastatic cancer.   On review of the previous records Holly Weber initially diagnosed with metastatic neuroendocrine tumor of the lung during hospital admission from 04/07/2020 to 04/18/2020 at [facility].  The full details  regarding her diagnosis are listed above, but briefly she underwent a CTA of the chest due to shortness of breath and was found to have extensive hilar and mediastinal lymphadenopathy.  A ultrasound-guided biopsy of the left supraclavicular lymph node was performed and showed findings consistent with metastatic neuroendocrine tumor.  She received her first cycle of carbo etoposide on 04/14/2020 and was subsequently lost to follow-up.  When she was last seen on 05/24/2020 by Dr. Altamease Oiler at [facility] the patient noted that she did not wish to pursue any further chemotherapy.  She was admitted to [facility] for ulcerative colitis flare on 07/17/2020.  She presents today to discuss options regarding her cancer care.  On exam today Holly Weber is difficult to follow and quite scattered.  It is difficult to get her to provide the necessary information as she just from topic to topic at rapid pace.  She notes that she is in pain but is unable to clarify exactly why.  She reports that it is predominantly in the back, the legs, and her abdomen.  She reports that she does feel nauseated.  She reports she is aware that she has metastatic disease and limited time to live, however she has had a change in her opinion regarding hospice care and wants to consider treatment options for her disease.  She is an active smoker and does actively abuse crack cocaine.  She reports that she is not having any issues with fevers, chills, sweats, vomiting, headache, or vision changes at this time.  A full 10 point ROS is listed below.  MEDICAL HISTORY:  Past Medical History:  Diagnosis Date  . Cancer ([place])   . Colitis, ulcerative ([place])   . Depression   . Hepatitis C   . Hypertension   .  Stroke ([place])   . Thyroid disease     SURGICAL HISTORY: Past Surgical History:  Procedure Laterality Date  . ARM WOUND REPAIR / CLOSURE    . COLONOSCOPY    . OPEN REDUCTION INTERNAL FIXATION (ORIF) DISTAL RADIAL FRACTURE Left  01/07/2018   Procedure: OPEN REDUCTION INTERNAL FIXATION (ORIF) DISTAL RADIAL FRACTURE;  Surgeon: Leanora Cover, MD;  Location: [facility];  Service: Orthopedics;  Laterality: Left;  . TEE WITHOUT CARDIOVERSION N/A 12/17/2016   Procedure: TRANSESOPHAGEAL ECHOCARDIOGRAM (TEE);  Surgeon: Josue Hector, MD;  Location: [facility];  Service: Cardiovascular;  Laterality: N/A;  . TONSILLECTOMY      SOCIAL HISTORY: Social History   Socioeconomic History  . Marital status: Widowed    Spouse name: Not on file  . Number of children: Not on file  . Years of education: Not on file  . Highest education level: Not on file  Occupational History  . Not on file  Tobacco Use  . Smoking status: Current Every Day Smoker    Packs/day: 1.50    Types: Cigarettes  . Smokeless tobacco: Never Used  Substance and Sexual Activity  . Alcohol use: Yes    Comment: Daily   . Drug use: Yes    Types: Cocaine, Marijuana    Comment: Daily on both  . Sexual activity: Yes    Birth control/protection: None  Other Topics Concern  . Not on file  Social History Narrative  . Not on file   Social Determinants of Health   Financial Resource Strain:   . Difficulty of Paying Living Expenses: Not on file  Food Insecurity:   . Worried About Charity fundraiser in the Last Year: Not on file  . Ran Out of Food in the Last Year: Not on file  Transportation Needs:   . Lack of Transportation (Medical): Not on file  . Lack of Transportation (Non-Medical): Not on file  Physical Activity:   . Days of Exercise per Week: Not on file  . Minutes of Exercise per Session: Not on file  Stress:   . Feeling of Stress : Not on file  Social Connections:   . Frequency of Communication with Friends and Family: Not on file  . Frequency of Social Gatherings with Friends and Family: Not on file  . Attends Religious Services: Not on file  . Active Member of Clubs or Organizations: Not on file  . Attends Theatre manager Meetings: Not on file  . Marital Status: Not on file  Intimate Partner Violence:   . Fear of Current or Ex-Partner: Not on file  . Emotionally Abused: Not on file  . Physically Abused: Not on file  . Sexually Abused: Not on file    FAMILY HISTORY: Family History  Problem Relation Age of Onset  . Hypertension Mother   . Hypertension Father     ALLERGIES:  is allergic to asa [aspirin], nsaids, and penicillins.  MEDICATIONS:  Current Outpatient Medications  Medication Sig Dispense Refill  . levETIRAcetam (KEPPRA) 500 MG tablet Take 1 tablet (500 mg total) by mouth 2 (two) times daily. 60 tablet 2  . LORazepam (ATIVAN) 0.5 MG tablet Take 1 tablet (0.5 mg total) by mouth every 4 (four) hours as needed for anxiety. May crush, mix with water and give sublingually if needed. 42 tablet 0  . megestrol (MEGACE) 40 MG tablet Take 1 tablet (40 mg total) by mouth 2 (two) times daily. 60 tablet 0  . mesalamine (LIALDA)  1.2 g EC tablet Take 2 tablets (2.4 g total) by mouth daily with breakfast. 180 tablet 0  . methimazole (TAPAZOLE) 10 MG tablet Take 2 tablets (20 mg total) by mouth daily. 180 tablet 0  . ondansetron (ZOFRAN) 8 MG tablet Take 1 tablet (8 mg total) by mouth every 8 (eight) hours as needed for nausea or vomiting. 30 tablet 0  . oxyCODONE (OXY IR/ROXICODONE) 5 MG immediate release tablet Take 1 tablet (5 mg total) by mouth every 4 (four) hours as needed for severe pain. 30 tablet 0  . potassium chloride SA (KLOR-CON) 20 MEQ tablet Take 1 tablet (20 mEq total) by mouth 2 (two) times daily. 28 tablet 0  . prochlorperazine (COMPAZINE) 10 MG tablet Take 1 tablet (10 mg total) by mouth every 4 (four) hours as needed for nausea or vomiting. May crush, mix with water and give sublingually. 42 tablet 0  . propranolol (INDERAL) 20 MG tablet Take 1 tablet (20 mg total) by mouth 2 (two) times daily. 180 tablet 0   No current facility-administered medications for this visit.     REVIEW OF SYSTEMS:   Constitutional: ( - ) fevers, ( - )  chills , ( - ) night sweats Eyes: ( - ) blurriness of vision, ( - ) double vision, ( - ) watery eyes Ears, nose, mouth, throat, and face: ( - ) mucositis, ( - ) sore throat Respiratory: ( - ) cough, ( - ) dyspnea, ( - ) wheezes Cardiovascular: ( - ) palpitation, ( - ) chest discomfort, ( - ) lower extremity swelling Gastrointestinal:  ( - ) nausea, ( - ) heartburn, ( - ) change in bowel habits Skin: ( - ) abnormal skin rashes Lymphatics: ( - ) new lymphadenopathy, ( - ) easy bruising Neurological: ( - ) numbness, ( - ) tingling, ( - ) new weaknesses Behavioral/Psych: ( - ) mood change, ( - ) new changes  All other systems were reviewed with the patient and are negative.  PHYSICAL EXAMINATION: ECOG PERFORMANCE STATUS: 2 - Symptomatic, <50% confined to bed  Vitals:   08/03/20 0910  BP: (!) 153/82  Pulse: (!) 122  Resp: (!) 21  Temp: (!) 96.3 F (35.7 C)  SpO2: 98%   Filed Weights   08/03/20 0910  Weight: 169 lb 3.2 oz (76.7 kg)    GENERAL: chronically ill appearing middle aged female, appears agitated.   SKIN: skin color, texture, turgor are normal, no rashes or significant lesions EYES: conjunctiva are pink and non-injected, sclera clear LUNGS: clear to auscultation and percussion with normal breathing effort HEART: regular rate & rhythm and no murmurs and no lower extremity edema Musculoskeletal: no cyanosis of digits and no clubbing  PSYCH: alert & oriented x 3, fluent speech NEURO: no focal motor/sensory deficits  LABORATORY DATA:  I have reviewed the data as listed CBC Latest Ref Rng & Units 08/03/2020 07/18/2020 07/18/2020  WBC 4.0 - 10.5 K/uL 4.9 11.2(H) 13.6(H)  Hemoglobin 12.0 - 15.0 g/dL 12.5 13.5 13.6  Hematocrit 36 - 46 % 37.5 43.2 43.5  Platelets 150 - 400 K/uL 230 177 201    CMP Latest Ref Rng & Units 08/03/2020 07/18/2020 07/18/2020  Glucose 70 - 99 mg/dL 178(H) 258(H) -  BUN 6 - 20 mg/dL 7  5(L) -  Creatinine 0.44 - 1.00 mg/dL 0.63 0.51 0.50  Sodium 135 - 145 mmol/L 140 136 -  Potassium 3.5 - 5.1 mmol/L 2.4(LL) 3.8 -  Chloride 98 - 111 mmol/L  105 105 -  CO2 22 - 32 mmol/L 26 22 -  Calcium 8.9 - 10.3 mg/dL 8.6(L) 8.9 -  Total Protein 6.5 - 8.1 g/dL 7.0 - -  Total Bilirubin 0.3 - 1.2 mg/dL 0.9 - -  Alkaline Phos 38 - 126 U/L 171(H) - -  AST 15 - 41 U/L 38 - -  ALT 0 - 44 U/L 222(H) - -    RADIOGRAPHIC STUDIES:  CT Head Wo Contrast  Result Date: 07/13/2020 CLINICAL DATA:  Seizure, abnormal neuro exam. Additional history provided: Witness seizure EXAM: CT HEAD WITHOUT CONTRAST TECHNIQUE: Contiguous axial images were obtained from the base of the skull through the vertex without intravenous contrast. COMPARISON:  Prior head CT 10/21/2018.  Prior brain MRI 10/21/2018. FINDINGS: Brain: Cerebral volume is normal for age. Redemonstrated small chronic infarct within the superior aspect of the paramedian right occipital lobe (for instance as seen on series 2, image 19) (series 5, image 20). Mild ill-defined hypoattenuation within the cerebral white matter is nonspecific, but compatible with chronic small vessel ischemic disease. There is no acute intracranial hemorrhage. No acute demarcated cortical infarct. No extra-axial fluid collection. No evidence of intracranial mass. No midline shift. Vascular: No hyperdense vessel.  Atherosclerotic calcifications. Skull: Normal. Negative for fracture or focal lesion. Sinuses/Orbits: Visualized orbits show no acute finding. No significant paranasal sinus disease or mastoid effusion at the imaged levels. IMPRESSION: No evidence of acute intracranial abnormality. Redemonstrated small chronic cortical infarct within the paramedian right occipital lobe. Mild cerebral white matter chronic small vessel ischemic disease, stable as compared to the brain MRI of 10/21/2018. Electronically Signed   By: Kellie Simmering DO   On: 07/13/2020 07:53   MR BRAIN WO  CONTRAST  Addendum Date: 07/13/2020   ADDENDUM REPORT: 07/13/2020 14:41 ADDENDUM: Dr. Malen Gauze called to discuss this case. Apparently there was a scan done in July in [facility] which suggested two metastatic lesions. We do not have those images for comparison. With this in mind, there are 2 small foci of T2 and FLAIR signal that were not definitely present in January of this year. One is a 4 mm focus in the right occipital region axial FLAIR image 20 and the other is a 3 mm focus of signal in the subcortical U fibers of the right posterior temporal lobe axial FLAIR image 22. Neither of these has mass effect or vasogenic edema. Attention to these areas if we can get a contrast enhanced scan. Electronically Signed   By: Nelson Chimes M.D.   On: 07/13/2020 14:41   Result Date: 07/13/2020 CLINICAL DATA:  Seizure after cocaine use. EXAM: MRI HEAD WITHOUT CONTRAST TECHNIQUE: Multiplanar, multiecho pulse sequences of the brain and surrounding structures were obtained without intravenous contrast. COMPARISON:  Head CT same day.  MRI 10/21/2018. FINDINGS: Brain: The study suffers from motion degradation. Diffusion imaging does not show any acute or subacute infarction or other cause of restricted diffusion. No focal abnormality affects the brainstem or cerebellum. There is an old cortical and subcortical infarction scratched at there are old cortical and subcortical infarctions in the right occipital lobe. Chronic small-vessel ischemic changes affect the hemispheric white matter particularly in the frontal lobes. No mass lesion, hemorrhage, hydrocephalus or extra-axial collection. Vascular: Major vessels at the base of the brain show flow. Skull and upper cervical spine: Negative Sinuses/Orbits: Clear/normal Other: None IMPRESSION: Motion degraded exam. No acute or reversible finding. Old right occipital cortical and subcortical infarctions. Chronic small-vessel ischemic changes of the hemispheric  white  matter. Electronically Signed: By: Nelson Chimes M.D. On: 07/13/2020 12:31   CT ABDOMEN PELVIS W CONTRAST  Result Date: 07/17/2020 CLINICAL DATA:  Abdominal pain history of ulcerative colitis EXAM: CT ABDOMEN AND PELVIS WITH CONTRAST TECHNIQUE: Multidetector CT imaging of the abdomen and pelvis was performed using the standard protocol following bolus administration of intravenous contrast. CONTRAST:  142m OMNIPAQUE IOHEXOL 300 MG/ML  SOLN COMPARISON:  None. FINDINGS: Lower chest: The visualized heart size within normal limits. No pericardial fluid/thickening. No hiatal hernia. The visualized portions of the lungs are clear. Hepatobiliary: The liver is normal in density without focal abnormality.The main portal vein is patent. No evidence of calcified gallstones, gallbladder wall thickening or biliary dilatation. Pancreas: Unremarkable. No pancreatic ductal dilatation or surrounding inflammatory changes. Spleen: Normal in size without focal abnormality. Adrenals/Urinary Tract: Both adrenal glands appear normal. The kidneys and collecting system appear normal without evidence of urinary tract calculus or hydronephrosis. Bladder is unremarkable. Stomach/Bowel: The stomach, small bowel are normal in appearance. There appears to be diffuse wall thickening with hyperenhancement of the mucosa involving the descending colon sigmoid colon and rectum. There is mild mesenteric edema seen within this area. No loculated fluid collections or free air is seen. Vascular/Lymphatic: There are no enlarged mesenteric, retroperitoneal, or pelvic lymph nodes. Scattered aortic atherosclerotic calcifications are seen without aneurysmal dilatation. Reproductive: The uterus and adnexa are unremarkable. Other: No evidence of abdominal wall mass or hernia. Musculoskeletal: No acute or significant osseous findings. IMPRESSION: Findings suggestive of diffuse mild colitis involving the descending colon sigmoid colon and rectum, consistent  with the patient's known inflammatory colitis. No loculated fluid collections or free air. Aortic Atherosclerosis (ICD10-I70.0). Electronically Signed   By: BPrudencio PairM.D.   On: 07/17/2020 19:51   DG Chest Portable 1 View  Result Date: 07/13/2020 CLINICAL DATA:  Seizures EXAM: PORTABLE CHEST 1 VIEW COMPARISON:  12/13/2016 FINDINGS: Shallow inspiration. Heart size is normal. Bilateral basilar infiltrates could represent pneumonia or edema. No pleural effusions. No pneumothorax. Right power port type central venous catheter with tip over the low SVC region. IMPRESSION: Bilateral basilar infiltrates. Electronically Signed   By: WLucienne CapersM.D.   On: 07/13/2020 06:39   EEG adult  Result Date: 07/13/2020 YLora Havens MD     07/13/2020 11:20 AM Patient Name: RKINSLEA FRANCESMRN: [id number] Attending: PLora HavensReferring Physician/Provider: Dr. JKarmen BongoDate: 07/12/2020 Duration: 23.07 minutes Patient history: 54year old female presented to the hospital with seizures in the setting of cocaine use.  EEG to evaluate for seizures. Level of alertness: Awake, asleep AEDs during EEG study: LEV Technical aspects: This EEG study was done with scalp electrodes positioned according to the 10-20 International system of electrode placement. Electrical activity was acquired at a sampling rate of 500Hz  and reviewed with a high frequency filter of 70Hz  and a low frequency filter of 1Hz . EEG data were recorded continuously and digitally stored. Description: During awake state, no clear posterior dominant rhythm was seen.  Sleep was characterized by sleep spindles (12 to 14 Hz), maximal frontocentral region.  EEG showed continuous generalized polymorphic 5 to 6 Hz theta slowing and intermittent 2 to 3 Hz delta slowing. Hyperventilation and photic stimulation were not performed.   ABNORMALITY -Continuous slow, generalized IMPRESSION: This study is  suggestive of moderate diffuse encephalopathy,  nonspecific etiology but likely related to sedation, post-ictal state. No seizures or definite epileptiform discharges were seen throughout the recording. [place]  PLAN Autumm J Milledge 54 y.o. female with medical history significant for ulcerative colitis, depression, hepatitis C, hypertension, stroke, thyroid disease and metastatic neuroendocrine tumor of the lung who presents for evaluation of the metastatic cancer.  After review the labs, the records, discussion with the patient the findings are most consistent with a metastatic neuroendocrine tumor of the lung.  Unfortunately this patient is a very poor candidate for systemic treatment given her numerous comorbidities, prior cocaine use, and noncompliance.    We could consider palliative radiation to either the brain or consider treatment of the spine if that would help to alleviate the patient's pain.  Pain does appear to be her primary concern today, though she does express an interest in available systemic chemotherapies.  She previously declined therapy as would not extend life, however she seems more open to the idea today.  We will order a CT scan of the chest in order to further assess the extent of her disease and determine if there are metastatic lesions in the spine which be amenable to palliative radiation.  In the interim we will work towards pain control, however given her history of polysubstance abuse we may require the help of the pain clinic in order to get this under better control.  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 Agree with prior assessments by Dr. Alen Blew and Dr. Altamease Oiler --recommend referral to neuro-oncology/radiation oncology for consideration of palliative radiation to the  brain. --will obtain CT scan of the chest to determine the extent of disease and also visualize any metastasis causing her back pain. Can consider palliative radiation to these lesions.  --RTC pending completion of CT scan   #Hypokalemia --called in K-dur for patient's K of 2.4 --recommend 28mq daily x 2 weeks with reassessment  #Symptom Management --will prescribe oxycodone 581mPO q 6H for pain control --if ineffective consider referral to pain clinic.   Orders Placed This Encounter  Procedures  . CBC with Differential (Cancer Center Only)    Standing Status:   Future    Number of Occurrences:   1    Standing Expiration Date:   08/03/2021  . CMP ([facility])    Standing Status:   Future    Number of Occurrences:   1    Standing Expiration Date:   08/03/2021  . Lactate dehydrogenase (LDH)    Standing Status:   Future    Number of Occurrences:   1    Standing Expiration Date:   08/03/2021    All questions were answered. The patient knows to call the clinic with any problems, questions or concerns.  A total of more than 40 minutes were spent on this encounter and over half of that time was spent on counseling and coordination of care as outlined above.   JoLedell PeoplesMD Department of Hematology/Oncology CoHolly Pondt WeSouth Jersey Endoscopy LLChone: [phone]: [phone]: [email or web address]  08/08/2020 5:22 PM

## 2020-08-09 ENCOUNTER — Other Ambulatory Visit: Payer: Self-pay | Admitting: *Deleted

## 2020-08-09 ENCOUNTER — Other Ambulatory Visit: Payer: Self-pay | Admitting: Hematology and Oncology

## 2020-08-09 ENCOUNTER — Telehealth: Payer: Self-pay

## 2020-08-09 ENCOUNTER — Telehealth: Payer: Self-pay | Admitting: *Deleted

## 2020-08-09 DIAGNOSIS — C7A8 Other malignant neuroendocrine tumors: Secondary | ICD-10-CM

## 2020-08-09 MED ORDER — OXYCODONE HCL 5 MG PO TABS: 5.0000 mg | ORAL_TABLET | ORAL | 0 refills | Status: DC | PRN

## 2020-08-09 NOTE — Telephone Encounter (Signed)
Referral made to [facility] for symptom management and to manage patient's pain. Also made a referral to [facility] for psycho-social needs, financial needs  Dr. Lorenso Courier to send in prescription for another 30 tabs of oxycodone 5 mg while waiting for Palliative care to manage pt's pain  TCT patient and advised of the above. Emphasized to patient that the prescription for oxycodone needs to last  Until Palliative Care assumes her pain management care.  Pt voiced understanding. Pt very tearful as we discussed Palliative Care vs. Hospice Care. She wants to be around for her family but voices that she has been told that her illness is terminal. Will need ongoing goals of care discussions.

## 2020-08-09 NOTE — Telephone Encounter (Signed)
Spoke with patient's father Carloyn Manner. He states he was not aware of referral. Patient was not at home at the time. Carloyn Manner states he will call back after discussing with the patient to let us know if she would like a Palliative consult scheduled.

## 2020-08-09 NOTE — Telephone Encounter (Signed)
Spoke with patient and have scheduled an In-person Consult for 08/24/20 @  8:30 AM.  COVID screening was negative. No pets in home. Patient lives with father Carloyn Manner.   Consent obtained; updated Outlook/Netsmart/Team List and Epic.

## 2020-08-09 NOTE — Telephone Encounter (Signed)
Received call from patient stating she needed refill of her pain medications. She had called yesterday as well. Pt was prescribed oxycodone 5 mg tablets #30 on 08/03/20 and was out of it by 08/07/20. Advised that it was too early for refill. Pt very scattered in conversation, unable to say exactly where she is having pain.  Dr. Lorenso Courier made aware.   Referral to [facility] made as well as calling [facility] for symptom management.  Awaiting call back from both offices.

## 2020-08-10 ENCOUNTER — Encounter: Payer: Self-pay | Admitting: General Practice

## 2020-08-10 NOTE — Progress Notes (Addendum)
[place] CSW Progress Notes  Call to patient per social work referral for complex psychosocial issues/needs.  Unable to reach patient - mobile # is not a working # at this time, left generic VM on house phone as it was not personally identified.  [facility] - they have received a referral to assist her w a disability application and are currently working w patient to submit.  Per notes, Samuel Jester from [place] has been assigned to work with patient on [place] application.  Per [facility], her Medicaid application has been submitted to DSS and they have 90 days to review and make decision.  Edwyna Shell, LCSW Clinical Social Worker Phone:  [phone]

## 2020-08-14 ENCOUNTER — Telehealth: Payer: Self-pay | Admitting: *Deleted

## 2020-08-14 ENCOUNTER — Encounter: Payer: Self-pay | Admitting: General Practice

## 2020-08-14 NOTE — Progress Notes (Signed)
[facility] CSW Progress Notes  Second attempt to reach patient per referral - unable to reach but left VM w my contact information and encouragement to call back.  Edwyna Shell, LCSW Clinical Social Worker Phone:  [phone]

## 2020-08-14 NOTE — Telephone Encounter (Signed)
Patient called requesting pain medication refill of Oxycodone.    She also questioned if Radiation Oncology would be reaching out to her.  No referral noted on file but do see reference in Dr. Libby Maw note about radonc.   Routed to MD to advise if Radonc referral needs to be placed and if he could review medication refill request.

## 2020-08-15 ENCOUNTER — Telehealth: Payer: Self-pay | Admitting: *Deleted

## 2020-08-15 ENCOUNTER — Encounter: Payer: Self-pay | Admitting: General Practice

## 2020-08-15 NOTE — Telephone Encounter (Signed)
TCT patient to inquire about her appt with Palliative Care and her CT of chest. Spoke with her. Advised that her chest CT scan is scheduled for 08/28/20 @ 3pm, with arrival time of 2:45. She voiced understanding. She states that Palliative Care is scheduled to come on 08/24/20. Advised that I would call Palliative Care to see if they can come sooner as she continues to have concerns with pain management. Call made and they will see if they can move her appt up.  She states she needs a refill of her Oxycodone 5 mg. This was last prescribed on 08/09/20 for #30. Dr. Lorenso Courier made aware of pain med request

## 2020-08-15 NOTE — Progress Notes (Signed)
[facility] CSW Progress Notes  Third call to patient to complete social work assessment, no answer, left VM w my contact information and request to return my call.  Edwyna Shell, LCSW Clinical Social Worker Phone:  [phone]

## 2020-08-17 ENCOUNTER — Other Ambulatory Visit: Payer: Self-pay | Admitting: Hematology and Oncology

## 2020-08-17 ENCOUNTER — Encounter: Payer: Self-pay | Admitting: General Practice

## 2020-08-17 ENCOUNTER — Telehealth: Payer: Self-pay | Admitting: *Deleted

## 2020-08-17 MED ORDER — OXYCODONE HCL 5 MG PO TABS: 5.0000 mg | ORAL_TABLET | ORAL | 0 refills | Status: AC | PRN

## 2020-08-17 NOTE — Progress Notes (Signed)
[facility] CSW Progress Notes  Third call to patient at home and mobile numbers, unable to reach at either.  Will attempt to schedule SW assessment when she is at [facility] if possible as we have been unable to reach by phone.  Closing referral at this time.  Edwyna Shell, LCSW Clinical Social Worker Phone:  [phone]

## 2020-08-17 NOTE — Telephone Encounter (Signed)
Call made to pt an spoke with her. Advised that her pain medication has been sent to her pharmacy.  Also advised that I have not heard back from Palliative care to see if they can see her before 08/24/20. Advised that this prescription needs to last until that date.  Also advised that I will schedule her f/u appt with Dr. Lorenso Courier and get labs on 08/29/20 , the day after her CT chest scan. Provide date and time. Pt voiced undertsanding

## 2020-08-20 ENCOUNTER — Telehealth: Payer: Self-pay | Admitting: Hematology and Oncology

## 2020-08-20 ENCOUNTER — Telehealth: Payer: Self-pay

## 2020-08-20 NOTE — Telephone Encounter (Signed)
Scheduled appt per 11/26 sch msg - unable to reach pt . Left message for patient with appt date and time

## 2020-08-20 NOTE — Telephone Encounter (Signed)
Patient rescheduled from 08/24/20 Shirley's schedule to 08/21/20 @ 2 PM on Queen[facility]'s schedule. Pain management requested a earlier appointment.

## 2020-08-21 ENCOUNTER — Other Ambulatory Visit: Payer: Self-pay | Admitting: Nurse Practitioner

## 2020-08-21 ENCOUNTER — Other Ambulatory Visit: Payer: Self-pay

## 2020-08-21 DIAGNOSIS — Z515 Encounter for palliative care: Secondary | ICD-10-CM

## 2020-08-21 DIAGNOSIS — G893 Neoplasm related pain (acute) (chronic): Secondary | ICD-10-CM

## 2020-08-21 NOTE — Progress Notes (Addendum)
[facility] Consult Note Telephone: [phone]  Fax: [phone]  PATIENT NAME: Holly Weber [address] [place] [address] [phone] (home)  DOB: 03-19-1966 MRN: [id number]  PRIMARY CARE PROVIDER:    Patient, No Pcp Per,  No address on file None  REFERRING PROVIDER:   Orson Slick, MD [address],   [address] [phone]  RESPONSIBLE PARTY:   Extended Emergency Contact Information Primary Emergency Contact: Chavous,Roy Address: [address]          [place] [address] [place] Phone: [phone] Relation: Father  I met face to face with patient and her father in home..   ASSESSMENT AND RECOMMENDATIONS:   Advance Care Planning: Today's visit consisted of building trust and discussions on palliative care medicine as a specialized medical care for people living with serious illness, aimed at facilitating improved quality of life through symptoms relief, assisting with advance care planning and establishing goals of care. Patient expressed appreciation for education provided on palliative care and how it differs from Hospice care service. Patient became teary stating she was told she has 6 weeks to live and she thought todays's visit was to sign her up for Hospice care. Patient verbalized that she is not ready to die now as she has her family to live for. Patient made aware that her prognosis is poor but she would not be signed up for Hospice care without her consent. Patient verbalized not knowing what to do, cried intermittently, she later said she could change her mind about Hospice care if she is deemed not a good candidate for cancer treatment, saying she was told that treatment may not produce good outcome for her, may make her sicker. It was explained to patient that goal of care will be regularly reviewed to ensure that it reflects her wishes. Patient and family open to  further discussion on Hospice care and possible changes after repeat CT scan and appointment with oncology on 08/29/2020.  Goal of care: Patient's goal of care is function. Patient expressed desire to fight for her life, saying "I just want to make my family happy, I will do treatment if I qualify for one but the doctors think it will not do me any good. I know treatment will not help me- I think's too late". Validation and emotional support provided. Directives: Patient verbalized desire to be resuscitated in the event of cardiac or respiratory arrest, saying she wants fight for her life. Patient's father present during visit, said he was intubated in the past and survived it. Patient made aware that having a metastatic disease makes her situation different as her lung is compromised. The need to complete a MOST form was discussed, patient and her father expressed interest in completing a MOST form. Discussed and reviewed sections of the form in detail, opportunity for questions given, all questions answered. Form completed and signed with patient and her father, signed form given to her father to keep and take to medical appointments, copy of MOST form uploaded to [facility] EMR. Details of MOST include Attempt Resuscitation (CPR), full scope of treatment, antibiotics if indicated, IV fluids if indicated, feeding tube long-term if indicated. Palliative care will continue to provide support to patient, family and the medical team.  Symptom Management:  Pain: Patient report ongoing uncontrolled pain. Report current pain as 7/10. Report pain is all over her body worse in her upper body, from her lower  abdomen up to chest and back. Described pain as deep sharp pain. She report not sleeping at night as the pain keeps her up. Oxycodone 61m was prescribed by her oncologist Dr. DLorenso Courier 30 tablets was dispensed on 08/17/2020, patient report she took the last dose today. Patient requesting refill of pain med,  report she is needing the pain medication every 4 hours as she is constantly in pain. She denied nausea, denied vomiting, report good appetite. She report trying to smoke Marijuana to help relieve her pain, she report stopping because it burned her throat. Patient report taking Tylenol 6572mtwo three times a day as needed for her pain without relief.  Recommendation: Recommend reordering patient's Oxycodone 54m51mvery 4hrs as needed for severe pain until her visit with Dr. DorLorenso Courieronsider referral to pain management clinic if patient decides to pursue treatment if offered as patient has history of recreational drug use. If patient elects Hospice care, Hospice would take over her pain management. Patient may take Tylenol 650m43mery 4-6hrs as needed for moderate pain with caution to not take more than 3000mg53ma 24hrs period. Psychosocial concerns: Patient report not having health  insurance since release from prison. Report inability to afford her medications as she has no income, patient in the process of getting emergency medicaid and reestablishing her disability payments. Patient is being followed by [facility], issues with difficulty reaching patient on phone. Discussed with patient of possibility of using good Rx App to get discount for her medications. Patient endorsed she will try using the App. Made aware the importance of taking her medications to prevent worsening of her chronic disease. She report getting her medications for free when she was in jail. addeddum 08/22/2020 Prescription for Oxycodone 54mg, 18me one tablet by mouth every 4hrs as needed for pain sent to patient's pharmacy. #30 tablets. ([facility] on [address])  Follow up Palliative Care Visit: Palliative care will continue to follow for goals of care clarification and symptom management. Return in about 2 weeks or prn to review decision after repeat CT scan.  Family /Caregiver/Community Supports: Patient currently lives  at home with her father who is her main care giver. She was released early from prison on 07/11/2020 for medical leave related to her cancer diagnosis, she report she has been in prison for 17 years. Patient report having 2 siblings and 7 children.   Cognitive / Functional decline: Patient awake and alert, she is coherent, she however report occasion of forgetfulness. Her words were scartered at times during the visit, cried intermittently, appeared anxious. Patient able to complete her ADLs with minimal assist. Has some activity intolenrance, report SOB with minimal activity like walking to mail box or cooking. No report of recent fall, walks without assistive device.  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 [facility], was said to be lost to follow up afterwards. Patient is an active cigarret smoker, report last recreational drug use was when released from prison denied active use. Patient was hospitalized from 07/17/2020-07/18/2020 for flare up of her ulcerative colitis, she report occasional diarhea. Palliative Care was asked to follow this patient  by consultation request of Orson Slick, MD to help address advance care planning and goals of care and  symptoms management. This is an initial visit.  CODE STATUS: Full Code  PPS: 60%  HOSPICE ELIGIBILITY/DIAGNOSIS: TBD  PHYSICAL EXAM / ROS:   Current and past weights: 168lbs, Ht 21f5", BMI 28.16kg.m2 on 08/03/2020 General: NAD, frail appearing, cooperative, report fatigue Cardiovascular: no chest pain reported, no edema  Pulmonary: chronic cough, no  increased SOB, oxygen saturation 98% on room air GI: appetite fair, denied constipation, continent of bowel GU: denies dysuria, continent of urine MSK:  no joint and ROM abnormalities, ambulatory Skin: no rashes or wounds reported or noted on exposed skin Neurological: Weakness, but otherwise nonfocal  I spent 80 minutes providing this consultation; time includes time spent with patient and family, chart review, provider coordination, and documentation. More than 50% of the time in this consultation was spent on counseling and coordinating communication  PAST MEDICAL HISTORY:  Past Medical History:  Diagnosis Date  . [place] ([facility]   . Colitis, ulcerative ([facility]   . Depression   . Hepatitis C   . Hypertension   . Stroke ([facility]   . Thyroid disease     SOCIAL HX:  Social History   Tobacco Use  . Smoking status: Current Every Day Smoker    Packs/day: 1.50    Types: Cigarettes  . Smokeless tobacco: Never Used  Substance Use Topics  . Alcohol use: Yes    Comment: Daily    FAMILY HX:  Family History  Problem Relation Age of Onset  . Hypertension Mother   . Hypertension Father     ALLERGIES:  Allergies  Allergen Reactions  . Asa [Aspirin] Other (See Comments)    Ulcerative colitis flares.  . Nsaids Other (See Comments)    Ulcerative colitis flares  . Penicillins Rash and Other (See Comments)    Has patient had a PCN reaction causing immediate rash, facial/tongue/throat swelling, SOB or lightheadedness with hypotension:unsure Has patient had a PCN reaction causing severe rash involving mucus membranes or skin necrosis:unsure Has patient had a PCN reaction that required hospitalization:No Has patient had a PCN reaction occurring within the last 10 years:Yes If all of the above answers are "NO", then may proceed with Cephalosporin use.      PERTINENT MEDICATIONS:  Outpatient Encounter Medications as of 08/21/2020  Medication Sig  . levETIRAcetam (KEPPRA) 500 MG tablet Take  1 tablet (500 mg total) by mouth 2 (two) times daily.  .Marland KitchenLORazepam (ATIVAN) 0.5 MG tablet Take 1 tablet (0.5 mg total) by mouth every 4 (four) hours as needed for anxiety. May crush, mix with water and give sublingually if needed.  . mesalamine (LIALDA) 1.2 g EC tablet Take 2 tablets (2.4 g total) by mouth daily with breakfast.  . methimazole (TAPAZOLE) 10 MG tablet Take 2 tablets (20 mg total) by mouth daily.  . ondansetron (ZOFRAN) 8 MG tablet Take 1 tablet (8 mg total) by mouth every 8 (eight) hours as needed for nausea or vomiting.  .Marland KitchenoxyCODONE (OXY IR/ROXICODONE) 5 MG immediate release tablet Take 1 tablet (5 mg total) by mouth every 4 (four) hours as needed for severe pain.  . potassium chloride SA (KLOR-CON) 20 MEQ tablet Take 1 tablet (20 mEq total) by mouth 2 (two) times daily.  . prochlorperazine (COMPAZINE) 10 MG tablet Take 1 tablet (10 mg total) by mouth every 4 (four) hours as needed for nausea or vomiting. May crush, mix with  water and give sublingually.  . propranolol (INDERAL) 20 MG tablet Take 1 tablet (20 mg total) by mouth 2 (two) times daily.   No facility-administered encounter medications on file as of 08/21/2020.     Jari Favre, DNP, AGPCNP-BC

## 2020-08-24 ENCOUNTER — Other Ambulatory Visit: Payer: Self-pay | Admitting: Internal Medicine

## 2020-08-28 ENCOUNTER — Ambulatory Visit (HOSPITAL_COMMUNITY): Payer: Self-pay

## 2020-08-29 ENCOUNTER — Other Ambulatory Visit: Payer: Self-pay | Admitting: *Deleted

## 2020-08-29 ENCOUNTER — Inpatient Hospital Stay: Payer: Self-pay | Attending: Hematology and Oncology | Admitting: Hematology and Oncology

## 2020-08-29 ENCOUNTER — Telehealth: Payer: Self-pay | Admitting: *Deleted

## 2020-08-29 ENCOUNTER — Inpatient Hospital Stay: Payer: Self-pay

## 2020-08-29 ENCOUNTER — Encounter: Payer: Self-pay | Admitting: Hematology and Oncology

## 2020-08-29 ENCOUNTER — Other Ambulatory Visit: Payer: Self-pay

## 2020-08-29 VITALS — BP 181/79 | HR 116 | Temp 97.6°F | Resp 18 | Ht 65.0 in | Wt 163.1 lb

## 2020-08-29 DIAGNOSIS — C7B8 Other secondary neuroendocrine tumors: Secondary | ICD-10-CM | POA: Insufficient documentation

## 2020-08-29 DIAGNOSIS — K529 Noninfective gastroenteritis and colitis, unspecified: Secondary | ICD-10-CM

## 2020-08-29 DIAGNOSIS — F1490 Cocaine use, unspecified, uncomplicated: Secondary | ICD-10-CM | POA: Insufficient documentation

## 2020-08-29 DIAGNOSIS — C7A8 Other malignant neuroendocrine tumors: Secondary | ICD-10-CM

## 2020-08-29 DIAGNOSIS — Z8673 Personal history of transient ischemic attack (TIA), and cerebral infarction without residual deficits: Secondary | ICD-10-CM | POA: Insufficient documentation

## 2020-08-29 DIAGNOSIS — E876 Hypokalemia: Secondary | ICD-10-CM | POA: Insufficient documentation

## 2020-08-29 DIAGNOSIS — Z9119 Patient's noncompliance with other medical treatment and regimen: Secondary | ICD-10-CM | POA: Insufficient documentation

## 2020-08-29 DIAGNOSIS — F17210 Nicotine dependence, cigarettes, uncomplicated: Secondary | ICD-10-CM | POA: Insufficient documentation

## 2020-08-29 DIAGNOSIS — C3490 Malignant neoplasm of unspecified part of unspecified bronchus or lung: Secondary | ICD-10-CM

## 2020-08-29 DIAGNOSIS — F1910 Other psychoactive substance abuse, uncomplicated: Secondary | ICD-10-CM

## 2020-08-29 DIAGNOSIS — F1290 Cannabis use, unspecified, uncomplicated: Secondary | ICD-10-CM | POA: Insufficient documentation

## 2020-08-29 LAB — CBC WITH DIFFERENTIAL (CANCER CENTER ONLY)
Abs Immature Granulocytes: 0 10*3/uL (ref 0.00–0.07)
Basophils Absolute: 0 10*3/uL (ref 0.0–0.1)
Basophils Relative: 1 %
Eosinophils Absolute: 0.1 10*3/uL (ref 0.0–0.5)
Eosinophils Relative: 2 %
HCT: 45 % (ref 36.0–46.0)
Hemoglobin: 14.2 g/dL (ref 12.0–15.0)
Immature Granulocytes: 0 %
Lymphocytes Relative: 34 %
Lymphs Abs: 1.3 10*3/uL (ref 0.7–4.0)
MCH: 25.8 pg — ABNORMAL LOW (ref 26.0–34.0)
MCHC: 31.6 g/dL (ref 30.0–36.0)
MCV: 81.7 fL (ref 80.0–100.0)
Monocytes Absolute: 0.4 10*3/uL (ref 0.1–1.0)
Monocytes Relative: 10 %
Neutro Abs: 2 10*3/uL (ref 1.7–7.7)
Neutrophils Relative %: 53 %
Platelet Count: 203 10*3/uL (ref 150–400)
RBC: 5.51 MIL/uL — ABNORMAL HIGH (ref 3.87–5.11)
RDW: 13.5 % (ref 11.5–15.5)
WBC Count: 3.8 10*3/uL — ABNORMAL LOW (ref 4.0–10.5)
nRBC: 0 % (ref 0.0–0.2)

## 2020-08-29 LAB — CMP (CANCER CENTER ONLY)
ALT: 12 U/L (ref 0–44)
AST: 24 U/L (ref 15–41)
Albumin: 3.3 g/dL — ABNORMAL LOW (ref 3.5–5.0)
Alkaline Phosphatase: 139 U/L — ABNORMAL HIGH (ref 38–126)
Anion gap: 9 (ref 5–15)
BUN: 6 mg/dL (ref 6–20)
CO2: 25 mmol/L (ref 22–32)
Calcium: 9.1 mg/dL (ref 8.9–10.3)
Chloride: 106 mmol/L (ref 98–111)
Creatinine: 0.86 mg/dL (ref 0.44–1.00)
GFR, Estimated: >60 mL/min (ref >60)
Glucose, Bld: 196 mg/dL — ABNORMAL HIGH (ref 70–99)
Potassium: 3.6 mmol/L (ref 3.5–5.1)
Sodium: 140 mmol/L (ref 135–145)
Total Bilirubin: 0.7 mg/dL (ref 0.3–1.2)
Total Protein: 7.5 g/dL (ref 6.5–8.1)

## 2020-08-29 NOTE — Progress Notes (Signed)
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 CTA chest showed extensive mediastinal and hilar as well as lower neck lymphadenopathy and she was found to have 2 masses in the right lung, one associated with the pleura and one more parenchymal 2) 04/09/2020: underwent an ultrasound-guided biopsy of the left supraclavicular lymph node and pathology was consistent with metastatic carcinoma with neuroendocrine features, Ki-67 60%, TTF-1 positive. MRI brain suspicious for metastatic disease.  3) 04/14/2020: received 1 cycle of carboplatin/etoposide. She was advised to follow-up in [facility] with medical and radiation oncology. Is not clear if she followed up as advised. 4) 07/11/2020: released from prison early on medical leave  5) 05/24/2020: patient seen by Dr. Altamease Oiler at [facility]. At this time she noted she did not have interest in pursuing further chemotherapy.  6) 07/17/2020-07/18/2020: admitted to [facility] for UC flare 7) 08/03/2020: establish care with Dr. Lorenso Courier   Interval History:  Holly Weber 54 y.o. female with medical history significant for metastatic neuroendocrine tumor of the lung who presents for a follow up visit. The patient's last visit was on 08/03/2020. In the interim since the last visit she was scheduled for a CT scan but failed to go to the appointment.   On exam today Holly Weber is accompanied by her father.  She reports that she continues to have pain "everywhere".  But when pinned down she notes that it is "in the front and in the back".  She notes that she does have some issues with shortness of breath and does have a cough which  produces clear sputum or mucus.  She notes there is not any color or blood in her sputum.  She notes that her appetite has been "okay" though she is unsure why she has lost 6 pounds with a weight.  She denies having any issues with fevers, chills, sweats, nausea, vomiting or diarrhea.  She notes that her bowel movements are "okay".  She does endorse having memory issues and some occasional headaches but no changes in vision.  Today we reemphasized the importance of undergoing the CT scan of the chest so that we could visualize her spine and see if there is metastatic disease they are causing her symptoms.  She did voice her understanding of this.  We also prescribed her oxycodone 5 mg x 30 pills and have had to refill this once.  She tends to go through these in approximately 3 days time.  I noted that this is excessive and it is unsafe for [place] to proceed with prescribing opioids without knowing exactly what pain we are treating.  She was not happy with this decision but her father did appear to understand our concern with prescribing opioids without a clear indication.  A full 10 point ROS is listed below.  MEDICAL HISTORY:  Past Medical History:  Diagnosis Date  . Cancer ([place])   . Colitis, ulcerative ([place])   . Depression   . Hepatitis C   . Hypertension   . Stroke ([place])   . Thyroid disease     SURGICAL HISTORY: Past Surgical History:  Procedure Laterality Date  . ARM WOUND REPAIR / CLOSURE    . COLONOSCOPY    . OPEN  REDUCTION INTERNAL FIXATION (ORIF) DISTAL RADIAL FRACTURE Left 01/07/2018   Procedure: OPEN REDUCTION INTERNAL FIXATION (ORIF) DISTAL RADIAL FRACTURE;  Surgeon: Leanora Cover, MD;  Location: [place];  Service: Orthopedics;  Laterality: Left;  . TEE WITHOUT CARDIOVERSION N/A 12/17/2016   Procedure: TRANSESOPHAGEAL ECHOCARDIOGRAM (TEE);  Surgeon: Josue Hector, MD;  Location: [facility] ENDOSCOPY;  Service: Cardiovascular;  Laterality: N/A;  . TONSILLECTOMY      SOCIAL  HISTORY: Social History   Socioeconomic History  . Marital status: Widowed    Spouse name: Not on file  . Number of children: Not on file  . Years of education: Not on file  . Highest education level: Not on file  Occupational History  . Not on file  Tobacco Use  . Smoking status: Current Every Day Smoker    Packs/day: 1.50    Types: Cigarettes  . Smokeless tobacco: Never Used  Substance and Sexual Activity  . Alcohol use: Yes    Comment: Daily   . Drug use: Yes    Types: Cocaine, Marijuana    Comment: Daily on both  . Sexual activity: Yes    Birth control/protection: None  Other Topics Concern  . Not on file  Social History Narrative  . Not on file   Social Determinants of Health   Financial Resource Strain:   . Difficulty of Paying Living Expenses: Not on file  Food Insecurity:   . Worried About Charity fundraiser in the Last Year: Not on file  . Ran Out of Food in the Last Year: Not on file  Transportation Needs:   . Lack of Transportation (Medical): Not on file  . Lack of Transportation (Non-Medical): Not on file  Physical Activity:   . Days of Exercise per Week: Not on file  . Minutes of Exercise per Session: Not on file  Stress:   . Feeling of Stress : Not on file  Social Connections:   . Frequency of Communication with Friends and Family: Not on file  . Frequency of Social Gatherings with Friends and Family: Not on file  . Attends Religious Services: Not on file  . Active Member of Clubs or Organizations: Not on file  . Attends Archivist Meetings: Not on file  . Marital Status: Not on file  Intimate Partner Violence:   . Fear of Current or Ex-Partner: Not on file  . Emotionally Abused: Not on file  . Physically Abused: Not on file  . Sexually Abused: Not on file    FAMILY HISTORY: Family History  Problem Relation Age of Onset  . Hypertension Mother   . Hypertension Father     ALLERGIES:  is allergic to asa [aspirin], nsaids, and  penicillins.  MEDICATIONS:  Current Outpatient Medications  Medication Sig Dispense Refill  . levETIRAcetam (KEPPRA) 500 MG tablet Take 1 tablet (500 mg total) by mouth 2 (two) times daily. 60 tablet 2  . LORazepam (ATIVAN) 0.5 MG tablet Take 1 tablet (0.5 mg total) by mouth every 4 (four) hours as needed for anxiety. May crush, mix with water and give sublingually if needed. 42 tablet 0  . mesalamine (LIALDA) 1.2 g EC tablet Take 2 tablets (2.4 g total) by mouth daily with breakfast. (Patient not taking: Reported on 08/29/2020) 180 tablet 0  . methimazole (TAPAZOLE) 10 MG tablet Take 2 tablets (20 mg total) by mouth daily. 180 tablet 0  . ondansetron (ZOFRAN) 8 MG tablet Take 1 tablet (8 mg total) by mouth every  8 (eight) hours as needed for nausea or vomiting. 30 tablet 0  . oxyCODONE (OXY IR/ROXICODONE) 5 MG immediate release tablet Take 1 tablet (5 mg total) by mouth every 4 (four) hours as needed for severe pain. 30 tablet 0  . potassium chloride SA (KLOR-CON) 20 MEQ tablet Take 1 tablet (20 mEq total) by mouth 2 (two) times daily. (Patient not taking: Reported on 08/29/2020) 28 tablet 0  . prochlorperazine (COMPAZINE) 10 MG tablet Take 1 tablet (10 mg total) by mouth every 4 (four) hours as needed for nausea or vomiting. May crush, mix with water and give sublingually. 42 tablet 0  . propranolol (INDERAL) 20 MG tablet Take 1 tablet (20 mg total) by mouth 2 (two) times daily. 180 tablet 0   No current facility-administered medications for this visit.    REVIEW OF SYSTEMS:   Constitutional: ( - ) fevers, ( - )  chills , ( - ) night sweats Eyes: ( - ) blurriness of vision, ( - ) double vision, ( - ) watery eyes Ears, nose, mouth, throat, and face: ( - ) mucositis, ( - ) sore throat Respiratory: ( - ) cough, ( - ) dyspnea, ( - ) wheezes Cardiovascular: ( - ) palpitation, ( - ) chest discomfort, ( - ) lower extremity swelling Gastrointestinal:  ( - ) nausea, ( - ) heartburn, ( - ) change in  bowel habits Skin: ( - ) abnormal skin rashes Lymphatics: ( - ) new lymphadenopathy, ( - ) easy bruising Neurological: ( - ) numbness, ( - ) tingling, ( - ) new weaknesses Behavioral/Psych: ( - ) mood change, ( - ) new changes  All other systems were reviewed with the patient and are negative.  PHYSICAL EXAMINATION: ECOG PERFORMANCE STATUS: 2 - Symptomatic, <50% confined to bed  Vitals:   08/29/20 1310  BP: (!) 181/79  Pulse: (!) 116  Resp: 18  Temp: 97.6 F (36.4 C)  SpO2: 97%   Filed Weights   08/29/20 1310  Weight: 163 lb 1.6 oz (74 kg)    GENERAL: chronically ill appearing middle aged female, appears agitated.   SKIN: skin color, texture, turgor are normal, no rashes or significant lesions EYES: conjunctiva are pink and non-injected, sclera clear LUNGS: clear to auscultation and percussion with normal breathing effort HEART: regular rate & rhythm and no murmurs and no lower extremity edema Musculoskeletal: no cyanosis of digits and no clubbing  PSYCH: distractible but redirectable alert & oriented x 3, fluent speech NEURO: no focal motor/sensory deficits  LABORATORY DATA:  I have reviewed the data as listed CBC Latest Ref Rng & Units 08/29/2020 08/03/2020 07/18/2020  WBC 4.0 - 10.5 K/uL 3.8(L) 4.9 11.2(H)  Hemoglobin 12.0 - 15.0 g/dL 14.2 12.5 13.5  Hematocrit 36 - 46 % 45.0 37.5 43.2  Platelets 150 - 400 K/uL 203 230 177    CMP Latest Ref Rng & Units 08/29/2020 08/03/2020 07/18/2020  Glucose 70 - 99 mg/dL 196(H) 178(H) 258(H)  BUN 6 - 20 mg/dL 6 7 5(L)  Creatinine 0.44 - 1.00 mg/dL 0.86 0.63 0.51  Sodium 135 - 145 mmol/L 140 140 136  Potassium 3.5 - 5.1 mmol/L 3.6 2.4(LL) 3.8  Chloride 98 - 111 mmol/L 106 105 105  CO2 22 - 32 mmol/L 25 26 22   Calcium 8.9 - 10.3 mg/dL 9.1 8.6(L) 8.9  Total Protein 6.5 - 8.1 g/dL 7.5 7.0 -  Total Bilirubin 0.3 - 1.2 mg/dL 0.7 0.9 -  Alkaline Phos 38 - 126  U/L 139(H) 171(H) -  AST 15 - 41 U/L 24 38 -  ALT 0 - 44 U/L 12 222(H) -     RADIOGRAPHIC STUDIES: No results found.  ASSESSMENT & PLAN Holly Weber 54 y.o. female with medical history significant for metastatic neuroendocrine tumor of the lung who presents for a follow up visit. After review the labs, the records, discussion with the patient the findings are most consistent with a metastatic neuroendocrine tumor of the lung.  Unfortunately this patient is a very poor candidate for systemic treatment given her numerous comorbidities, prior cocaine use, and noncompliance.    We could consider palliative radiation to either the brain or consider treatment of the spine if that would help to alleviate the patient's pain.  Pain does appear to be her primary concern today, though she does express an interest in available systemic chemotherapies.  She previously declined therapy as would not extend life, however she seems more open to the idea today.  We have ordered a CT scan of the chest in order to further assess the extent of her disease and determine if there are metastatic lesions in the spine which be amenable to palliative radiation.  In the interim we will work towards pain control, however given her history of polysubstance abuse we may require the help of the pain clinic in order to get this under better control.  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 Agree with prior assessments by Dr. Alen Blew and Dr. Altamease Oiler --consider referral to neuro-oncology/radiation oncology for consideration of palliative radiation to the brain --will obtain CT scan of the chest to determine the extent of disease and also visualize any metastasis causing her back pain. Can consider palliative radiation to these lesions.  --RTC pending completion of CT scan    #Hypokalemia, improved --called in K-dur for patient's K of 2.4, though she did not pick this up --K appears to be normalizing without intervention.   #Symptom Management --prescribde oxycodone 73m PO q 6H x 30 pills for pain control. Refilled this x 1 after patient depleted this in 3 days. --hold on opioid therapy until CT chest is performed. We need to understand what is causing her pain before we can continue markedly high doses of opioid medication.  --attempted referral to pain clinic but patient is without insurance. Palliative care declined to provide pain medications.    No orders of the defined types were placed in this encounter.   All questions were answered. The patient knows to call the clinic with any problems, questions or concerns.  A total of more than 30 minutes were spent on this encounter and over half of that time was spent on counseling and coordination of care as outlined above.   JLedell Peoples MD Department of Hematology/Oncology [place] DayPhone: [phone]: [phone]: [email or web address]  08/29/2020 2:53 PM

## 2020-08-29 NOTE — Telephone Encounter (Signed)
Attempted to call patient on her mobile # as well as her home #. No answer to either. Left vm message for pt to call back asap.to [phone]  Pt did not show for her CT scan yesterday, 08/28/20 This was re-scheduled for 08/31/20 @4pm  with arrival time of 3:45 pm at [facility].   Pt's appt with Dr. Lorenso Courier needs to be re-scheduled from today to after her CT scan.  Scheduling message sent for 09/05/20

## 2020-08-30 ENCOUNTER — Telehealth: Payer: Self-pay | Admitting: Hematology and Oncology

## 2020-08-30 NOTE — Telephone Encounter (Signed)
Scheduled appt per 12/8 sch msg - pt father is aware of appt.

## 2020-08-31 ENCOUNTER — Ambulatory Visit (HOSPITAL_COMMUNITY): Payer: Self-pay

## 2020-09-03 ENCOUNTER — Telehealth: Payer: Self-pay | Admitting: *Deleted

## 2020-09-03 NOTE — Telephone Encounter (Signed)
TCT patient's father again. No answer but was able to leave vm message for him.. Advised that pt has an appt with Dr. Lorenso Courier on 09/05/20 which we will be cancelling as she has not had her CT scan yet. Advised to call back @ [phone] to get scan and MD appt re-scheduled.

## 2020-09-03 NOTE — Telephone Encounter (Signed)
TCT patient's father, Shaya Reddick regarding pt's CT scan appt. Pt did not show for her scan for the 2nd time this past Friday.  Patient and her father were very aware of the need for pt to get this scan as it was discussed at her last appt with Dr. Lorenso Courier.   No answer but was able to leave a message for pt's father to call back about rescheduling this scan.  Dr. Lorenso Courier aware of scan not being done

## 2020-09-03 NOTE — Telephone Encounter (Signed)
Received vm message from [facility] in radiology to advise that patient did not show up for her CT scan on Friday, 08/31/20  Dr. Lorenso Courier made aware.

## 2020-09-04 ENCOUNTER — Telehealth: Payer: Self-pay | Admitting: *Deleted

## 2020-09-04 NOTE — Telephone Encounter (Signed)
Patient called to let us know that she rescheduled her CT for 09/13/2020.  She wanted to get an appointment first available after CT to see Dr. Lorenso Courier.  She also requested additional pain medication.   Routed to MD to make aware.  Scheduling request sent to add appointment.

## 2020-09-05 ENCOUNTER — Telehealth: Payer: Self-pay | Admitting: Hematology and Oncology

## 2020-09-05 ENCOUNTER — Inpatient Hospital Stay: Payer: Self-pay

## 2020-09-05 ENCOUNTER — Inpatient Hospital Stay: Payer: Self-pay | Admitting: Hematology and Oncology

## 2020-09-05 NOTE — Telephone Encounter (Signed)
Called pt per 12/14 sch msg - pt is aware of appt date and time

## 2020-09-13 ENCOUNTER — Other Ambulatory Visit: Payer: Self-pay

## 2020-09-13 ENCOUNTER — Ambulatory Visit (HOSPITAL_COMMUNITY)
Admission: RE | Admit: 2020-09-13 | Discharge: 2020-09-13 | Disposition: A | Discharge status: Home / Self Care | Payer: Self-pay | Source: Ambulatory Visit | Attending: Hematology and Oncology | Admitting: Hematology and Oncology

## 2020-09-13 DIAGNOSIS — C3490 Malignant neoplasm of unspecified part of unspecified bronchus or lung: Secondary | ICD-10-CM | POA: Insufficient documentation

## 2020-09-13 MED ORDER — HEPARIN SOD (PORK) LOCK FLUSH 100 UNIT/ML IV SOLN
INTRAVENOUS | Status: AC
  Administered 2020-09-13: 17:00:00 500 [IU] via INTRAVENOUS
  Filled 2020-09-13: qty 5

## 2020-09-13 MED ORDER — IOHEXOL 300 MG/ML  SOLN
75.0000 mL | Freq: Once | INTRAMUSCULAR | Status: AC | PRN
  Administered 2020-09-13: 75 mL via INTRAVENOUS

## 2020-09-13 MED ORDER — HEPARIN SOD (PORK) LOCK FLUSH 100 UNIT/ML IV SOLN: 500.0000 [IU] | Freq: Once | INTRAVENOUS | Status: AC

## 2020-09-17 ENCOUNTER — Telehealth: Payer: Self-pay | Admitting: *Deleted

## 2020-09-17 NOTE — Telephone Encounter (Signed)
-----   Message from [place], MD sent at 09/14/2020  2:05 PM EST ----- There is no evidence on CT scan of bone involvement or clear cause of her nonspecific back pain. As such I do not believe that opioid therapy is appropriate. Additionally I do not believe her to be a good treatment candidate (given her comorbidities, noncompliance, and drug usage) . If she would like, we could make a referral to another cancer center [facility] or a return to [facility]) for a second opinion regarding care. Otherwise I would recommend comfort based care alone.   ----- Message ----- From: Interface, Rad Results In Sent: 09/14/2020   5:48 AM EST To: Orson Slick, MD

## 2020-09-17 NOTE — Telephone Encounter (Signed)
TCT patient  And spoke with her. Advised that there is no cancer in her spine and no clear cause for her non-specific back pain. Advised that because of that, he cannot prescribe narcotics for her non-specific pain.  He also advised that she is not a good candidate for treatment to her co-morbidities , non-compliance and drug use. He advised to continue with palliative care or full hospice services. Advised that she could be referred to another cancer center [facility] and back to [facility] if she wanted.  Dr. Epimenio Sarin recommends comfort measures alone. Pt voiced understanding and will discuss with her father and thenlet [place] know what she would like to do.

## 2020-09-18 ENCOUNTER — Telehealth: Payer: Self-pay | Admitting: *Deleted

## 2020-09-18 NOTE — Telephone Encounter (Signed)
Patient's father called, LVM requesting results of recent CT scan. Note in chart from 12/27 states patient was given the information and MD recommendations. Contacted father -  recommended he contact daughter. He verbalized understanding.

## 2020-09-26 ENCOUNTER — Telehealth: Payer: Self-pay | Admitting: *Deleted

## 2020-09-26 ENCOUNTER — Inpatient Hospital Stay: Payer: Self-pay | Admitting: General Practice

## 2020-09-26 ENCOUNTER — Inpatient Hospital Stay: Payer: Self-pay | Admitting: Hematology and Oncology

## 2020-09-26 ENCOUNTER — Inpatient Hospital Stay: Payer: Self-pay

## 2020-09-26 NOTE — Telephone Encounter (Signed)
Received call from patient. She is calling to see if she really needed to come for her appt today.  Advised that as she will not be getting any chemotherapy and she is just comfort  measures and hopefully hospice services.  Advised that she can chose not to come for her appts.  She states she does not want to come.  She is asking if I could talk to her father about her scan and the plan after.  TCT patient's father and spoke ti him. Advised that his daughter is not a candidate for any further chemo and that her best option would be for Hospice services. He asked about prognosis. Advised that it was difficult to pin point but her disease is progressive and ultimately will take her life. He voiced understanding and will discuss Hospice with Joanmarie.  appt cancelled per pt request.  Dr. Lorenso Courier aware.

## 2021-01-16 ENCOUNTER — Telehealth: Payer: Self-pay | Admitting: *Deleted

## 2021-01-16 NOTE — Telephone Encounter (Signed)
Received call from Jiles Harold, RN with [facility]. She states they cannot admit patient for Hospice services as patient is actively using street drugs and that her father may not want to take care of her at the end of life.  Referral made to [facility] instead.

## 2021-01-28 ENCOUNTER — Telehealth: Payer: Self-pay | Admitting: *Deleted

## 2021-01-28 NOTE — Telephone Encounter (Signed)
Received notification from [facility] that patient has been admitted to hospice.

## 2021-07-04 ENCOUNTER — Telehealth: Payer: Self-pay | Admitting: *Deleted

## 2021-07-04 NOTE — Telephone Encounter (Signed)
Hospice orders signed by Dr. Lorenso Courier faxed to [facility] 07/03/21.  Fax confirmation received.

## 2021-08-27 IMAGING — CT CT CHEST W/ CM
2 of 4 series · 15 of 36 positions shown, 18 images · IV contrast (omnipaque)
Comparison: None.

CLINICAL DATA: Non-small-cell lung cancer.  Staging.

EXAM:
CT CHEST WITH CONTRAST
TECHNIQUE: Multidetector CT imaging of the chest was performed during
intravenous contrast administration.
CONTRAST:  75mL OMNIPAQUE IOHEXOL 300 MG/ML  SOLN

[Series 2: axial st · axial · 0.67mm/px · z∈[-302,-54]mm · 12 of 148 slices shown, 15 images]
[im 12/148  mediastinal]
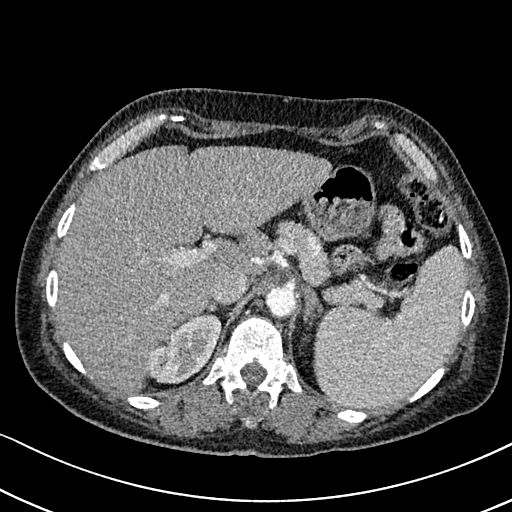
[im 12/148  lung]
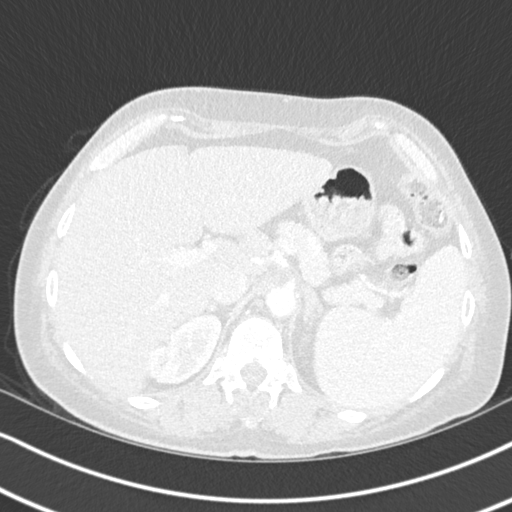
[im 23/148  lung]
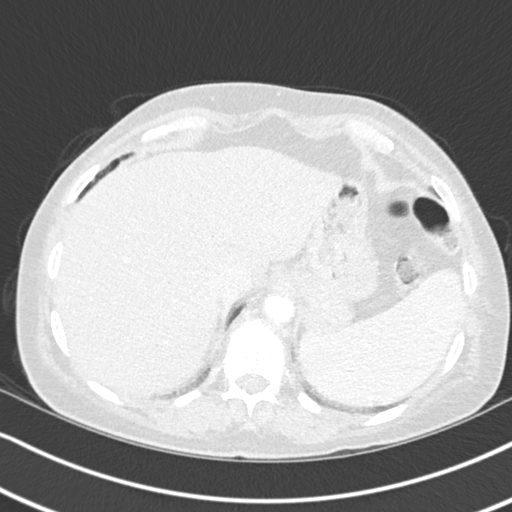
[im 34/148  lung]
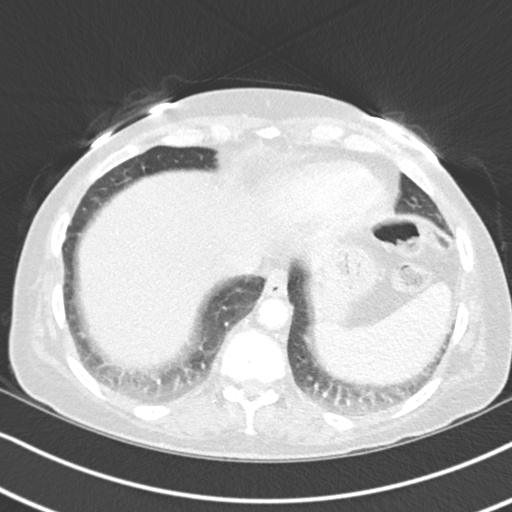
[im 46/148  lung]
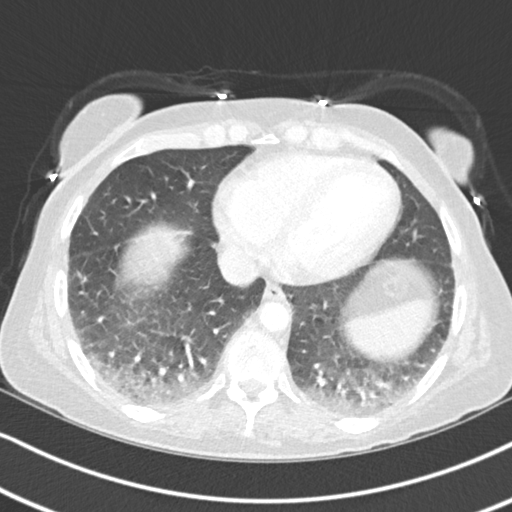
[im 57/148  mediastinal]
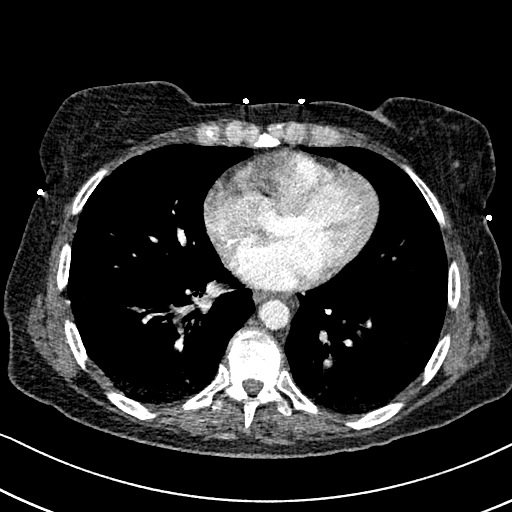
[im 57/148  lung]
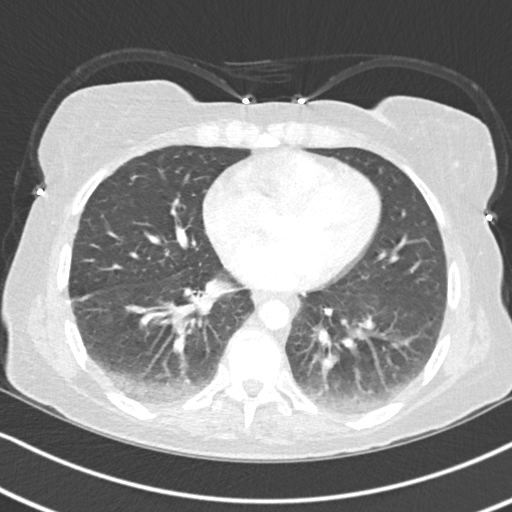
[im 68/148  lung]
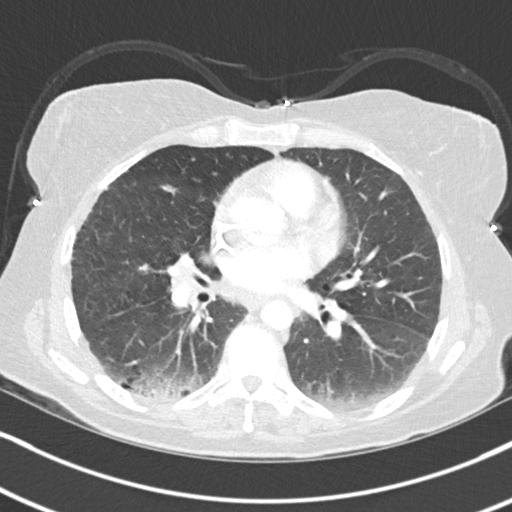
[im 80/148  lung]
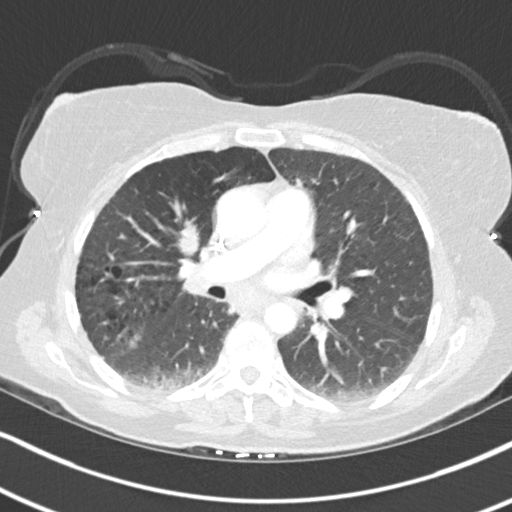
[im 91/148  lung]
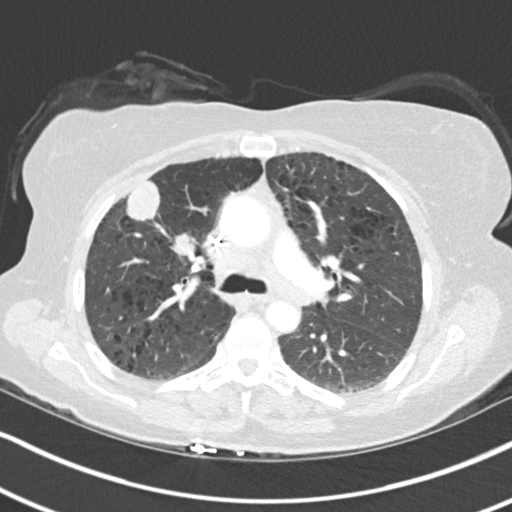
[im 102/148  mediastinal]
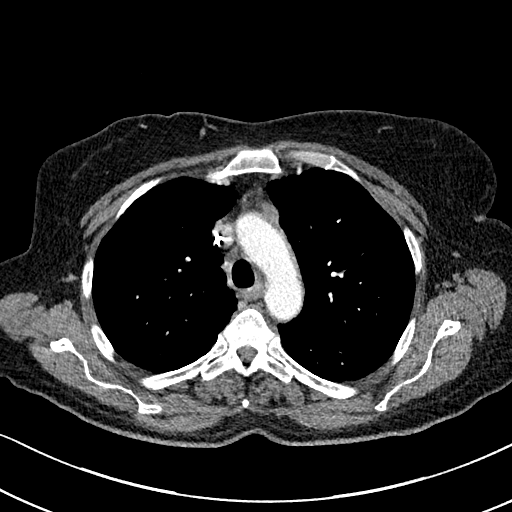
[im 102/148  lung]
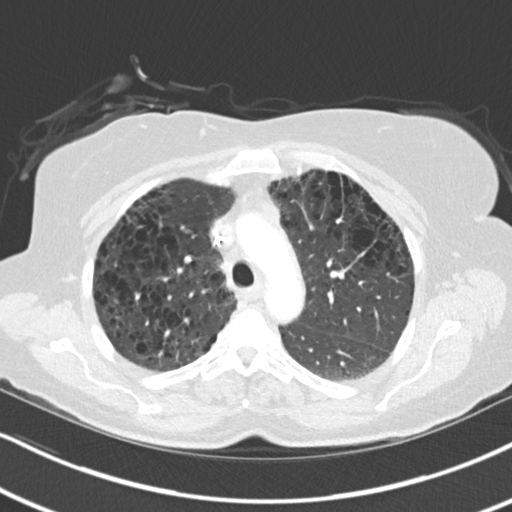
[im 114/148  lung]
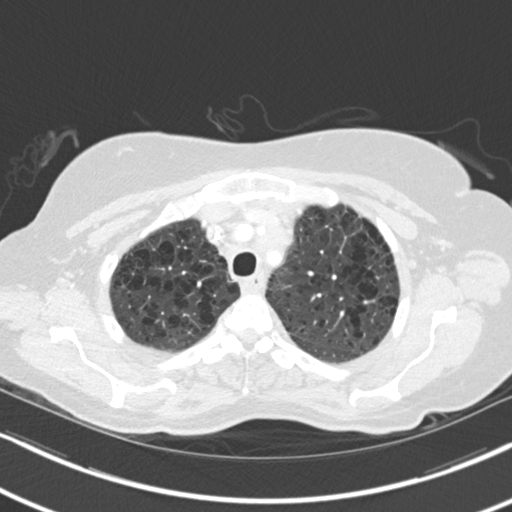
[im 125/148  lung]
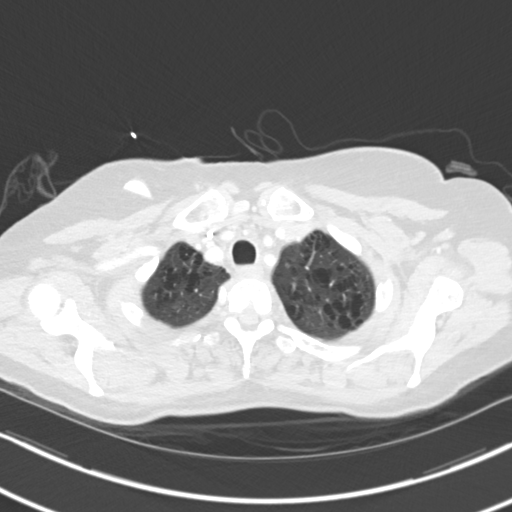
[im 136/148  lung]
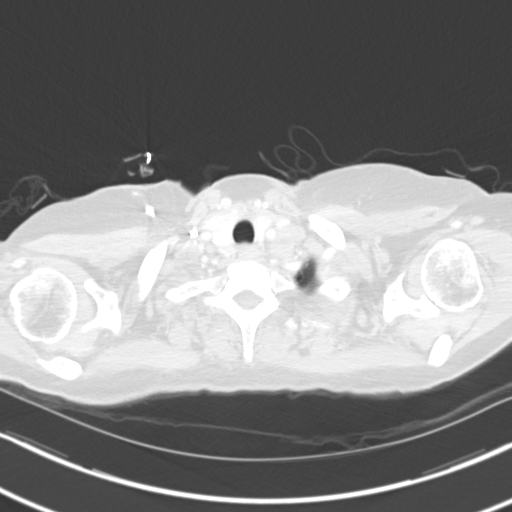

[Series 6: coronal · coronal · 0.60mm/px · 3 of 126 slices shown]
[im 26/126  lung]
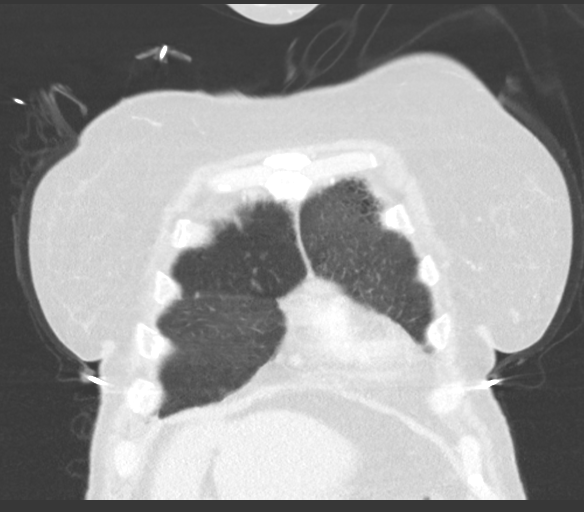
[im 51/126  lung]
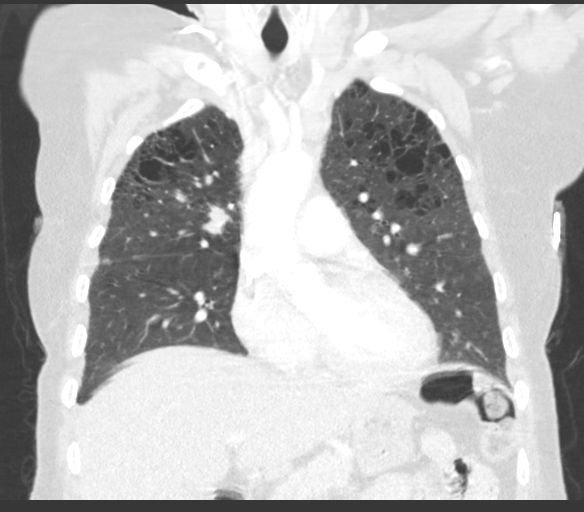
[im 76/126  lung]
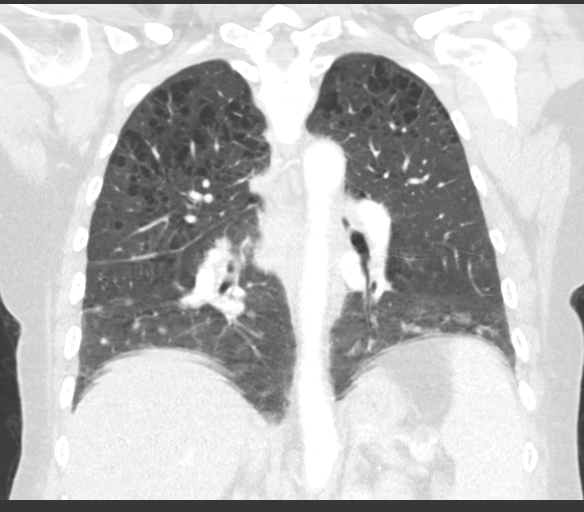

[15 of 36 positions shown; findings below may reference images not displayed]

FINDINGS: Cardiovascular: The heart size is normal. No substantial pericardial
effusion. Atherosclerotic calcification is noted in the wall of the
thoracic aorta. Right Port-A-Cath tip is positioned at the SVC/RA
junction.

Mediastinum/Nodes: 14 mm short axis left supraclavicular node
identified on image 13/series 2. Mediastinal lymphadenopathy evident
with 15 mm short axis AP window node on 54/2 and 16 mm short axis
subcarinal node on 71/2. Associated right hilar lymphadenopathy
evident. The esophagus has normal imaging features. There is no
axillary lymphadenopathy.

Lungs/Pleura: Centrilobular emphsyema noted. 2.8 x 2.1 cm right
upper lobe pulmonary nodule is identified on image 59/5. 2.0 x
cm central right lung nodule is identified anterior to the right
hilum. 1.9 x 1.6 cm irregular spiculated opacity is seen along the
posterior aspect of the minor fissure, associated with micro
nodularity distributed along the major fissure to the lateral
pleura. Hazy dependent ground-glass attenuation in both lower lobes
likely compressive atelectasis. No pleural effusion.

Upper Abdomen: Unremarkable.

Musculoskeletal: No worrisome lytic or sclerotic osseous
abnormality.
IMPRESSION: 1. 2.8 x 2.1 cm right upper lobe pulmonary nodule with 2.0 x 1.8 cm
central right lung nodule anterior to the right hilum. Imaging
features compatible with primary bronchogenic neoplasm.
2. 1.9 x 1.6 cm irregular spiculated opacity inferior right upper
lobe along the posterior aspect of the minor fissure, associated
with micro nodularity along the major fissure to the lateral pleura.
This could represent metastatic disease or synchronous primary with
pleural spread.
3. Mediastinal, right hilar and left supraclavicular lymphadenopathy
consistent with metastatic disease.
4. Aortic Atherosclerosis (F6VFT-V9G.G) and Emphysema ([id number].B).

## 2021-10-12 ENCOUNTER — Emergency Department (HOSPITAL_COMMUNITY): Payer: Medicaid Other

## 2021-10-12 ENCOUNTER — Encounter (HOSPITAL_COMMUNITY): Payer: Self-pay | Admitting: Emergency Medicine

## 2021-10-12 ENCOUNTER — Inpatient Hospital Stay (HOSPITAL_COMMUNITY)
Admission: EM | Admit: 2021-10-12 | Discharge: 2021-10-15 | DRG: 193 | Disposition: A | Discharge status: Hospice | Payer: Medicaid Other | Attending: Pulmonary Disease | Admitting: Pulmonary Disease

## 2021-10-12 ENCOUNTER — Other Ambulatory Visit: Payer: Self-pay

## 2021-10-12 DIAGNOSIS — E878 Other disorders of electrolyte and fluid balance, not elsewhere classified: Secondary | ICD-10-CM | POA: Diagnosis present

## 2021-10-12 DIAGNOSIS — J9600 Acute respiratory failure, unspecified whether with hypoxia or hypercapnia: Secondary | ICD-10-CM | POA: Diagnosis not present

## 2021-10-12 DIAGNOSIS — Z66 Do not resuscitate: Secondary | ICD-10-CM | POA: Diagnosis present

## 2021-10-12 DIAGNOSIS — E876 Hypokalemia: Secondary | ICD-10-CM | POA: Diagnosis present

## 2021-10-12 DIAGNOSIS — Z79891 Long term (current) use of opiate analgesic: Secondary | ICD-10-CM

## 2021-10-12 DIAGNOSIS — Z515 Encounter for palliative care: Secondary | ICD-10-CM | POA: Diagnosis not present

## 2021-10-12 DIAGNOSIS — R739 Hyperglycemia, unspecified: Secondary | ICD-10-CM | POA: Diagnosis present

## 2021-10-12 DIAGNOSIS — F1410 Cocaine abuse, uncomplicated: Secondary | ICD-10-CM | POA: Diagnosis not present

## 2021-10-12 DIAGNOSIS — G9341 Metabolic encephalopathy: Secondary | ICD-10-CM | POA: Diagnosis present

## 2021-10-12 DIAGNOSIS — J189 Pneumonia, unspecified organism: Principal | ICD-10-CM | POA: Diagnosis present

## 2021-10-12 DIAGNOSIS — E119 Type 2 diabetes mellitus without complications: Secondary | ICD-10-CM

## 2021-10-12 DIAGNOSIS — R0602 Shortness of breath: Secondary | ICD-10-CM

## 2021-10-12 DIAGNOSIS — I10 Essential (primary) hypertension: Secondary | ICD-10-CM | POA: Diagnosis present

## 2021-10-12 DIAGNOSIS — C7B8 Other secondary neuroendocrine tumors: Secondary | ICD-10-CM | POA: Diagnosis present

## 2021-10-12 DIAGNOSIS — C7A8 Other malignant neuroendocrine tumors: Secondary | ICD-10-CM | POA: Diagnosis present

## 2021-10-12 DIAGNOSIS — Z789 Other specified health status: Secondary | ICD-10-CM

## 2021-10-12 DIAGNOSIS — F1990 Other psychoactive substance use, unspecified, uncomplicated: Secondary | ICD-10-CM

## 2021-10-12 DIAGNOSIS — E1165 Type 2 diabetes mellitus with hyperglycemia: Secondary | ICD-10-CM | POA: Diagnosis present

## 2021-10-12 DIAGNOSIS — F17210 Nicotine dependence, cigarettes, uncomplicated: Secondary | ICD-10-CM | POA: Diagnosis present

## 2021-10-12 DIAGNOSIS — J9601 Acute respiratory failure with hypoxia: Secondary | ICD-10-CM

## 2021-10-12 DIAGNOSIS — Z79899 Other long term (current) drug therapy: Secondary | ICD-10-CM

## 2021-10-12 DIAGNOSIS — Z9981 Dependence on supplemental oxygen: Secondary | ICD-10-CM

## 2021-10-12 DIAGNOSIS — Z20822 Contact with and (suspected) exposure to covid-19: Secondary | ICD-10-CM | POA: Diagnosis present

## 2021-10-12 DIAGNOSIS — L8990 Pressure ulcer of unspecified site, unspecified stage: Secondary | ICD-10-CM | POA: Insufficient documentation

## 2021-10-12 DIAGNOSIS — J9621 Acute and chronic respiratory failure with hypoxia: Secondary | ICD-10-CM

## 2021-10-12 DIAGNOSIS — Z9221 Personal history of antineoplastic chemotherapy: Secondary | ICD-10-CM

## 2021-10-12 DIAGNOSIS — J440 Chronic obstructive pulmonary disease with acute lower respiratory infection: Secondary | ICD-10-CM | POA: Diagnosis present

## 2021-10-12 DIAGNOSIS — R0902 Hypoxemia: Secondary | ICD-10-CM

## 2021-10-12 DIAGNOSIS — C7931 Secondary malignant neoplasm of brain: Secondary | ICD-10-CM | POA: Diagnosis present

## 2021-10-12 DIAGNOSIS — E871 Hypo-osmolality and hyponatremia: Secondary | ICD-10-CM | POA: Diagnosis present

## 2021-10-12 DIAGNOSIS — Z8249 Family history of ischemic heart disease and other diseases of the circulatory system: Secondary | ICD-10-CM

## 2021-10-12 DIAGNOSIS — Z880 Allergy status to penicillin: Secondary | ICD-10-CM

## 2021-10-12 DIAGNOSIS — L89152 Pressure ulcer of sacral region, stage 2: Secondary | ICD-10-CM | POA: Diagnosis present

## 2021-10-12 DIAGNOSIS — Z886 Allergy status to analgesic agent status: Secondary | ICD-10-CM

## 2021-10-12 DIAGNOSIS — Z7952 Long term (current) use of systemic steroids: Secondary | ICD-10-CM

## 2021-10-12 DIAGNOSIS — R590 Localized enlarged lymph nodes: Secondary | ICD-10-CM | POA: Diagnosis present

## 2021-10-12 DIAGNOSIS — Z8673 Personal history of transient ischemic attack (TIA), and cerebral infarction without residual deficits: Secondary | ICD-10-CM

## 2021-10-12 DIAGNOSIS — J90 Pleural effusion, not elsewhere classified: Secondary | ICD-10-CM | POA: Diagnosis present

## 2021-10-12 DIAGNOSIS — C7800 Secondary malignant neoplasm of unspecified lung: Secondary | ICD-10-CM | POA: Diagnosis present

## 2021-10-12 LAB — I-STAT VENOUS BLOOD GAS, ED
Acid-Base Excess: 8 mmol/L — ABNORMAL HIGH (ref 0.0–2.0)
Bicarbonate: 32.5 mmol/L — ABNORMAL HIGH (ref 20.0–28.0)
Calcium, Ion: 0.88 mmol/L — CL (ref 1.15–1.40)
HCT: 52 % — ABNORMAL HIGH (ref 36.0–46.0)
Hemoglobin: 17.7 g/dL — ABNORMAL HIGH (ref 12.0–15.0)
O2 Saturation: 99 %
Potassium: >8.5 mmol/L (ref 3.5–5.1)
Sodium: 125 mmol/L — ABNORMAL LOW (ref 135–145)
TCO2: 34 mmol/L — ABNORMAL HIGH (ref 22–32)
pCO2, Ven: 44.3 mmHg (ref 44.0–60.0)
pH, Ven: 7.474 — ABNORMAL HIGH (ref 7.250–7.430)
pO2, Ven: 122 mmHg — ABNORMAL HIGH (ref 32.0–45.0)

## 2021-10-12 LAB — CBC WITH DIFFERENTIAL/PLATELET
Abs Immature Granulocytes: 0.12 10*3/uL — ABNORMAL HIGH (ref 0.00–0.07)
Basophils Absolute: 0.1 10*3/uL (ref 0.0–0.1)
Basophils Relative: 0 %
Eosinophils Absolute: 0 10*3/uL (ref 0.0–0.5)
Eosinophils Relative: 0 %
HCT: 53.1 % — ABNORMAL HIGH (ref 36.0–46.0)
Hemoglobin: 16.7 g/dL — ABNORMAL HIGH (ref 12.0–15.0)
Immature Granulocytes: 1 %
Lymphocytes Relative: 6 %
Lymphs Abs: 1.2 10*3/uL (ref 0.7–4.0)
MCH: 24.7 pg — ABNORMAL LOW (ref 26.0–34.0)
MCHC: 31.5 g/dL (ref 30.0–36.0)
MCV: 78.4 fL — ABNORMAL LOW (ref 80.0–100.0)
Monocytes Absolute: 1.1 10*3/uL — ABNORMAL HIGH (ref 0.1–1.0)
Monocytes Relative: 5 %
Neutro Abs: 18.2 10*3/uL — ABNORMAL HIGH (ref 1.7–7.7)
Neutrophils Relative %: 88 %
Platelets: 492 10*3/uL — ABNORMAL HIGH (ref 150–400)
RBC: 6.77 MIL/uL — ABNORMAL HIGH (ref 3.87–5.11)
RDW: 14.7 % (ref 11.5–15.5)
WBC: 20.8 10*3/uL — ABNORMAL HIGH (ref 4.0–10.5)
nRBC: 0 % (ref 0.0–0.2)

## 2021-10-12 LAB — COMPREHENSIVE METABOLIC PANEL
ALT: 9 U/L (ref 0–44)
AST: 20 U/L (ref 15–41)
Albumin: 2.9 g/dL — ABNORMAL LOW (ref 3.5–5.0)
Alkaline Phosphatase: 77 U/L (ref 38–126)
Anion gap: 18 — ABNORMAL HIGH (ref 5–15)
BUN: <5 mg/dL — ABNORMAL LOW (ref 6–20)
CO2: 26 mmol/L (ref 22–32)
Calcium: 9.1 mg/dL (ref 8.9–10.3)
Chloride: 89 mmol/L — ABNORMAL LOW (ref 98–111)
Creatinine, Ser: 0.72 mg/dL (ref 0.44–1.00)
GFR, Estimated: >60 mL/min (ref >60)
Glucose, Bld: 208 mg/dL — ABNORMAL HIGH (ref 70–99)
Potassium: 3.5 mmol/L (ref 3.5–5.1)
Sodium: 133 mmol/L — ABNORMAL LOW (ref 135–145)
Total Bilirubin: 1.2 mg/dL (ref 0.3–1.2)
Total Protein: 8 g/dL (ref 6.5–8.1)

## 2021-10-12 LAB — RESP PANEL BY RT-PCR (FLU A&B, COVID) ARPGX2
Influenza A by PCR: NEGATIVE
Influenza B by PCR: NEGATIVE
SARS Coronavirus 2 by RT PCR: NEGATIVE

## 2021-10-12 LAB — BRAIN NATRIURETIC PEPTIDE: B Natriuretic Peptide: 134.9 pg/mL — ABNORMAL HIGH (ref 0.0–100.0)

## 2021-10-12 LAB — LACTIC ACID, PLASMA
Lactic Acid, Venous: 2 mmol/L (ref 0.5–1.9)
Lactic Acid, Venous: 2.2 mmol/L (ref 0.5–1.9)

## 2021-10-12 LAB — TROPONIN I (HIGH SENSITIVITY)
Troponin I (High Sensitivity): 36 ng/L — ABNORMAL HIGH (ref <18)
Troponin I (High Sensitivity): 23 ng/L — ABNORMAL HIGH (ref <18)

## 2021-10-12 LAB — CBG MONITORING, ED: Glucose-Capillary: 245 mg/dL — ABNORMAL HIGH (ref 70–99)

## 2021-10-12 MED ORDER — VANCOMYCIN HCL 1500 MG/300ML IV SOLN
1500.0000 mg | Freq: Once | INTRAVENOUS | Status: AC
  Administered 2021-10-12: 1500 mg via INTRAVENOUS
  Filled 2021-10-12: qty 300

## 2021-10-12 MED ORDER — MAGNESIUM SULFATE 2 GM/50ML IV SOLN
2.0000 g | INTRAVENOUS | Status: AC
  Administered 2021-10-12: 2 g via INTRAVENOUS
  Filled 2021-10-12: qty 50

## 2021-10-12 MED ORDER — METRONIDAZOLE 500 MG/100ML IV SOLN
500.0000 mg | Freq: Two times a day (BID) | INTRAVENOUS | Status: DC
  Administered 2021-10-12 – 2021-10-15 (×6): 500 mg via INTRAVENOUS
  Filled 2021-10-12 (×8): qty 100

## 2021-10-12 MED ORDER — VANCOMYCIN HCL IN DEXTROSE 1-5 GM/200ML-% IV SOLN: 1000.0000 mg | Freq: Once | INTRAVENOUS | Status: DC

## 2021-10-12 MED ORDER — SODIUM CHLORIDE 0.9 % IV SOLN
2.0000 g | Freq: Once | INTRAVENOUS | Status: AC
  Administered 2021-10-12: 2 g via INTRAVENOUS
  Filled 2021-10-12: qty 2

## 2021-10-12 MED ORDER — METHYLPREDNISOLONE SODIUM SUCC 125 MG IJ SOLR
125.0000 mg | Freq: Once | INTRAMUSCULAR | Status: AC
  Administered 2021-10-12: 125 mg via INTRAVENOUS
  Filled 2021-10-12: qty 2

## 2021-10-12 MED ORDER — SODIUM CHLORIDE 0.9 % IV SOLN
2.0000 g | Freq: Three times a day (TID) | INTRAVENOUS | Status: DC
  Administered 2021-10-13 – 2021-10-15 (×7): 2 g via INTRAVENOUS
  Filled 2021-10-12 (×7): qty 2

## 2021-10-12 MED ORDER — ALBUTEROL SULFATE (2.5 MG/3ML) 0.083% IN NEBU
5.0000 mg | INHALATION_SOLUTION | Freq: Once | RESPIRATORY_TRACT | Status: AC
  Administered 2021-10-12: 5 mg via RESPIRATORY_TRACT
  Filled 2021-10-12: qty 6

## 2021-10-12 MED ORDER — SODIUM CHLORIDE 0.9 % IV SOLN: 2.0000 g | Freq: Three times a day (TID) | INTRAVENOUS | Status: DC

## 2021-10-12 MED ORDER — SODIUM CHLORIDE 0.9 % IV BOLUS
500.0000 mL | Freq: Once | INTRAVENOUS | Status: AC
  Administered 2021-10-12: 500 mL via INTRAVENOUS

## 2021-10-12 MED ORDER — IOHEXOL 350 MG/ML SOLN
75.0000 mL | Freq: Once | INTRAVENOUS | Status: AC | PRN
  Administered 2021-10-12: 75 mL via INTRAVENOUS

## 2021-10-12 MED ORDER — VANCOMYCIN HCL 750 MG/150ML IV SOLN
750.0000 mg | Freq: Two times a day (BID) | INTRAVENOUS | Status: DC
  Administered 2021-10-13: 750 mg via INTRAVENOUS
  Filled 2021-10-12 (×2): qty 150

## 2021-10-12 NOTE — Assessment & Plan Note (Deleted)
Please call [facility] tomorrow to obtain further information on goals of care discussion.

## 2021-10-12 NOTE — Assessment & Plan Note (Addendum)
In the setting of known malignancy.  WBC noted to be elevated.  Lactic acid was also mildly elevated.  Started on broad-spectrum antibiotics with vancomycin and cefepime and flagyl.  Azithromycin was added.  MRSA PCR negative.  Vancomycin has been discontinued.  Follow-up on blood cultures.  Respiratory status remains tenuous. WBC is better.

## 2021-10-12 NOTE — ED Notes (Signed)
Pt placed her money and credit cards into a blue glove and placed it on the bed.

## 2021-10-12 NOTE — H&P (Signed)
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  Patient noted to be hypoxic with room air saturations of 78% per EMS.  Nursing found multiple needles along with the clear plastic bag with a white substance within the bag.  This was given to security.  Patient unable give any history review of systems due to current and IV therapy.  Discussed the case with the patient's Sister [name] at the bedside.  Patient has abused IV drugs for very long time.  Sister is aware the patient has metastatic and terminal neuroendocrine cancer.  Due to the patient's respiratory failure, [facility] hospitalist contacted for admission.    ED Course: placed on NIV. WBC 20K. CT chest showed metastatic lung cancer and possible pna.  Review of Systems:  Review of Systems  Unable to perform ROS: Severe respiratory distress   Past Medical History:  Diagnosis Date   Cancer ([place])    Colitis, ulcerative ([place])    Depression    Hepatitis C    Hypertension    Stroke ([place])    Thyroid disease     Past Surgical History:  Procedure Laterality Date   ARM WOUND REPAIR / CLOSURE     COLONOSCOPY     OPEN REDUCTION INTERNAL FIXATION (ORIF) DISTAL RADIAL FRACTURE Left 01/07/2018   Procedure: OPEN REDUCTION INTERNAL FIXATION (ORIF) DISTAL RADIAL FRACTURE;  Surgeon: Leanora Cover, MD;  Location: [facility];  Service: Orthopedics;  Laterality: Left;   TEE WITHOUT CARDIOVERSION N/A 12/17/2016   Procedure: TRANSESOPHAGEAL ECHOCARDIOGRAM (TEE);  Surgeon: Josue Hector, MD;  Location: [facility] ENDOSCOPY;  Service: Cardiovascular;  Laterality: N/A;   TONSILLECTOMY       reports that she has been smoking cigarettes. She has been smoking an average of 1.5 packs per day. She has never used smokeless tobacco. She reports current alcohol use. She reports current drug use. Drugs: Cocaine and Marijuana.  Allergies  Allergen Reactions   Asa [Aspirin] Other (See Comments)    Ulcerative colitis flares.   Nsaids Other (See Comments)    Ulcerative colitis flares   Penicillins Rash and Other (See Comments)    Has patient had a PCN reaction causing immediate rash, facial/tongue/throat swelling, SOB or lightheadedness with hypotension:unsure Has patient had a PCN reaction causing severe rash involving mucus membranes or skin necrosis:unsure Has patient had a PCN reaction that required hospitalization:No Has patient had a PCN reaction occurring within the last 10 years:Yes If all of the above answers are "NO", then may proceed with Cephalosporin use.     Family History  Problem Relation Age of Onset   Hypertension Mother    Hypertension Father     Prior to Admission medications   Medication Sig Start Date End Date Taking? Authorizing Provider  levETIRAcetam (KEPPRA) 500 MG tablet TAKE 1 TABLET (500 MG TOTAL) BY MOUTH 2 (TWO) TIMES DAILY. 07/18/20 07/18/21  [place] Donnamarie Poag, DO  mesalamine (LIALDA) 1.2 g EC tablet Take 2 tablets (2.4 g total) by mouth daily with breakfast. Patient not taking: Reported on 08/29/2020 07/19/20 10/17/20  [place] Sophia Sperry J,  DO  methimazole (TAPAZOLE) 10 MG tablet TAKE 2 TABLETS (20 MG TOTAL) BY MOUTH DAILY. 07/18/20 07/18/21  [place] Allyn Bartelson J, DO  ondansetron (ZOFRAN) 8 MG tablet Take 1 tablet (8 mg total) by mouth every 8 (eight) hours as needed for nausea or vomiting. 08/03/20   Orson Slick, MD  oxyCODONE (OXY IR/ROXICODONE) 5 MG immediate release tablet Take 1 tablet (5 mg total) by mouth every 4 (four) hours as needed for severe  pain. 08/17/20   Orson Slick, MD  potassium chloride SA (KLOR-CON) 20 MEQ tablet Take 1 tablet (20 mEq total) by mouth 2 (two) times daily. Patient not taking: Reported on 08/29/2020 08/03/20   Orson Slick, MD  prochlorperazine (COMPAZINE) 10 MG tablet TAKE 1 TABLET (10 MG TOTAL) BY MOUTH EVERY 4 (FOUR) HOURS AS NEEDED FOR NAUSEA OR VOMITING. MAY CRUSH, MIX WITH WATER AND GIVE SUBLIN<MORE> 07/18/20 07/18/21  [place] Donnamarie Poag, DO  propranolol (INDERAL) 20 MG tablet TAKE 1 TABLET (20 MG TOTAL) BY MOUTH 2 (TWO) TIMES DAILY. 07/18/20 07/18/21  [place] Cataleah Stites J, DO    Physical Exam: Vitals:   10/12/21 1936 10/12/21 2000 10/12/21 2100 10/12/21 2115  BP: (!) 148/90 (!) 150/97 (!) 181/102 (!) 175/108  Pulse: (!) 120 (!) 117 (!) 110 (!) 114  Resp: 20 (!) 23 19 (!) 34  Temp:      TempSrc:      SpO2: 96% 96% 95% 97%    Physical Exam Vitals and nursing note reviewed.  Constitutional:      Appearance: She is ill-appearing. She is not diaphoretic.     Comments: Chronically ill-appearing.  Appears older than stated age. Disheveled and dirty.  HENT:     Head: Normocephalic and atraumatic.  Cardiovascular:     Rate and Rhythm: Regular rhythm. Tachycardia present.  Pulmonary:     Breath sounds: Wheezing and rhonchi present.     Comments: Diffuse rhonchi.  Patient on NIV. Abdominal:     General: There is no distension.     Tenderness: There is no abdominal tenderness.  Skin:    Comments: Multiple track marks in upper extremities.  Neurological:     Mental Status: She is disoriented.     Comments: Disoriented.     Labs on Admission: I have personally reviewed following labs and imaging studies  CBC: Recent Labs  Lab 10/12/21 1603 10/12/21 1825  WBC 20.8*  --   NEUTROABS 18.2*  --   HGB 16.7* 17.7*  HCT 53.1* 52.0*  MCV 78.4*  --   PLT 492*  --    Basic Metabolic Panel: Recent Labs  Lab 10/12/21 1603 10/12/21 1825  NA 133* 125*  K 3.5 >8.5*  CL 89*  --   CO2 26  --    GLUCOSE 208*  --   BUN <5*  --   CREATININE 0.72  --   CALCIUM 9.1  --    GFR: CrCl cannot be calculated (Unknown ideal weight.). Liver Function Tests: Recent Labs  Lab 10/12/21 1603  AST 20  ALT 9  ALKPHOS 77  BILITOT 1.2  PROT 8.0  ALBUMIN 2.9*   No results for input(s): LIPASE, AMYLASE in the last 168 hours. No results for input(s): AMMONIA in the last 168 hours. Coagulation Profile: No results for input(s): INR, PROTIME in the last 168 hours. Cardiac Enzymes: No results for input(s): CKTOTAL, CKMB, CKMBINDEX, TROPONINI in the last 168 hours. BNP (last 3 results) No results for input(s): PROBNP in  the last 8760 hours. HbA1C: No results for input(s): HGBA1C in the last 72 hours. CBG: Recent Labs  Lab 10/12/21 1606  GLUCAP 245*   Lipid Profile: No results for input(s): CHOL, HDL, LDLCALC, TRIG, CHOLHDL, LDLDIRECT in the last 72 hours. Thyroid Function Tests: No results for input(s): TSH, T4TOTAL, FREET4, T3FREE, THYROIDAB in the last 72 hours. Anemia Panel: No results for input(s): VITAMINB12, FOLATE, FERRITIN, TIBC, IRON, RETICCTPCT in the last 72 hours. Urine analysis:    Component Value Date/Time   COLORURINE YELLOW 07/13/2020 0620   APPEARANCEUR CLEAR 07/13/2020 0620   LABSPEC 1.016 07/13/2020 0620   PHURINE 5.0 07/13/2020 0620   GLUCOSEU NEGATIVE 07/13/2020 0620   HGBUR NEGATIVE 07/13/2020 0620   BILIRUBINUR NEGATIVE 07/13/2020 0620   KETONESUR 5 (A) 07/13/2020 0620   PROTEINUR 30 (A) 07/13/2020 0620   NITRITE NEGATIVE 07/13/2020 0620   LEUKOCYTESUR NEGATIVE 07/13/2020 0620    Radiological Exams on Admission: I have personally reviewed images CT Angio Chest PE W and/or Wo Contrast  Result Date: 10/12/2021 CLINICAL DATA:  Pulmonary embolism (PE) suspected, high prob. Shortness of breath EXAM: CT ANGIOGRAPHY CHEST WITH CONTRAST TECHNIQUE: Multidetector CT imaging of the chest was performed using the standard protocol during bolus administration of  intravenous contrast. Multiplanar CT image reconstructions and MIPs were obtained to evaluate the vascular anatomy. RADIATION DOSE REDUCTION: This exam was performed according to the departmental dose-optimization program which includes automated exposure control, adjustment of the mA and/or kV according to patient size and/or use of iterative reconstruction technique. CONTRAST:  78m OMNIPAQUE IOHEXOL 350 MG/ML SOLN COMPARISON:  09/13/2020 FINDINGS: Cardiovascular: No filling defects in the pulmonary arteries to suggest pulmonary emboli. Heart is normal size. Aorta is normal caliber. Mediastinum/Nodes: Significantly worsening mediastinal adenopathy. Conglomerate subcarinal nodal mass has a short axis diameter of 4.7 cm. Mild bilateral hilar adenopathy is similar to prior study. Lungs/Pleura: Worsening masses in the right upper lobe. Central right upper lobe mass currently measures 5.3 x 4.7 cm compared to 2.0 x 1.8 cm previously. Peripheral right upper lobe mass lesion measures 3.7 x 3.6 cm compared to 2.8 x 2.1 cm previously. Moderate right pleural effusion. Small right side pneumothorax noted, best seen overlying the peripheral right upper lobe mass. Moderate centrilobular emphysema. Airspace disease seen bilaterally in the lower lobes, left greater than right as well as in the right upper lobe. Findings concerning for pneumonia. Upper Abdomen: Imaging into the upper abdomen demonstrates no acute findings. Musculoskeletal: Chest wall soft tissues are unremarkable. No acute bony abnormality. Review of the MIP images confirms the above findings. IMPRESSION: No evidence of pulmonary embolus. Worsening masses in the right upper lobe concerning for worsening lung cancer. Markedly worsening mediastinal adenopathy. Moderate right pleural effusion. Small right pneumothorax of unknown etiology. Patchy bilateral airspace opacities, left greater than right concerning for pneumonia. Aortic Atherosclerosis (ICD10-I70.0) and  Emphysema (ICD10-J43.9). Electronically Signed   By: KRolm BaptiseM.D.   On: 10/12/2021 21:01   DG Chest Port 1 View  Result Date: 10/12/2021 CLINICAL DATA:  Shortness of breath EXAM: PORTABLE CHEST 1 VIEW COMPARISON:  07/13/2020 FINDINGS: Right Port-A-Cath remains in place, unchanged. Heart is normal size. Patchy bilateral airspace disease, worsening since prior study. Rounded masslike opacity in the right mid lung again noted suspect small right effusion. Probable mediastinal and hilar adenopathy with bilateral hilar fullness. IMPRESSION: Right mid lung mass with bilateral hilar adenopathy and mediastinal adenopathy suspected. Patchy bilateral airspace disease concerning for pneumonia. Small right effusion. Electronically Signed   By:  Rolm Baptise M.D.   On: 10/12/2021 17:19    EKG: I have personally reviewed EKG: sinus tachycardia    Assessment/Plan Principal Problem:   Acute respiratory failure (HCC) Active Problems:   Pneumonia   Cocaine abuse ([place])   IV drug user   Neuroendocrine carcinoma metastatic to multiple sites [facility])   Hospice care patient - yet remains a FULL CODE    Cocaine abuse ([place]) Avoid betablockers in the patient due to her chronic cocaine abuse.  Acute respiratory failure ([place]) Admit to progressive observation bed. Continue with NIV. Pt is a full code despite being on hospice care.  Discussed with the patient's Sister [name] at the bedside.  Given her advanced lung cancer, outcome of a cardiac respiratory status would likely result in an unfavorable outcome.  Repeat ABG in the morning.  Pneumonia Start IV Zosyn and vancomycin.  Neuroendocrine carcinoma metastatic to multiple sites [facility]) Chronic.  Patient not receiving any chemo or other advanced therapy.  She is enrolled in hospice care but remains a full code.  IV drug user Known IV cocaine user.  Urine drug screen pending.  Hospice care patient - yet remains a FULL CODE Please call [facility] tomorrow to  obtain further information on goals of care discussion.  DVT prophylaxis: SQ Heparin Code Status: Full Code Family Communication: discussed at bedside with pt's sister tammy  Disposition Plan: return home  Consults called: none  Admission status: Observation,  progressive   Kristopher Oppenheim, DO [facility] 10/12/2021, 10:02 PM

## 2021-10-12 NOTE — Progress Notes (Signed)
RT X2 unsuccessful with obtaining ABG. RT notified MD and requested VBG be obtained at this time.

## 2021-10-12 NOTE — ED Triage Notes (Signed)
Pt from home via [facility] with reports of SHOB. Pt initially 78% on RA upon EMS arrival. PT received 2 duonebs prior to arrival.

## 2021-10-12 NOTE — ED Notes (Signed)
RN called RT to assist in transporting pt to CT on BiPAP. RT reports that he is currently with another pt and will assist in transporting this pt once he is available. RN made CT aware of delay.

## 2021-10-12 NOTE — Progress Notes (Signed)
Pharmacy Antibiotic Note  Holly Weber is a 56 y.o. female admitted on 10/12/2021 presenting with SOB.  Pharmacy has been consulted for vancomycin dosing.  Cefepime per MD  Plan: Vancomycin 1500 mg IV x 1, then 750 mg IV q 12h (eAUC 425, Goal AUC 400-550, SCr used 0.8) Add MRSA PCR Monitor renal function, Cx/PCR to narrow Vancomycin levels as indicated     Temp (24hrs), Avg:99.8 F (37.7 C), Min:99.8 F (37.7 C), Max:99.8 F (37.7 C)  Recent Labs  Lab 10/12/21 1603 10/12/21 1722  WBC 20.8*  --   CREATININE 0.72  --   LATICACIDVEN  --  2.0*    CrCl cannot be calculated (Unknown ideal weight.).    Allergies  Allergen Reactions   Asa [Aspirin] Other (See Comments)    Ulcerative colitis flares.   Nsaids Other (See Comments)    Ulcerative colitis flares   Penicillins Rash and Other (See Comments)    Has patient had a PCN reaction causing immediate rash, facial/tongue/throat swelling, SOB or lightheadedness with hypotension:unsure Has patient had a PCN reaction causing severe rash involving mucus membranes or skin necrosis:unsure Has patient had a PCN reaction that required hospitalization:No Has patient had a PCN reaction occurring within the last 10 years:Yes If all of the above answers are "NO", then may proceed with Cephalosporin use.     Bertis Ruddy, PharmD Clinical Pharmacist ED Pharmacist Phone # [phone] 10/12/2021 8:38 PM

## 2021-10-12 NOTE — Assessment & Plan Note (Addendum)
Not on active treatment.  Seen by Dr. Lorenso Courier with medical oncology within the past 2 years.  Currently under hospice care but was full code.   Noted to be on multiple different long-term pain medications including methadone as well as fentanyl patch.  Also noted to be in Oxy IR.  Due to excessive somnolence pain medication doses were adjusted.  Holding off on fentanyl patch.  Ativan was discontinued.

## 2021-10-12 NOTE — ED Notes (Signed)
Date and time results received: 10/12/21 6:50 PM  (use smartphrase ".now" to insert current time)  Test:Lactic acid Critical Value: 2.0  Name of Provider Notified: Abby, PA  Orders Received? Or Actions Taken?: see chart

## 2021-10-12 NOTE — ED Provider Notes (Signed)
[facility] EMERGENCY DEPARTMENT Provider Note   CSN: [id number] Arrival date & time: 10/12/21  1555     History  Chief Complaint  Patient presents with   Shortness of Breath    Holly Weber is a 56 y.o. female with a past medical history of COPD, metastatic neuroendocrine carcinoma, bronchogenic carcinoma, IV drug use, crack cocaine abuse who presents in respiratory extremis.  Patient arrives on nonrebreather mask at 100% oxygen.  O2 sats are approximately 90%.  She was found to be in the 70s by EMS prior to arrival.  Patient states that her breathing issues started today.  She has much difficulty answering questions due to respiratory distress.  She states that she is not under treatment for cancer.   Shortness of Breath     Home Medications Prior to Admission medications   Medication Sig Start Date End Date Taking? Authorizing Provider  levETIRAcetam (KEPPRA) 500 MG tablet TAKE 1 TABLET (500 MG TOTAL) BY MOUTH 2 (TWO) TIMES DAILY. 07/18/20 07/18/21  [place] Donnamarie Poag, DO  mesalamine (LIALDA) 1.2 g EC tablet Take 2 tablets (2.4 g total) by mouth daily with breakfast. Patient not taking: Reported on 08/29/2020 07/19/20 10/17/20  [place] Donnamarie Poag, DO  methimazole (TAPAZOLE) 10 MG tablet TAKE 2 TABLETS (20 MG TOTAL) BY MOUTH DAILY. 07/18/20 07/18/21  [place] Eric J, DO  ondansetron (ZOFRAN) 8 MG tablet Take 1 tablet (8 mg total) by mouth every 8 (eight) hours as needed for nausea or vomiting. 08/03/20   Orson Slick, MD  oxyCODONE (OXY IR/ROXICODONE) 5 MG immediate release tablet Take 1 tablet (5 mg total) by mouth every 4 (four) hours as needed for severe pain. 08/17/20   Orson Slick, MD  potassium chloride SA (KLOR-CON) 20 MEQ tablet Take 1 tablet (20 mEq total) by mouth 2 (two) times daily. Patient not taking: Reported on 08/29/2020 08/03/20   Orson Slick, MD  prochlorperazine (COMPAZINE) 10 MG tablet TAKE 1 TABLET (10 MG TOTAL) BY MOUTH EVERY 4  (FOUR) HOURS AS NEEDED FOR NAUSEA OR VOMITING. MAY CRUSH, MIX WITH WATER AND GIVE SUBLIN<MORE> 07/18/20 07/18/21  [place] Donnamarie Poag, DO  propranolol (INDERAL) 20 MG tablet TAKE 1 TABLET (20 MG TOTAL) BY MOUTH 2 (TWO) TIMES DAILY. 07/18/20 07/18/21  [place] Donnamarie Poag, DO      Allergies    Asa [aspirin], Nsaids, and Penicillins    Review of Systems   Review of Systems  Respiratory:  Positive for shortness of breath.    Physical Exam Updated Vital Signs BP (!) 175/108    Pulse (!) 114    Temp 99.8 F (37.7 C) (Axillary)    Resp (!) 34    SpO2 97%  Physical Exam Vitals and nursing note reviewed.  Constitutional:      Comments: Appears older than stated age, in respiratory distress on nonrebreather  HENT:     Head: Normocephalic and atraumatic.  Eyes:     Extraocular Movements: Extraocular movements intact.     Pupils: Pupils are equal, round, and reactive to light.  Cardiovascular:     Rate and Rhythm: Tachycardia present.  Pulmonary:     Effort: Prolonged expiration and respiratory distress present.     Breath sounds: Decreased air movement present.  Musculoskeletal:     Cervical back: Normal range of motion and neck supple.     Right lower leg: No edema.     Left lower leg: No edema.  Psychiatric:  Mood and Affect: Mood is anxious.        Behavior: Behavior is agitated.    ED Results / Procedures / Treatments   Labs (all labs ordered are listed, but only abnormal results are displayed) Labs Reviewed  CBC WITH DIFFERENTIAL/PLATELET - Abnormal; Notable for the following components:      Result Value   WBC 20.8 (*)    RBC 6.77 (*)    Hemoglobin 16.7 (*)    HCT 53.1 (*)    MCV 78.4 (*)    MCH 24.7 (*)    Platelets 492 (*)    Neutro Abs 18.2 (*)    Monocytes Absolute 1.1 (*)    Abs Immature Granulocytes 0.12 (*)    All other components within normal limits  BRAIN NATRIURETIC PEPTIDE - Abnormal; Notable for the following components:   B Natriuretic Peptide 134.9 (*)     All other components within normal limits  COMPREHENSIVE METABOLIC PANEL - Abnormal; Notable for the following components:   Sodium 133 (*)    Chloride 89 (*)    Glucose, Bld 208 (*)    BUN <5 (*)    Albumin 2.9 (*)    Anion gap 18 (*)    All other components within normal limits  LACTIC ACID, PLASMA - Abnormal; Notable for the following components:   Lactic Acid, Venous 2.0 (*)    All other components within normal limits  LACTIC ACID, PLASMA - Abnormal; Notable for the following components:   Lactic Acid, Venous 2.2 (*)    All other components within normal limits  CBG MONITORING, ED - Abnormal; Notable for the following components:   Glucose-Capillary 245 (*)    All other components within normal limits  I-STAT VENOUS BLOOD GAS, ED - Abnormal; Notable for the following components:   pH, Ven 7.474 (*)    pO2, Ven 122.0 (*)    Bicarbonate 32.5 (*)    TCO2 34 (*)    Acid-Base Excess 8.0 (*)    Sodium 125 (*)    Potassium >8.5 (*)    Calcium, Ion 0.88 (*)    HCT 52.0 (*)    Hemoglobin 17.7 (*)    All other components within normal limits  TROPONIN I (HIGH SENSITIVITY) - Abnormal; Notable for the following components:   Troponin I (High Sensitivity) 36 (*)    All other components within normal limits  TROPONIN I (HIGH SENSITIVITY) - Abnormal; Notable for the following components:   Troponin I (High Sensitivity) 23 (*)    All other components within normal limits  RESP PANEL BY RT-PCR (FLU A&B, COVID) ARPGX2  CULTURE, BLOOD (ROUTINE X 2)  CULTURE, BLOOD (ROUTINE X 2)  CULTURE, BLOOD (SINGLE)  MRSA NEXT GEN BY PCR, NASAL  RAPID URINE DRUG SCREEN, HOSP PERFORMED  URINALYSIS, ROUTINE W REFLEX MICROSCOPIC    EKG None  Radiology CT Angio Chest PE W and/or Wo Contrast  Result Date: 10/12/2021 CLINICAL DATA:  Pulmonary embolism (PE) suspected, high prob. Shortness of breath EXAM: CT ANGIOGRAPHY CHEST WITH CONTRAST TECHNIQUE: Multidetector CT imaging of the chest was  performed using the standard protocol during bolus administration of intravenous contrast. Multiplanar CT image reconstructions and MIPs were obtained to evaluate the vascular anatomy. RADIATION DOSE REDUCTION: This exam was performed according to the departmental dose-optimization program which includes automated exposure control, adjustment of the mA and/or kV according to patient size and/or use of iterative reconstruction technique. CONTRAST:  47m OMNIPAQUE IOHEXOL 350 MG/ML SOLN COMPARISON:  09/13/2020 FINDINGS:  Cardiovascular: No filling defects in the pulmonary arteries to suggest pulmonary emboli. Heart is normal size. Aorta is normal caliber. Mediastinum/Nodes: Significantly worsening mediastinal adenopathy. Conglomerate subcarinal nodal mass has a short axis diameter of 4.7 cm. Mild bilateral hilar adenopathy is similar to prior study. Lungs/Pleura: Worsening masses in the right upper lobe. Central right upper lobe mass currently measures 5.3 x 4.7 cm compared to 2.0 x 1.8 cm previously. Peripheral right upper lobe mass lesion measures 3.7 x 3.6 cm compared to 2.8 x 2.1 cm previously. Moderate right pleural effusion. Small right side pneumothorax noted, best seen overlying the peripheral right upper lobe mass. Moderate centrilobular emphysema. Airspace disease seen bilaterally in the lower lobes, left greater than right as well as in the right upper lobe. Findings concerning for pneumonia. Upper Abdomen: Imaging into the upper abdomen demonstrates no acute findings. Musculoskeletal: Chest wall soft tissues are unremarkable. No acute bony abnormality. Review of the MIP images confirms the above findings. IMPRESSION: No evidence of pulmonary embolus. Worsening masses in the right upper lobe concerning for worsening lung cancer. Markedly worsening mediastinal adenopathy. Moderate right pleural effusion. Small right pneumothorax of unknown etiology. Patchy bilateral airspace opacities, left greater than right  concerning for pneumonia. Aortic Atherosclerosis (ICD10-I70.0) and Emphysema (ICD10-J43.9). Electronically Signed   By: Rolm Baptise M.D.   On: 10/12/2021 21:01   DG Chest Port 1 View  Result Date: 10/12/2021 CLINICAL DATA:  Shortness of breath EXAM: PORTABLE CHEST 1 VIEW COMPARISON:  07/13/2020 FINDINGS: Right Port-A-Cath remains in place, unchanged. Heart is normal size. Patchy bilateral airspace disease, worsening since prior study. Rounded masslike opacity in the right mid lung again noted suspect small right effusion. Probable mediastinal and hilar adenopathy with bilateral hilar fullness. IMPRESSION: Right mid lung mass with bilateral hilar adenopathy and mediastinal adenopathy suspected. Patchy bilateral airspace disease concerning for pneumonia. Small right effusion. Electronically Signed   By: Rolm Baptise M.D.   On: 10/12/2021 17:19    Procedures .Critical Care Performed by: Margarita Mail, PA-C Authorized by: Margarita Mail, PA-C   Critical care provider statement:    Critical care time (minutes):  80   Critical care time was exclusive of:  Separately billable procedures and treating other patients   Critical care was necessary to treat or prevent imminent or life-threatening deterioration of the following conditions:  Respiratory failure   Critical care was time spent personally by me on the following activities:  Development of treatment plan with patient or surrogate, discussions with consultants, evaluation of patient's response to treatment, examination of patient, ordering and review of laboratory studies, ordering and review of radiographic studies, ordering and performing treatments and interventions, pulse oximetry, re-evaluation of patient's condition and review of old charts    Medications Ordered in ED Medications  metroNIDAZOLE (FLAGYL) IVPB 500 mg (0 mg Intravenous Stopped 10/12/21 2126)  vancomycin (VANCOREADY) IVPB 1500 mg/300 mL (has no administration in time range)   vancomycin (VANCOREADY) IVPB 750 mg/150 mL (has no administration in time range)  magnesium sulfate IVPB 2 g 50 mL (0 g Intravenous Stopped 10/12/21 1755)  methylPREDNISolone sodium succinate (SOLU-MEDROL) 125 mg/2 mL injection 125 mg (125 mg Intravenous Given 10/12/21 1638)  albuterol (PROVENTIL) (2.5 MG/3ML) 0.083% nebulizer solution 5 mg (5 mg Nebulization Given 10/12/21 1726)  sodium chloride 0.9 % bolus 500 mL (500 mLs Intravenous New Bag/Given 10/12/21 1958)  ceFEPIme (MAXIPIME) 2 g in sodium chloride 0.9 % 100 mL IVPB (0 g Intravenous Stopped 10/12/21 2126)  iohexol (OMNIPAQUE) 350 MG/ML injection  75 mL (75 mLs Intravenous Contrast Given 10/12/21 2051)    ED Course/ Medical Decision Making/ A&P Clinical Course as of 10/12/21 2213  Sat Oct 12, 2021  1626 Patient in extremis.  Immediately placed on BiPAP.  Patient noted to be extremely hypertensive.  She says that her blood pressure is always that high.  Unsure if the patient takes medications.  Differential diagnosis includes PE, MI, COPD exacerbation, pulmonary edema, pneumothorax. [AH]  3382 Patient appears much more comfortable on BiPAP. [AH]  1723 Lura Em, RN reports that patient pulled out a rusty needle, a "wad of cash" and a baggy of white powder. Given hx of IVDU and marked leukocytosis will add on blood cultures, lactic acid, UA and UDS. [AH]  1820 Patient becoming somnolent- RT unable to obtain an ABG- VBG ordered. [AH]  5053 I-Stat venous blood gas, ([facility] ED)(!!) Potassium hemolyzed  [AH]  1845 pH, Ven(!): 7.474 [AH]  1845 pO2, Ven(!): 122.0 [AH]  1845 pCO2, Ven: 44.3 [AH]  1910 Troponin I (High Sensitivity)(!) Troponin 26, now 23 [AH]  2048 Lactic Acid, Venous(!!): 2.0 Patient receiving fluid and abx  [AH]  2048 WBC(!): 20.8 [AH]  2049 ED EKG EKG shows sinus tach at 144 [AH]  2049 DG Chest Port 1 View CXR shows lung mass seen previously in imaging. ? CAP-  [AH]  2051 CT Angio Chest PE W and/or Wo Contrast I reviewed  CT imaging - I do not see obvious filling defect consistent with PE-She does appear to have Pneumonia- will defer to Radiology interpretation [AH]    Clinical Course User Index [AH] Margarita Mail, PA-C                           Medical Decision Making Amount and/or Complexity of Data Reviewed Independent Historian:     Details: Patient's sister but Labs: ordered. Decision-making details documented in ED Course. Radiology: ordered and independent interpretation performed. Decision-making details documented in ED Course. ECG/medicine tests: ordered and independent interpretation performed. Decision-making details documented in ED Course.  Risk Prescription drug management. Decision regarding hospitalization.  Critical Care Total time providing critical care: 75-105 minutes  This patient presents to the ED for concern of resp failure/ sob, this involves an extensive number of treatment options, and is a complaint that carries with it a high risk of complications and morbidity.  The differential diagnosis includes The emergent differential diagnosis for shortness of breath includes, but is not limited to, Pulmonary edema, bronchoconstriction, Pneumonia, Pulmonary embolism, Pneumotherax/ Hemothorax, Dysrythmia, ACS.  After review of all data points patient appears to have community-acquired pneumonia with hypoxic respiratory failure failure.  Likely a component of COPD exacerbation.  I considered the potential for sepsis as patient does have uptrending lactic acid.  I had discussion of end-of-life goals of care and patient continues to remain full code despite having metastatic end-stage neuroendocrine cancer and being on hospice.  Patient does not appear to have evidence of endocarditis based on physical examination however I did order 3 blood cultures.    Co morbidities that complicate the patient evaluation  Metatstaic neuroendocrine tumor with lung involvement on hospice, COPD, active IV  drug usel   Additional history obtained:  Additional history obtained from sister   Lab Tests:  I Ordered, and personally interpreted labs.  The pertinent results include:  per ED course   Imaging Studies ordered:  Per ED course  Cardiac Monitoring:  The patient was maintained on a  cardiac monitor.  I personally viewed and interpreted the cardiac monitored which showed an underlying rhythm of: sinus tachycardia   Medicines ordered and prescription drug management:  I ordered medication including albuterol, Solu-Medrol, magnesium, IV antibiotics and fluids for presumed pneumonia Reevaluation of the patient after these medicines showed that the patient improved I have reviewed the patients home medicines and have made adjustments as needed     Critical Interventions:  Patient has been on BiPAP throughout her visit   Consultations Obtained:  I requested consultation with the hospitalis,  and discussed lab and imaging findings as well as pertinent plan - they recommend: admission   Problem List / ED Course:  Hypoxic respiratory failure on BiPAP, community-acquired pneumonia   Reevaluation:  After the interventions noted above, I reevaluated the patient and found that they have :stayed the same   Social Determinants of Health:  Active IV drug use end-of-life care with full code   Dispostion:  After consideration of the diagnostic results and the patients response to treatment, I feel that the patent would benefit from admission.   Final Clinical Impression(s) / ED Diagnoses Final diagnoses:  Community acquired pneumonia, unspecified laterality  Acute respiratory failure with hypoxia ([place])  Full code status    Rx / DC Orders ED Discharge Orders     None         Margarita Mail, PA-C 10/12/21 2213    Gareth Morgan, MD 10/13/21 [phone]

## 2021-10-12 NOTE — Subjective & Objective (Signed)
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  Patient noted to be hypoxic with room air saturations of 78% per EMS.  Nursing found multiple needles along with the clear plastic bag with a white substance within the bag.  This was given to security.  Patient unable give any history review of systems due to current and IV therapy.  Discussed the case with the patient's Sister [name] at the bedside.  Patient has abused IV drugs for very long time.  Sister is aware the patient has metastatic and terminal neuroendocrine cancer.  Due to the patient's respiratory failure, [facility] hospitalist contacted for admission.

## 2021-10-12 NOTE — Assessment & Plan Note (Addendum)
Longstanding history of same.  Urine drug screen is pending

## 2021-10-12 NOTE — ED Notes (Signed)
RT arrived at pt's room for CT transportation. Respiratory removed pt from BiPAP, placed her on a non-rebreather mask, and instructed this RN to bring pt to CT on non-rebreather. This RN transported pt to CT alone.

## 2021-10-12 NOTE — Progress Notes (Signed)
Pharmacy Antibiotic Note  Holly Weber is a 56 y.o. female admitted on 10/12/2021 presenting with SOB.  Pharmacy has been consulted for vancomycin dosing. Now adding maintenance cefepime.   Plan: Vancomycin 1500 mg IV x 1, then 750 mg IV q 12h (eAUC 425, Goal AUC 400-550, SCr used 0.8) Add MRSA PCR Start cefepime 2 gm IV 8 hours  Monitor renal function, Cx/PCR to narrow Vancomycin levels as indicated     Temp (24hrs), Avg:99.8 F (37.7 C), Min:99.8 F (37.7 C), Max:99.8 F (37.7 C)  Recent Labs  Lab 10/12/21 1603 10/12/21 1722 10/12/21 2002  WBC 20.8*  --   --   CREATININE 0.72  --   --   LATICACIDVEN  --  2.0* 2.2*     CrCl cannot be calculated (Unknown ideal weight.).    Allergies  Allergen Reactions   Asa [Aspirin] Other (See Comments)    Ulcerative colitis flares.   Nsaids Other (See Comments)    Ulcerative colitis flares   Penicillins Rash and Other (See Comments)    Has patient had a PCN reaction causing immediate rash, facial/tongue/throat swelling, SOB or lightheadedness with hypotension:unsure Has patient had a PCN reaction causing severe rash involving mucus membranes or skin necrosis:unsure Has patient had a PCN reaction that required hospitalization:No Has patient had a PCN reaction occurring within the last 10 years:Yes If all of the above answers are "NO", then may proceed with Cephalosporin use.     Albertina Parr, PharmD., BCPS, BCCCP Clinical Pharmacist Please refer to [facility] for unit-specific pharmacist

## 2021-10-12 NOTE — Assessment & Plan Note (Addendum)
It appears that patient uses oxygen at home as well.  Came in with saturations in the 70s.  Found to have pneumonia on imaging studies.  Started on antibiotics.   Initially placed on BiPAP and then subsequently removed.  Currently on Ventimask. Respiratory status remains tenuous.  She was given a dose of Lasix yesterday.  CT angiogram did not show any PE but small pneumothorax and moderate right pleural effusion was noted. Did not tolerate BiPAP overnight.  ABG reviewed. Critical care medicine has been consulted.  Palliative care to meet with family today.  Patient's previously known wishes were to have full scope of care.

## 2021-10-12 NOTE — Progress Notes (Signed)
RT x 2 attempted to obtain an ABG without success.  MD and RN notified.

## 2021-10-12 NOTE — Assessment & Plan Note (Addendum)
Longstanding history of same

## 2021-10-13 DIAGNOSIS — C7A8 Other malignant neuroendocrine tumors: Secondary | ICD-10-CM | POA: Diagnosis not present

## 2021-10-13 DIAGNOSIS — J9600 Acute respiratory failure, unspecified whether with hypoxia or hypercapnia: Secondary | ICD-10-CM | POA: Diagnosis present

## 2021-10-13 DIAGNOSIS — E876 Hypokalemia: Secondary | ICD-10-CM | POA: Diagnosis present

## 2021-10-13 DIAGNOSIS — C7800 Secondary malignant neoplasm of unspecified lung: Secondary | ICD-10-CM | POA: Diagnosis present

## 2021-10-13 DIAGNOSIS — E1165 Type 2 diabetes mellitus with hyperglycemia: Secondary | ICD-10-CM | POA: Diagnosis present

## 2021-10-13 DIAGNOSIS — L89152 Pressure ulcer of sacral region, stage 2: Secondary | ICD-10-CM | POA: Diagnosis present

## 2021-10-13 DIAGNOSIS — J440 Chronic obstructive pulmonary disease with acute lower respiratory infection: Secondary | ICD-10-CM | POA: Diagnosis present

## 2021-10-13 DIAGNOSIS — G9341 Metabolic encephalopathy: Secondary | ICD-10-CM | POA: Diagnosis present

## 2021-10-13 DIAGNOSIS — E878 Other disorders of electrolyte and fluid balance, not elsewhere classified: Secondary | ICD-10-CM | POA: Diagnosis not present

## 2021-10-13 DIAGNOSIS — Z66 Do not resuscitate: Secondary | ICD-10-CM | POA: Diagnosis present

## 2021-10-13 DIAGNOSIS — Z515 Encounter for palliative care: Secondary | ICD-10-CM | POA: Diagnosis not present

## 2021-10-13 DIAGNOSIS — Z886 Allergy status to analgesic agent status: Secondary | ICD-10-CM | POA: Diagnosis not present

## 2021-10-13 DIAGNOSIS — I10 Essential (primary) hypertension: Secondary | ICD-10-CM | POA: Diagnosis present

## 2021-10-13 DIAGNOSIS — R739 Hyperglycemia, unspecified: Secondary | ICD-10-CM

## 2021-10-13 DIAGNOSIS — F1410 Cocaine abuse, uncomplicated: Secondary | ICD-10-CM | POA: Diagnosis present

## 2021-10-13 DIAGNOSIS — L8990 Pressure ulcer of unspecified site, unspecified stage: Secondary | ICD-10-CM | POA: Insufficient documentation

## 2021-10-13 DIAGNOSIS — E871 Hypo-osmolality and hyponatremia: Secondary | ICD-10-CM | POA: Diagnosis present

## 2021-10-13 DIAGNOSIS — J9621 Acute and chronic respiratory failure with hypoxia: Secondary | ICD-10-CM | POA: Diagnosis present

## 2021-10-13 DIAGNOSIS — Z880 Allergy status to penicillin: Secondary | ICD-10-CM | POA: Diagnosis not present

## 2021-10-13 DIAGNOSIS — Z8673 Personal history of transient ischemic attack (TIA), and cerebral infarction without residual deficits: Secondary | ICD-10-CM | POA: Diagnosis not present

## 2021-10-13 DIAGNOSIS — F17210 Nicotine dependence, cigarettes, uncomplicated: Secondary | ICD-10-CM | POA: Diagnosis present

## 2021-10-13 DIAGNOSIS — Z7189 Other specified counseling: Secondary | ICD-10-CM

## 2021-10-13 DIAGNOSIS — Z20822 Contact with and (suspected) exposure to covid-19: Secondary | ICD-10-CM | POA: Diagnosis present

## 2021-10-13 DIAGNOSIS — J90 Pleural effusion, not elsewhere classified: Secondary | ICD-10-CM | POA: Diagnosis present

## 2021-10-13 DIAGNOSIS — Z8249 Family history of ischemic heart disease and other diseases of the circulatory system: Secondary | ICD-10-CM | POA: Diagnosis not present

## 2021-10-13 DIAGNOSIS — R590 Localized enlarged lymph nodes: Secondary | ICD-10-CM | POA: Diagnosis present

## 2021-10-13 DIAGNOSIS — Z79899 Other long term (current) drug therapy: Secondary | ICD-10-CM | POA: Diagnosis not present

## 2021-10-13 DIAGNOSIS — J189 Pneumonia, unspecified organism: Secondary | ICD-10-CM | POA: Diagnosis present

## 2021-10-13 DIAGNOSIS — Z9221 Personal history of antineoplastic chemotherapy: Secondary | ICD-10-CM | POA: Diagnosis not present

## 2021-10-13 DIAGNOSIS — C7931 Secondary malignant neoplasm of brain: Secondary | ICD-10-CM | POA: Diagnosis present

## 2021-10-13 LAB — CBG MONITORING, ED
Glucose-Capillary: 192 mg/dL — ABNORMAL HIGH (ref 70–99)
Glucose-Capillary: 173 mg/dL — ABNORMAL HIGH (ref 70–99)

## 2021-10-13 LAB — MAGNESIUM: Magnesium: 2.1 mg/dL (ref 1.7–2.4)

## 2021-10-13 LAB — CBC WITH DIFFERENTIAL/PLATELET
Abs Immature Granulocytes: 0.15 10*3/uL — ABNORMAL HIGH (ref 0.00–0.07)
Basophils Absolute: 0 10*3/uL (ref 0.0–0.1)
Basophils Relative: 0 %
Eosinophils Absolute: 0 10*3/uL (ref 0.0–0.5)
Eosinophils Relative: 0 %
HCT: 49.7 % — ABNORMAL HIGH (ref 36.0–46.0)
Hemoglobin: 15.2 g/dL — ABNORMAL HIGH (ref 12.0–15.0)
Immature Granulocytes: 1 %
Lymphocytes Relative: 6 %
Lymphs Abs: 1.1 10*3/uL (ref 0.7–4.0)
MCH: 24.2 pg — ABNORMAL LOW (ref 26.0–34.0)
MCHC: 30.6 g/dL (ref 30.0–36.0)
MCV: 79.1 fL — ABNORMAL LOW (ref 80.0–100.0)
Monocytes Absolute: 0.9 10*3/uL (ref 0.1–1.0)
Monocytes Relative: 5 %
Neutro Abs: 17.5 10*3/uL — ABNORMAL HIGH (ref 1.7–7.7)
Neutrophils Relative %: 88 %
Platelets: 418 10*3/uL — ABNORMAL HIGH (ref 150–400)
RBC: 6.28 MIL/uL — ABNORMAL HIGH (ref 3.87–5.11)
RDW: 14.2 % (ref 11.5–15.5)
WBC: 19.7 10*3/uL — ABNORMAL HIGH (ref 4.0–10.5)
nRBC: 0 % (ref 0.0–0.2)

## 2021-10-13 LAB — BLOOD GAS, ARTERIAL
Acid-Base Excess: 7 mmol/L — ABNORMAL HIGH (ref 0.0–2.0)
Bicarbonate: 31.5 mmol/L — ABNORMAL HIGH (ref 20.0–28.0)
Drawn by: 5155
FIO2: 28
O2 Saturation: 91.2 %
Patient temperature: 37
pCO2 arterial: 49.4 mmHg — ABNORMAL HIGH (ref 32.0–48.0)
pH, Arterial: 7.421 (ref 7.350–7.450)
pO2, Arterial: 61 mmHg — ABNORMAL LOW (ref 83.0–108.0)

## 2021-10-13 LAB — COMPREHENSIVE METABOLIC PANEL
ALT: 8 U/L (ref 0–44)
AST: 12 U/L — ABNORMAL LOW (ref 15–41)
Albumin: 2.5 g/dL — ABNORMAL LOW (ref 3.5–5.0)
Alkaline Phosphatase: 65 U/L (ref 38–126)
Anion gap: 9 (ref 5–15)
BUN: 8 mg/dL (ref 6–20)
CO2: 28 mmol/L (ref 22–32)
Calcium: 8.8 mg/dL — ABNORMAL LOW (ref 8.9–10.3)
Chloride: 96 mmol/L — ABNORMAL LOW (ref 98–111)
Creatinine, Ser: 0.56 mg/dL (ref 0.44–1.00)
GFR, Estimated: >60 mL/min (ref >60)
Glucose, Bld: 221 mg/dL — ABNORMAL HIGH (ref 70–99)
Potassium: 3.4 mmol/L — ABNORMAL LOW (ref 3.5–5.1)
Sodium: 133 mmol/L — ABNORMAL LOW (ref 135–145)
Total Bilirubin: 0.8 mg/dL (ref 0.3–1.2)
Total Protein: 7.3 g/dL (ref 6.5–8.1)

## 2021-10-13 LAB — MRSA NEXT GEN BY PCR, NASAL: MRSA by PCR Next Gen: NOT DETECTED

## 2021-10-13 LAB — HIV ANTIBODY (ROUTINE TESTING W REFLEX): HIV Screen 4th Generation wRfx: NONREACTIVE

## 2021-10-13 LAB — GLUCOSE, CAPILLARY
Glucose-Capillary: 174 mg/dL — ABNORMAL HIGH (ref 70–99)
Glucose-Capillary: 140 mg/dL — ABNORMAL HIGH (ref 70–99)

## 2021-10-13 MED ORDER — ACETAMINOPHEN 650 MG RE SUPP: 650.0000 mg | RECTAL | Status: DC | PRN

## 2021-10-13 MED ORDER — LORAZEPAM 2 MG/ML IJ SOLN: 1.0000 mg | INTRAMUSCULAR | Status: DC | PRN

## 2021-10-13 MED ORDER — LORAZEPAM 0.5 MG PO TABS
0.5000 mg | ORAL_TABLET | ORAL | Status: DC | PRN
  Administered 2021-10-13: 0.5 mg via ORAL
  Filled 2021-10-13: qty 1

## 2021-10-13 MED ORDER — SODIUM CHLORIDE 0.9 % IV SOLN
500.0000 mg | INTRAVENOUS | Status: DC
  Administered 2021-10-13: 500 mg via INTRAVENOUS
  Filled 2021-10-13 (×2): qty 5

## 2021-10-13 MED ORDER — ALBUTEROL SULFATE (2.5 MG/3ML) 0.083% IN NEBU
2.5000 mg | INHALATION_SOLUTION | RESPIRATORY_TRACT | Status: DC | PRN
  Administered 2021-10-14 – 2021-10-15 (×3): 2.5 mg via RESPIRATORY_TRACT
  Filled 2021-10-13 (×3): qty 3

## 2021-10-13 MED ORDER — ENOXAPARIN SODIUM 30 MG/0.3ML IJ SOSY
30.0000 mg | PREFILLED_SYRINGE | INTRAMUSCULAR | Status: DC
  Administered 2021-10-13 – 2021-10-14 (×2): 30 mg via SUBCUTANEOUS
  Filled 2021-10-13 (×3): qty 0.3

## 2021-10-13 MED ORDER — ONDANSETRON HCL 4 MG PO TABS: 4.0000 mg | ORAL_TABLET | Freq: Four times a day (QID) | ORAL | Status: DC | PRN

## 2021-10-13 MED ORDER — METHADONE HCL 10 MG PO TABS
5.0000 mg | ORAL_TABLET | Freq: Three times a day (TID) | ORAL | Status: DC
  Administered 2021-10-13 – 2021-10-14 (×4): 5 mg via ORAL
  Filled 2021-10-13 (×4): qty 1

## 2021-10-13 MED ORDER — POTASSIUM CHLORIDE 10 MEQ/100ML IV SOLN
10.0000 meq | INTRAVENOUS | Status: AC
  Administered 2021-10-13 (×3): 10 meq via INTRAVENOUS
  Filled 2021-10-13 (×3): qty 100

## 2021-10-13 MED ORDER — INSULIN ASPART 100 UNIT/ML IJ SOLN
0.0000 [IU] | INTRAMUSCULAR | Status: DC
  Administered 2021-10-13: 2 [IU] via SUBCUTANEOUS
  Administered 2021-10-13 – 2021-10-14 (×4): 3 [IU] via SUBCUTANEOUS
  Administered 2021-10-15: 10:00:00 2 [IU] via SUBCUTANEOUS

## 2021-10-13 MED ORDER — HALOPERIDOL LACTATE 5 MG/ML IJ SOLN
5.0000 mg | Freq: Four times a day (QID) | INTRAMUSCULAR | Status: DC | PRN
  Administered 2021-10-14: 5 mg via INTRAVENOUS
  Filled 2021-10-13: qty 1

## 2021-10-13 MED ORDER — BENZONATATE 100 MG PO CAPS
200.0000 mg | ORAL_CAPSULE | Freq: Three times a day (TID) | ORAL | Status: DC | PRN
  Administered 2021-10-13: 200 mg via ORAL
  Filled 2021-10-13 (×3): qty 2

## 2021-10-13 MED ORDER — ONDANSETRON HCL 4 MG/2ML IJ SOLN: 4.0000 mg | Freq: Four times a day (QID) | INTRAMUSCULAR | Status: DC | PRN

## 2021-10-13 MED ORDER — OXYCODONE HCL 20 MG PO TABS: 1.0000 | ORAL_TABLET | ORAL | Status: DC | PRN

## 2021-10-13 MED ORDER — PREDNISONE 10 MG PO TABS
10.0000 mg | ORAL_TABLET | Freq: Every day | ORAL | Status: DC
  Administered 2021-10-13 – 2021-10-14 (×2): 10 mg via ORAL
  Filled 2021-10-13 (×3): qty 1

## 2021-10-13 MED ORDER — OXYCODONE HCL 5 MG PO TABS
10.0000 mg | ORAL_TABLET | ORAL | Status: DC | PRN
  Administered 2021-10-13: 10 mg via ORAL
  Filled 2021-10-13: qty 2

## 2021-10-13 MED ORDER — LACTATED RINGERS IV SOLN: INTRAVENOUS | Status: AC

## 2021-10-13 NOTE — Assessment & Plan Note (Addendum)
Occasional high readings noted.  Noted to have sinus tachycardia. Potassium to be repleted.

## 2021-10-13 NOTE — Progress Notes (Addendum)
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  Noted to be hypoxic with saturations in the 70s.  Nursing following multiple levels along with a clear plastic bag with white substance in it.  This was given to security.  Patient was placed on BiPAP.  Hospitalize for further management.  CT scan of the chest raise concern for pneumonia.  Consultants: Palliative care  Procedures: None    Subjective: Patient is somnolent.  Noted to be on BiPAP.  Easily arousable.  Want something to eat and drink.  But then goes right back to sleep.    Assessment/Plan:  * Acute on chronic respiratory failure with hypoxia (HCC) It appears that patient uses oxygen at home as well.  Came in with saturations in the 70s.  Found to have pneumonia on imaging studies.  Started on antibiotics.  Currently on BiPAP.  Continue to monitor closely.  Multiple attempts at getting ABG not successful.  Venous blood gas showed pH of 7.47 with PCO2 of 44. CT angiogram did not show any PE but small pneumothorax was noted.  Moderate right pleural effusion was noted.  May need to consider thoracentesis depending on her respiratory status.  Pneumonia- (present on admission) In the setting of known malignancy.  WBC noted to be elevated.  Lactic acid was also mildly elevated.  Started on broad-spectrum antibiotics with vancomycin and cefepime and flagyl. Will add azithromycin.  Follow-up on cultures.  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  Seen by Dr. Lorenso Courier with  medical oncology in the past 2 years.  Currently under hospice care.  Noted to be on multiple different long-term pain medications including methadone as well as fentanyl patch.  Also noted to be in Oxy IR.  Very somnolent this morning.  Use medications judiciously.    Accelerated hypertension Not noted to be on any antihypertensives.  Used to be on propranolol previously but not on it currently.  Monitor blood pressures closely.  Use as needed agents if blood pressure remains elevated  Hyperglycemia- (present on admission) Possibly from steroids.  She is noted to be on prednisone 10 mg daily.  Check HbA1c.  Check CBGs and placed on SSI.  Electrolyte abnormality- (present on admission) Hyponatremia Hypokalemia  Monitor sodium levels closely.  Replace potassium.  Magnesium was 2.1.  IV drug user- (present on admission) Longstanding history of same.  Urine drug screen is pending  Cocaine abuse ([place])- (present on admission) Longstanding history of same     DVT Prophylaxis: Lovenox Code Status: Full code Family Communication: Discussed with patient.  No family at bedside.  Will update sister Disposition Plan: Hopefully return home when improved  Status is: Observation  The patient will require care spanning > 2 midnights and should be moved to inpatient because: Acute respiratory failure with hypoxia        Medications: Scheduled:  enoxaparin (LOVENOX) injection  30 mg Subcutaneous Q24H   insulin aspart  0-15 Units Subcutaneous Q4H   methadone  5 mg Oral TID   predniSONE  10 mg Oral Daily   Continuous:  azithromycin  ceFEPime (MAXIPIME) IV 2 g (10/13/21 1327)   lactated ringers 75 mL/hr at 10/13/21 0916   metronidazole Stopped (10/13/21 0910)   EHM:[id number], albuterol, benzonatate, haloperidol lactate, LORazepam, ondansetron **OR** ondansetron (ZOFRAN) IV, oxyCODONE  Antibiotics: Anti-infectives (From admission, onward)    Start     Dose/Rate Route Frequency  Ordered Stop   10/13/21 1345  azithromycin (ZITHROMAX) 500 mg in sodium chloride 0.9 % 250 mL IVPB        500 mg 250 mL/hr over 60 Minutes Intravenous Every 24 hours 10/13/21 1337 11-14-21 1344   10/13/21 1000  vancomycin (VANCOREADY) IVPB 750 mg/150 mL  Status:  Discontinued        750 mg 150 mL/hr over 60 Minutes Intravenous Every 12 hours 10/12/21 2043 10/13/21 1337   10/13/21 0600  ceFEPIme (MAXIPIME) 2 g in sodium chloride 0.9 % 100 mL IVPB        2 g 200 mL/hr over 30 Minutes Intravenous Every 8 hours 10/12/21 2239     10/12/21 2245  ceFEPIme (MAXIPIME) 2 g in sodium chloride 0.9 % 100 mL IVPB  Status:  Discontinued        2 g 200 mL/hr over 30 Minutes Intravenous Every 8 hours 10/12/21 2238 10/12/21 2239   10/12/21 2045  vancomycin (VANCOREADY) IVPB 1500 mg/300 mL        1,500 mg 150 mL/hr over 120 Minutes Intravenous  Once 10/12/21 2043 10/13/21 0203   10/12/21 2015  vancomycin (VANCOCIN) IVPB 1000 mg/200 mL premix  Status:  Discontinued        1,000 mg 200 mL/hr over 60 Minutes Intravenous  Once 10/12/21 2003 10/12/21 2043   10/12/21 2000  metroNIDAZOLE (FLAGYL) IVPB 500 mg        500 mg 100 mL/hr over 60 Minutes Intravenous Every 12 hours 10/12/21 1945     10/12/21 1945  ceFEPIme (MAXIPIME) 2 g in sodium chloride 0.9 % 100 mL IVPB        2 g 200 mL/hr over 30 Minutes Intravenous  Once 10/12/21 1944 10/12/21 2126       Objective:  Vital Signs  Vitals:   10/13/21 0800 10/13/21 0900 10/13/21 1000 10/13/21 1115  BP: (!) 155/101 (!) 148/76 (!) 151/86 (!) 144/80  Pulse: (!) 109 (!) 107 (!) 103 99  Resp: (!) 22 16 18 10   Temp:   97.9 F (36.6 C)   TempSrc:   Oral   SpO2: 96% 95% 99% 100%    Intake/Output Summary (Last 24 hours) at 10/13/2021 1338 Last data filed at 10/13/2021 9476 Gross per 24 hour  Intake 1102.57 ml  Output --  Net 1102.57 ml   There were no vitals filed for this visit.   General appearance: Awake alert.  In no distress Resp: Tachypneic.  No  use of accessory muscles.  Crackles bilateral bases.  No wheezing or rhonchi. Cardio: S1-S2 is normal regular.  No S3-S4.  No rubs murmurs or bruit GI: Abdomen is soft.  Nontender nondistended.  Bowel sounds are present normal.  No masses organomegaly Extremities: No edema.  Noted to be moving all of her extremities Neurologic:  No focal neurological deficits.    Lab Results:  Data Reviewed: I have personally reviewed labs and imaging study reports  CBC: Recent Labs  Lab 10/12/21 1603 10/12/21 1825 10/13/21 0437  WBC 20.8*  --  19.7*  NEUTROABS 18.2*  --  17.5*  HGB 16.7* 17.7* 15.2*  HCT 53.1* 52.0* 49.7*  MCV 78.4*  --  79.1*  PLT 492*  --  418*    Basic Metabolic Panel: Recent Labs  Lab 10/12/21 1603 10/12/21 1825 10/13/21 0437  NA 133* 125* 133*  K 3.5 >8.5* 3.4*  CL 89*  --  96*  CO2 26  --  28  GLUCOSE 208*  --  221*  BUN <5*  --  8  CREATININE 0.72  --  0.56  CALCIUM 9.1  --  8.8*  MG  --   --  2.1    GFR: CrCl cannot be calculated (Unknown ideal weight.).  Liver Function Tests: Recent Labs  Lab 10/12/21 1603 10/13/21 0437  AST 20 12*  ALT 9 8  ALKPHOS 77 65  BILITOT 1.2 0.8  PROT 8.0 7.3  ALBUMIN 2.9* 2.5*     CBG: Recent Labs  Lab 10/12/21 1606 10/13/21 1004 10/13/21 1114  GLUCAP 245* 192* 173*     Recent Results (from the past 240 hour(s))  Resp Panel by RT-PCR (Flu A&B, Covid) Nasopharyngeal Swab     Status: None   Collection Time: 10/12/21  4:45 PM   Specimen: Nasopharyngeal Swab; Nasopharyngeal(NP) swabs in vial transport medium  Result Value Ref Range Status   SARS Coronavirus 2 by RT PCR NEGATIVE NEGATIVE Final    Comment: (NOTE) SARS-CoV-2 target nucleic acids are NOT DETECTED.  The SARS-CoV-2 RNA is generally detectable in upper respiratory specimens during the acute phase of infection. The lowest concentration of SARS-CoV-2 viral copies this assay can detect is 138 copies/mL. A negative result does not preclude  SARS-Cov-2 infection and should not be used as the sole basis for treatment or other patient management decisions. A negative result may occur with  improper specimen collection/handling, submission of specimen other than nasopharyngeal swab, presence of viral mutation(s) within the areas targeted by this assay, and inadequate number of viral copies(<138 copies/mL). A negative result must be combined with clinical observations, patient history, and epidemiological information. The expected result is Negative.  Fact Sheet for Patients:  EntrepreneurPulse.com.au  Fact Sheet for Healthcare Providers:  IncredibleEmployment.be  This test is no t yet approved or cleared by the [place] FDA and  has been authorized for detection and/or diagnosis of SARS-CoV-2 by FDA under an Emergency Use Authorization (EUA). This EUA will remain  in effect (meaning this test can be used) for the duration of the COVID-19 declaration under Section 564(b)(1) of the Act, 21 U.S.C.section 360bbb-3(b)(1), unless the authorization is terminated  or revoked sooner.       Influenza A by PCR NEGATIVE NEGATIVE Final   Influenza B by PCR NEGATIVE NEGATIVE Final    Comment: (NOTE) The Xpert Xpress SARS-CoV-2/FLU/RSV plus assay is intended as an aid in the diagnosis of influenza from Nasopharyngeal swab specimens and should not be used as a sole basis for treatment. Nasal washings and aspirates are unacceptable for Xpert Xpress SARS-CoV-2/FLU/RSV testing.  Fact Sheet for Patients: EntrepreneurPulse.com.au  Fact Sheet for Healthcare Providers: IncredibleEmployment.be  This test is not yet approved or cleared by the [place] FDA and has been authorized for detection and/or diagnosis of SARS-CoV-2 by FDA under an Emergency Use Authorization (EUA). This EUA will remain in effect (meaning this test can be used) for the duration of  the COVID-19 declaration under Section 564(b)(1) of the Act, 21 U.S.C. section 360bbb-3(b)(1), unless the authorization is terminated or revoked.  Performed at [facility], [place] [address], [place], [place] [address]   Blood culture (routine x 2)  Status: None (Preliminary result)   Collection Time: 10/12/21  5:49 PM   Specimen: BLOOD LEFT HAND  Result Value Ref Range Status   Specimen Description BLOOD LEFT HAND  Final   Special Requests   Final    BOTTLES DRAWN AEROBIC AND ANAEROBIC Blood Culture adequate volume   Culture   Final    NO GROWTH < 24 HOURS Performed at [facility], [address] [place], [place] [address]    Report Status PENDING  Incomplete  Blood culture (routine x 2)     Status: None (Preliminary result)   Collection Time: 10/12/21  6:13 PM   Specimen: BLOOD LEFT FOREARM  Result Value Ref Range Status   Specimen Description BLOOD LEFT FOREARM  Final   Special Requests   Final    BOTTLES DRAWN AEROBIC AND ANAEROBIC Blood Culture adequate volume   Culture   Final    NO GROWTH < 24 HOURS Performed at [facility], [place] [address], [place], [place] [address]    Report Status PENDING  Incomplete  MRSA Next Gen by PCR, Nasal     Status: None   Collection Time: 10/13/21  8:59 AM   Specimen: Nasal Mucosa; Nasal Swab  Result Value Ref Range Status   MRSA by PCR Next Gen NOT DETECTED NOT DETECTED Final    Comment: (NOTE) The GeneXpert MRSA Assay (FDA approved for NASAL specimens only), is one component of a comprehensive MRSA colonization surveillance program. It is not intended to diagnose MRSA infection nor to guide or monitor treatment for MRSA infections. Test performance is not FDA approved in patients less than 50 years old. Performed at [facility], [place] [address] [place], [place] [address]       Radiology Studies: CT Angio Chest PE W and/or Wo Contrast  Result Date: 10/12/2021 CLINICAL DATA:  Pulmonary embolism (PE)  suspected, high prob. Shortness of breath EXAM: CT ANGIOGRAPHY CHEST WITH CONTRAST TECHNIQUE: Multidetector CT imaging of the chest was performed using the standard protocol during bolus administration of intravenous contrast. Multiplanar CT image reconstructions and MIPs were obtained to evaluate the vascular anatomy. RADIATION DOSE REDUCTION: This exam was performed according to the departmental dose-optimization program which includes automated exposure control, adjustment of the mA and/or kV according to patient size and/or use of iterative reconstruction technique. CONTRAST:  75m OMNIPAQUE IOHEXOL 350 MG/ML SOLN COMPARISON:  09/13/2020 FINDINGS: Cardiovascular: No filling defects in the pulmonary arteries to suggest pulmonary emboli. Heart is normal size. Aorta is normal caliber. Mediastinum/Nodes: Significantly worsening mediastinal adenopathy. Conglomerate subcarinal nodal mass has a short axis diameter of 4.7 cm. Mild bilateral hilar adenopathy is similar to prior study. Lungs/Pleura: Worsening masses in the right upper lobe. Central right upper lobe mass currently measures 5.3 x 4.7 cm compared to 2.0 x 1.8 cm previously. Peripheral right upper lobe mass lesion measures 3.7 x 3.6 cm compared to 2.8 x 2.1 cm previously. Moderate right pleural effusion. Small right side pneumothorax noted, best seen overlying the peripheral right upper lobe mass. Moderate centrilobular emphysema. Airspace disease seen bilaterally in the lower lobes, left greater than right as well as in the right upper lobe. Findings concerning for pneumonia. Upper Abdomen: Imaging into the upper abdomen demonstrates no acute findings. Musculoskeletal: Chest wall soft tissues are unremarkable. No acute bony abnormality. Review of the MIP images confirms the above findings. IMPRESSION: No evidence of pulmonary embolus. Worsening masses in the right upper lobe concerning for worsening lung cancer. Markedly  worsening mediastinal adenopathy.  Moderate right pleural effusion. Small right pneumothorax of unknown etiology. Patchy bilateral airspace opacities, left greater than right concerning for pneumonia. Aortic Atherosclerosis (ICD10-I70.0) and Emphysema (ICD10-J43.9). Electronically Signed   By: Rolm Baptise M.D.   On: 10/12/2021 21:01   DG Chest Port 1 View  Result Date: 10/12/2021 CLINICAL DATA:  Shortness of breath EXAM: PORTABLE CHEST 1 VIEW COMPARISON:  07/13/2020 FINDINGS: Right Port-A-Cath remains in place, unchanged. Heart is normal size. Patchy bilateral airspace disease, worsening since prior study. Rounded masslike opacity in the right mid lung again noted suspect small right effusion. Probable mediastinal and hilar adenopathy with bilateral hilar fullness. IMPRESSION: Right mid lung mass with bilateral hilar adenopathy and mediastinal adenopathy suspected. Patchy bilateral airspace disease concerning for pneumonia. Small right effusion. Electronically Signed   By: Rolm Baptise M.D.   On: 10/12/2021 17:19       LOS: 0 days   [facility] Pager on [email or web address]  10/13/2021, 1:38 PM

## 2021-10-13 NOTE — ED Notes (Addendum)
Pt found in room with BiPAP tubing disconnected from BiPAP mask. Oxygen saturation 80%. Tubing reconnected, RN encouraged pt to take deep breaths. Respiratory therapist called to bedside. Oxygen saturation increased to 96%. Dr. Bridgett Larsson made aware of findings.

## 2021-10-13 NOTE — Hospital Course (Addendum)
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  Noted to be hypoxic with saturations in the 70s.  Nursing following multiple levels along with a clear plastic bag with white substance in it.  This was given to security.  Patient was placed on BiPAP.  Hospitalize for further management.  CT scan of the chest raised concern for pneumonia.  Started on IV antibiotics.  Remains somnolent.  Prognosis is guarded.  Overnight events noted.  Did not tolerate BiPAP.  Critical care medicine has been consulted.

## 2021-10-13 NOTE — Progress Notes (Signed)
°   10/13/21 2000  Assess: MEWS Score  Temp 97.8 F (36.6 C)  BP 127/79  Pulse Rate (!) 108  ECG Heart Rate (!) 108  Resp 18  Level of Consciousness Responds to Voice  SpO2 92 %  O2 Device Nasal Cannula  O2 Flow Rate (L/min) 3 L/min  Assess: MEWS Score  MEWS Temp 0  MEWS Systolic 0  MEWS Pulse 1  MEWS RR 0  MEWS LOC 1  MEWS Score 2  MEWS Score Color Yellow  Assess: if the MEWS score is Yellow or Red  Were vital signs taken at a resting state? Yes  Focused Assessment No change from prior assessment  Early Detection of Sepsis Score *See Row Information* Low  MEWS guidelines implemented *See Row Information* Yes  Treat  MEWS Interventions Escalated (See documentation below)  Pain Scale 0-10  Pain Score 0  Patients Stated Pain Goal 0  Take Vital Signs  Increase Vital Sign Frequency  Yellow: Q 2hr X 2 then Q 4hr X 2, if remains yellow, continue Q 4hrs  Escalate  MEWS: Escalate Yellow: discuss with charge nurse/RN and consider discussing with provider and RRT  Notify: Charge Nurse/RN  Name of Charge Nurse/RN Notified Jill, RN  Date Charge Nurse/RN Notified 10/13/21  Time Charge Nurse/RN Notified 2100  Document  Patient Outcome Other (Comment) (pt remains stable)  Progress note created (see row info) Yes

## 2021-10-13 NOTE — Progress Notes (Signed)
RT unable to obtain ABG after two attempts. RN and attending physician have been notified. Patient is currently off bipap but due to WOB will have to be placed back on.

## 2021-10-13 NOTE — ED Notes (Signed)
Provided pts sister update

## 2021-10-13 NOTE — Progress Notes (Addendum)
Pt responds and wakes up when spoken to. Pt is aware of place, name and no distress noted at this time.  ABG drawn, sent to the lab.  Normal ABG results, RN aware

## 2021-10-13 NOTE — Assessment & Plan Note (Addendum)
Hyponatremia Hypokalemia  Sodium level has improved.  Potassium remains low and will be repleted.  Magnesium was 1.7 yesterday.

## 2021-10-13 NOTE — Assessment & Plan Note (Addendum)
Possibly from steroids.  She is noted to be on prednisone 10 mg daily.  Continue SSI.  CBGs have improved.  HbA1c is 10.8.  Seems to have diabetes mellitus type 2.  Home medication list was reviewed again.  Not noted to be on any glucose lowering agents at home.

## 2021-10-14 ENCOUNTER — Inpatient Hospital Stay (HOSPITAL_COMMUNITY): Payer: Medicaid Other

## 2021-10-14 DIAGNOSIS — E878 Other disorders of electrolyte and fluid balance, not elsewhere classified: Secondary | ICD-10-CM | POA: Diagnosis not present

## 2021-10-14 DIAGNOSIS — Z789 Other specified health status: Secondary | ICD-10-CM

## 2021-10-14 DIAGNOSIS — R638 Other symptoms and signs concerning food and fluid intake: Secondary | ICD-10-CM

## 2021-10-14 DIAGNOSIS — C7B8 Other secondary neuroendocrine tumors: Secondary | ICD-10-CM

## 2021-10-14 DIAGNOSIS — G9341 Metabolic encephalopathy: Secondary | ICD-10-CM | POA: Diagnosis present

## 2021-10-14 DIAGNOSIS — J9621 Acute and chronic respiratory failure with hypoxia: Secondary | ICD-10-CM | POA: Diagnosis not present

## 2021-10-14 DIAGNOSIS — F1410 Cocaine abuse, uncomplicated: Secondary | ICD-10-CM | POA: Diagnosis not present

## 2021-10-14 DIAGNOSIS — J189 Pneumonia, unspecified organism: Secondary | ICD-10-CM | POA: Diagnosis not present

## 2021-10-14 DIAGNOSIS — C7A8 Other malignant neuroendocrine tumors: Secondary | ICD-10-CM

## 2021-10-14 DIAGNOSIS — R531 Weakness: Secondary | ICD-10-CM

## 2021-10-14 DIAGNOSIS — Z711 Person with feared health complaint in whom no diagnosis is made: Secondary | ICD-10-CM

## 2021-10-14 LAB — GLUCOSE, CAPILLARY
Glucose-Capillary: 107 mg/dL — ABNORMAL HIGH (ref 70–99)
Glucose-Capillary: 111 mg/dL — ABNORMAL HIGH (ref 70–99)
Glucose-Capillary: 111 mg/dL — ABNORMAL HIGH (ref 70–99)
Glucose-Capillary: 111 mg/dL — ABNORMAL HIGH (ref 70–99)
Glucose-Capillary: 116 mg/dL — ABNORMAL HIGH (ref 70–99)
Glucose-Capillary: 178 mg/dL — ABNORMAL HIGH (ref 70–99)

## 2021-10-14 LAB — COMPREHENSIVE METABOLIC PANEL
ALT: 10 U/L (ref 0–44)
AST: 19 U/L (ref 15–41)
Albumin: 2.4 g/dL — ABNORMAL LOW (ref 3.5–5.0)
Alkaline Phosphatase: 52 U/L (ref 38–126)
Anion gap: 16 — ABNORMAL HIGH (ref 5–15)
BUN: 10 mg/dL (ref 6–20)
CO2: 23 mmol/L (ref 22–32)
Calcium: 9 mg/dL (ref 8.9–10.3)
Chloride: 96 mmol/L — ABNORMAL LOW (ref 98–111)
Creatinine, Ser: 0.69 mg/dL (ref 0.44–1.00)
GFR, Estimated: >60 mL/min (ref >60)
Glucose, Bld: 129 mg/dL — ABNORMAL HIGH (ref 70–99)
Potassium: 3.8 mmol/L (ref 3.5–5.1)
Sodium: 135 mmol/L (ref 135–145)
Total Bilirubin: 0.8 mg/dL (ref 0.3–1.2)
Total Protein: 6.9 g/dL (ref 6.5–8.1)

## 2021-10-14 LAB — CBC
HCT: 45.3 % (ref 36.0–46.0)
Hemoglobin: 13.7 g/dL (ref 12.0–15.0)
MCH: 24.2 pg — ABNORMAL LOW (ref 26.0–34.0)
MCHC: 30.2 g/dL (ref 30.0–36.0)
MCV: 79.9 fL — ABNORMAL LOW (ref 80.0–100.0)
Platelets: 411 10*3/uL — ABNORMAL HIGH (ref 150–400)
RBC: 5.67 MIL/uL — ABNORMAL HIGH (ref 3.87–5.11)
RDW: 14.3 % (ref 11.5–15.5)
WBC: 19.4 10*3/uL — ABNORMAL HIGH (ref 4.0–10.5)
nRBC: 0 % (ref 0.0–0.2)

## 2021-10-14 LAB — MAGNESIUM: Magnesium: 1.7 mg/dL (ref 1.7–2.4)

## 2021-10-14 MED ORDER — HALOPERIDOL LACTATE 5 MG/ML IJ SOLN: 2.0000 mg | Freq: Four times a day (QID) | INTRAMUSCULAR | Status: DC | PRN

## 2021-10-14 MED ORDER — FUROSEMIDE 10 MG/ML IJ SOLN
20.0000 mg | Freq: Once | INTRAMUSCULAR | Status: AC
  Administered 2021-10-14: 20 mg via INTRAVENOUS
  Filled 2021-10-14: qty 2

## 2021-10-14 MED ORDER — SODIUM CHLORIDE 0.9 % IV SOLN
100.0000 mg | Freq: Two times a day (BID) | INTRAVENOUS | Status: DC
  Administered 2021-10-14 – 2021-10-15 (×2): 100 mg via INTRAVENOUS
  Filled 2021-10-14 (×3): qty 100

## 2021-10-14 MED ORDER — METHADONE HCL 5 MG PO TABS
5.0000 mg | ORAL_TABLET | Freq: Three times a day (TID) | ORAL | Status: DC
  Filled 2021-10-14: qty 1

## 2021-10-14 MED ORDER — OXYCODONE HCL 5 MG PO TABS: 5.0000 mg | ORAL_TABLET | ORAL | Status: DC | PRN

## 2021-10-14 NOTE — Assessment & Plan Note (Addendum)
Discussions held with patient's sons as well as sisters.  Palliative care is also following.  Plan is for family meeting this afternoon at 1:30.

## 2021-10-14 NOTE — Progress Notes (Signed)
Daily Progress Note   Patient Name: Holly Weber       Date: 10/14/2021 DOB: May 13, 1966  Age: 56 y.o. MRN#: [id number] Attending Physician: Bonnielee Haff, MD Primary Care Physician: Patient, No Pcp Per (Inactive) Admit Date: 10/12/2021  Reason for Consultation/Follow-up: Establishing goals of care  Subjective: Chart review performed. Discussed case with Holly Signs, NP/PMT.  Called sister/Holly Weber to coordinate another family meeting for tomorrow 1/24. Emotional support provided as Holly Weber reflects on how difficult the last year has been for patient and family - due to the passing of other family members. Holly Weber feels patient "has suffered enough from drugs and cancer." She feels "patient has not been doing well for some time" and does not have a good quality of life. Holly Weber is supportive of transitioning patient to full comfort care/returning to hospice care. Holly Weber is available to meet at 1:30p tomorrow - she tells me to verify time with son at bedside.  Received report from primary RN - no acute concerns. Reports patient is lethargic. She has poor PO intake - drinking small amounts, not eating.   Went to visit patient at bedside - son/Holly Weber is present. Patient is lying in bed asleep - she briefly wakes to voice/gentle touch but promptly falls back asleep. She is not able to participate in conversation or make complex medical decisions. No Weber or non-verbal gestures of pain or discomfort noted. No respiratory distress, increased work of breathing, or secretions noted. She is on venimask.  Emotional support provided to son/Holly Weber. Reviewed conversation with Holly Signs, NP yesterday. Gently reviewed again patient's current acute medical situation/poor prognosis. Holly Weber confirms patient was enrolled with hospice  services prior to admission; however, he is not sure which organization. Reviewed aggressive medical interventions vs comfort care in light of patient's goals of care. Reviewed that patient was agreeable to hospice services, which would have been her goals/wishes. Discussed that it offers a holistic approach to care in the setting of end-stage illness, and is about supporting the patient where they are allowing nature to take it's course while trying to prevent rehospitalization.  Holly Weber states that on discharge, they would not want the patient going back to her previous residence. Reviewed that if patient survives this hospitalization, she would likely need 24/7 supervision/assistance. He asked if hospice had facilities. Holly Weber confirms they will likely continue hospice  at discharge. Education provided on the difference between home vs residential hospice - patient would be residential hospice appropriate if family interested. Also discussed LTCF with hospice support per [facility] request.  Holly Weber would like to re-discuss information given to him today, tomorrow at family meeting. Holly Weber is agreeable to meeting at 1:30p tomorrow and will notify his brother/Holly Weber.  All questions and concerns addressed. Encouraged to call with questions and/or concerns. PMT card provided.   Length of Stay: 1  Current Medications: Scheduled Meds:   enoxaparin (LOVENOX) injection  30 mg Subcutaneous Q24H   insulin aspart  0-15 Units Subcutaneous Q4H   [START ON 10/15/2021] methadone  5 mg Oral TID   predniSONE  10 mg Oral Daily    Continuous Infusions:  ceFEPime (MAXIPIME) IV 2 g (10/14/21 1405)   doxycycline (VIBRAMYCIN) IV     metronidazole 500 mg (10/14/21 1401)    PRN Meds: acetaminophen, albuterol, benzonatate, haloperidol lactate, ondansetron **OR** ondansetron (ZOFRAN) IV, oxyCODONE  Physical Exam Vitals and nursing note reviewed.  Constitutional:      General: She is not in acute  distress.    Appearance: She is cachectic. She is ill-appearing.  Pulmonary:     Effort: No respiratory distress.  Skin:    General: Skin is warm and dry.  Neurological:     Mental Status: She is lethargic.     Motor: Weakness present.  Psychiatric:        Speech: She is noncommunicative.            Vital Weber: BP 125/81    Pulse (!) 118    Temp 98.4 F (36.9 C)    Resp 18    SpO2 90%  SpO2: SpO2: 90 % O2 Device: O2 Device: Venturi Mask O2 Flow Rate: O2 Flow Rate (L/min): 14 L/min  Intake/output summary:  Intake/Output Summary (Last 24 hours) at 10/14/2021 1631 Last data filed at 10/14/2021 0500 Gross per 24 hour  Intake 550.5 ml  Output --  Net 550.5 ml   LBM: Last BM Date: 10/12/21 Baseline Weight:   Most recent weight:         Palliative Assessment/Data: PPS 20%      Patient Active Problem List   Diagnosis Date Noted   Acute metabolic encephalopathy 34/19/6222   Electrolyte abnormality 10/13/2021   Acute respiratory failure ([place]) 10/13/2021   Pressure injury of skin 10/13/2021   Acute on chronic respiratory failure with hypoxia ([place]) 10/12/2021   Sinus tachycardia 07/18/2020   Colitis 07/17/2020   Hospice care patient - yet remains a FULL CODE    Seizure ([place]) 07/13/2020   Hyperthyroidism 07/13/2020   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 She was hypoxic with O2 saturation in the 70's and  subsequently placed on BiPAP in the ED. CT chest concerning for pneumonia and also showed significantly worsening mediastinal adenopathy as well as worsening masses in the right upper lobe concerning for worsening lung cancer.   Cancer diagnosed in July 2021. She received 1 cycle of chemotherapy, but did not have interest in pursuing further treatment. In October 2021, sh was released early from prison on medical leave.  She established with [facility], but her last oncology visit was December 2021.   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 IV drug user Hospice care patient  Recommendations/Plan: Continue current medical treatment Continue full code as previously documented Family meeting for continued [place] scheduled for tomorrow 1/24 at 1:30p Patient would likely be residential hospice appropriate if family agreeable PMT will continue to follow and support holistically  Goals of Care and Additional Recommendations: Limitations on Scope of Treatment: Full Scope Treatment  Code Status:    Code Status Orders  (From admission, onward)           Start     Ordered   10/13/21 0437  Full code  Continuous        10/13/21 0436           Code Status History     Date Active Date Inactive Code Status Order ID Comments User Context   07/17/2020 2326 07/18/2020 1839 Full Code [id number]  [facility], Briant Cedar, DO ED   07/13/2020 0825 07/14/2020 2036 Full Code [id number]  Karmen Bongo, MD ED   10/19/2018 0328 10/21/2018 2000 Full Code [id number]  Toy Baker, MD ED   12/27/2016 0544 12/30/2016 1631 Full Code [id number]  Etta Quill, DO ED   12/22/2016 0903 12/24/2016 0724 Full Code [id number]  Waldemar Dickens, MD ED   12/13/2016 1701 12/22/2016 0122 Full Code [id number]  Maren Reamer, MD ED       Prognosis:  Poor in the setting of multiple comorbidities and metastatic neuroendocrine cancer  Discharge Planning: To Be Determined  Care plan was discussed with primary RN, patient's sister, patient's son  Thank you for allowing the Palliative Medicine Team to assist in the care of this patient.   Total Time 70 minutes Prolonged Time Billed  yes       Greater than 50%  of this time was spent counseling and coordinating care related to the above assessment and plan.  Lin Landsman, NP  Please contact Palliative Medicine Team phone at [phone] for questions and concerns.

## 2021-10-14 NOTE — Consult Note (Addendum)
Consultation Note Date: 10/14/2021   Patient Name: Holly Weber  DOB: 1966/03/20  MRN: [id number]  Age / Sex: 56 y.o., female  PCP: Patient, No Pcp Per (Inactive) Referring Physician: Bonnielee Haff, MD  Reason for Consultation: Establishing goals of care  HPI/Patient Profile: 56 y.o. 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 She was hypoxic with O2 saturation in the 70's and subsequently placed on BiPAP in the ED. CT chest concerning for pneumonia and also showed significantly worsening mediastinal adenopathy as well as worsening masses in the right upper lobe concerning for worsening lung cancer.  Cancer diagnosed in July 2021. She received 1 cycle of chemotherapy, but did not have interest in pursuing further treatment. In 2020/07/28, sh was released early from prison on medical leave.  She established with [facility], but her last oncology visit was December 2021.   Clinical Assessment and Goals of Care: I have reviewed medical records including EPIC notes, labs and imaging, and went to see patient at bedside. She is currently off BiPAP, but remains somnolent. She briefly opens her eyes to voice but does not follow any commands.  I met in the Holly Weber conference room with family to discuss diagnosis, prognosis, GOC, EOL wishes, disposition, and options. Present are patient's sister Holly Weber (along with her husband and son), patient's aunt and her daughter (patient's cousin).   I introduced Palliative Medicine as specialized medical care for people living with serious illness. It focuses on providing relief from the symptoms and stress of a serious illness.   We discussed a brief life review of the patient.  Holly Weber is not married. She has 7 children, but family reports she did not raise any of them. She has struggled with substance abuse most of her life. She has been incarcerated multiple times. Family reports that 2 sons are available and involved - Holly Weber and Holly Weber.   [name]'s dad passed away in 2020/07/28. There are 2 sisters - Holly Weber and Holly Weber. Casmira and Holly Weber share the same father (but had different mothers). Holly Weber and Holly Weber had the same father and mother.   Holly Weber has been living with Holly Weber since their father passed away. Family reports that Holly Weber also uses drugs (mostly crack/cocaine). Holly Weber called Holly Weber in the middle of our meeting and placed placed on speaker phone in an attempt to include her in [name] discussion. While attempting to discuss patient's diagnosis/prognosis, Holly Weber became hostile and verbally abusive toward this NP. She was not speaking coherently and seemed to be under the influence of drugs.   Family is unsure of patient's baseline functional and nutritional status. They reports that she appeared to be having difficulty walking at her father's funeral a few months ago. They also report that Holly Weber is supposed to help Holly Weber with bathing/hygiene, but they suspect she does not.    We discussed patient's current  illness and what it means in the larger context of his/her ongoing co-morbidities. I provided education on the natural disease trajectory of advanced cancer, emphasizing that functional status is generally preserved until late in the disease course, followed by a precipitous decline over weeks to months. Discussed that the onset of decline usually suggests worsening disease.   I attempted to elicit values and goals of care important to the patient. Holly Weber reports that Holly Weber has recently been telling her that "she's dying" and that she is suffering.    After I had already been with family for over an hour, sons Holly Weber and Holly Weber arrived to the conference room. I provided education on patient's  diagnosis and prognosis, emphasizing that metastatic cancer is a non-curable condition. I expressed concern that she may is high risk for decompensation and may not survive this hospitalization. Holly Weber is not accepting of this information, and states that his goal for his mother is for her to "live longer".  The difference between full scope medical intervention and comfort care was considered.  I introduced the concept of a comfort path to family, emphasizing that this path involves de-escalating and stopping full scope medical interventions, allowing a natural course to occur. Discussed that the goal is comfort and dignity rather than cure/prolonging life. Encouraged family to consider at what point they would want to stop full scope medical interventions, keeping in mind the concept of quality of life.   Holly Weber is interested in all aggressive medical interventions offered. We did discuss code status. Encouraged him to consider DNR/DNI status understanding evidenced based poor outcomes in similar hospitalized patients, as the cause of the arrest is likely associated with chronic/terminal disease rather than a reversible acute cardio-pulmonary event. Holly Weber wants his mother to remain full code. He states it was her wish to be resuscitated if needed. He states he intends to honor this "even if it breaks every bone in her body".   Discussed the importance of continued conversation with family and the medical team regarding overall plan of care and treatment options, ensuring decisions are within the context of the patients values and GOCs.   Primary decision maker: Very complicated family and social situation. There is no documented HCPOA. Next of kin is the majority of patient's available adult children. This appears to be sons Holly Weber and Holly Weber. It should be noted that Holly Weber did not speak a single word during our discussion and deferred everything to Holly Weber.     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 The following aspects are pertinent.  Past Medical History:  Diagnosis Date   Cancer ([place])    Colitis, ulcerative ([place])    Depression    Hepatitis C    Hypertension    Stroke [facility])    Thyroid disease     Family History  Problem Relation Age of Onset   Hypertension Mother    Hypertension Father  Scheduled Meds:  enoxaparin (LOVENOX) injection  30 mg Subcutaneous Q24H   insulin aspart  0-15 Units Subcutaneous Q4H   methadone  5 mg Oral TID   predniSONE  10 mg Oral Daily   Continuous Infusions:  azithromycin Stopped (10/13/21 1747)   ceFEPime (MAXIPIME) IV 2 g (10/14/21 1031)   metronidazole 500 mg (10/13/21 2159)   PRN Meds:.acetaminophen, albuterol, benzonatate, haloperidol lactate, ondansetron **OR** ondansetron (ZOFRAN) IV, oxyCODONE   Allergies  Allergen Reactions   Asa [Aspirin] Other (See Comments)    Ulcerative colitis flares.   Nsaids Other (See Comments)    Ulcerative colitis flares   Peanut-Containing Drug Products Other (See Comments)    Stomach upset   Penicillins Rash and Other (See Comments)    Has patient had a PCN reaction causing immediate rash, facial/tongue/throat swelling, SOB or lightheadedness with hypotension:unsure Has patient had a PCN reaction causing severe rash involving mucus membranes or skin  necrosis:unsure Has patient had a PCN reaction that required hospitalization:No Has patient had a PCN reaction occurring within the last 10 years:Yes If all of the above answers are "NO", then may proceed with Cephalosporin use.    Review of Systems  Unable to perform ROS: Patient unresponsive   Physical Exam Vitals reviewed.  Constitutional:      General: She is not in acute distress.    Appearance: She is ill-appearing.  Pulmonary:     Effort: Pulmonary effort is normal.  Neurological:     Motor: Weakness present.     Comments: Somnolent, minimally responsive    Palliative Assessment/Data: PPS 10%    Time Total: 120 minutes Greater than 50%  of this time was spent counseling and coordinating care related to the above assessment and plan. Signed by: Lavena Bullion, NP   Please contact Palliative Medicine Team phone at [phone] for questions and concerns.  For individual provider: See Shea Evans

## 2021-10-14 NOTE — Progress Notes (Signed)
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  Noted to be hypoxic with saturations in the 70s.  Nursing following multiple levels along with a clear plastic bag with white substance in it.  This was given to security.  Patient was placed on BiPAP.  Hospitalize for further management.  CT scan of the chest raised concern for pneumonia.  Started on IV antibiotics.  Remains somnolent.  Prognosis is guarded.  Consultants: Palliative care  Procedures: None    Subjective: Patient noted to be on Ventimask.  Somnolent but arousable.  Son is at bedside.  Overnight events noted.    Assessment/Plan:  * Acute on chronic respiratory failure with hypoxia (HCC) It appears that patient uses oxygen at home as well.  Came in with saturations in the 70s.  Found to have pneumonia on imaging studies.  Started on antibiotics.   Initially placed on BiPAP and then subsequently removed.  Overnight she became agitated and had to be placed on nonrebreather initially and then transitioned to Ventimask.  Somnolent this morning.  Saturations noted to be in the early 55s.  ABG from last night reviewed. CT angiogram did not show any PE but small pneumothorax was noted.  Moderate right pleural effusion was noted.   Will repeat chest x-ray today.  Pneumonia- (present on admission) In the setting of known malignancy.  WBC noted to be elevated.  Lactic acid was also mildly elevated.  Started on broad-spectrum antibiotics with vancomycin and cefepime and flagyl.  Azithromycin was added.  MRSA PCR negative.  Vancomycin has been  discontinued.  Follow-up on blood cultures.  Respiratory status remains tenuous.  Acute metabolic encephalopathy- (present on admission) Somnolence likely due to narcotics and benzodiazepine.  Was given Haldol earlier today.  Avoid sedative agents as much as possible.  Will discontinue Ativan.  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  Seen by Dr. Lorenso Courier with medical oncology in the past 2 years.  Currently under hospice care.  Noted to be on multiple different long-term pain medications including methadone as well as fentanyl patch.  Also noted to be in Oxy IR.  Has been quite somnolent.  Pain medications were adjusted.  Continuing just methadone for now.  Holding off on fentanyl patch.  Oxycodone dose is also being decreased.  Avoid excessive use of sedative medications.  We will stop her Ativan.  Accelerated hypertension Was quite hypertensive on initial assessment.  Used to be on propranolol previously but not on them currently.  Blood pressures have stabilized.  Continue to monitor.    Hyperglycemia- (present on admission) Possibly from steroids.  She is noted to be on prednisone 10 mg daily.  Continue SSI.  CBGs have improved.  HbA1c is pending.  Electrolyte abnormality- (present on admission) Hyponatremia Hypokalemia  Labs are all pending from this morning.    IV drug user- (present on admission) Longstanding history of same.  Urine drug screen is pending  Cocaine abuse ([facility])- (present on admission) Longstanding history of same  Hospice care patient - yet remains a FULL CODE Palliative care has been consulted to assist  with goals of care.  However I also had a discussion with patient's son this morning who was at bedside.  He mentions that family wants to respect patient's wishes which previously has been to provide full scope of care including resuscitation and ventilatory support as needed.     DVT  Prophylaxis: Lovenox Code Status: Full code Family Communication: Discussed with patient.  No family at bedside.  Will update sister Disposition Plan: Hopefully return home when improved  Status is: Observation  The patient will require care spanning > 2 midnights and should be moved to inpatient because: Acute respiratory failure with hypoxia        Medications: Scheduled:  enoxaparin (LOVENOX) injection  30 mg Subcutaneous Q24H   insulin aspart  0-15 Units Subcutaneous Q4H   methadone  5 mg Oral TID   predniSONE  10 mg Oral Daily   Continuous:  azithromycin Stopped (10/13/21 1747)   ceFEPime (MAXIPIME) IV 2 g (10/14/21 4403)   metronidazole 500 mg (10/13/21 2159)   KVQ:QVZDGLOVFIEPP, albuterol, benzonatate, haloperidol lactate, LORazepam, ondansetron **OR** ondansetron (ZOFRAN) IV, oxyCODONE  Antibiotics: Anti-infectives (From admission, onward)    Start     Dose/Rate Route Frequency Ordered Stop   10/13/21 1400  azithromycin (ZITHROMAX) 500 mg in sodium chloride 0.9 % 250 mL IVPB        500 mg 250 mL/hr over 60 Minutes Intravenous Every 24 hours 10/13/21 1337 17-Nov-2021 1359   10/13/21 1000  vancomycin (VANCOREADY) IVPB 750 mg/150 mL  Status:  Discontinued        750 mg 150 mL/hr over 60 Minutes Intravenous Every 12 hours 10/12/21 2043 10/13/21 1337   10/13/21 0600  ceFEPIme (MAXIPIME) 2 g in sodium chloride 0.9 % 100 mL IVPB        2 g 200 mL/hr over 30 Minutes Intravenous Every 8 hours 10/12/21 2239     10/12/21 2245  ceFEPIme (MAXIPIME) 2 g in sodium chloride 0.9 % 100 mL IVPB  Status:  Discontinued        2 g 200 mL/hr over 30 Minutes Intravenous Every 8 hours 10/12/21 2238 10/12/21 2239   10/12/21 2045  vancomycin (VANCOREADY) IVPB 1500 mg/300 mL        1,500 mg 150 mL/hr over 120 Minutes Intravenous  Once 10/12/21 2043 10/13/21 0203   10/12/21 2015  vancomycin (VANCOCIN) IVPB 1000 mg/200 mL premix  Status:  Discontinued        1,000 mg 200 mL/hr over 60 Minutes  Intravenous  Once 10/12/21 2003 10/12/21 2043   10/12/21 2000  metroNIDAZOLE (FLAGYL) IVPB 500 mg        500 mg 100 mL/hr over 60 Minutes Intravenous Every 12 hours 10/12/21 1945     10/12/21 1945  ceFEPIme (MAXIPIME) 2 g in sodium chloride 0.9 % 100 mL IVPB        2 g 200 mL/hr over 30 Minutes Intravenous  Once 10/12/21 1944 10/12/21 2126       Objective:  Vital Signs  Vitals:   10/14/21 0055 10/14/21 0402 10/14/21 0407 10/14/21 0455  BP:  125/81 125/81 125/81  Pulse:  (!) 114 (!) 114 (!) 114  Resp: 20 20 20 20   Temp: 98.2 F (36.8 C) 98.4 F (36.9 C) 98.4 F (36.9 C) 98.4 F (36.9 C)  TempSrc: Oral Oral    SpO2: (!) 89% 94%      Intake/Output Summary (Last 24 hours) at 10/14/2021 2951 Last data filed at 10/14/2021 0500 Gross per 24 hour  Intake 550.5 ml  Output --  Net 550.5 ml   There were no vitals filed for this visit.   General appearance: Somnolent Resp: Diminished air entry at the bases.  Few crackles.  No wheezing. Cardio: S1-S2 is tachycardic regular.  No S3-S4. GI: Abdomen is soft.  Nontender nondistended.  Bowel sounds are present normal.  No masses organomegaly Extremities: No edema.  Noted to be moving all of her extremities Neurologic: No focal neurological deficits.      Lab Results:  Data Reviewed: I have personally reviewed labs and imaging study reports  CBC: Recent Labs  Lab 10/12/21 1603 10/12/21 1825 10/13/21 0437  WBC 20.8*  --  19.7*  NEUTROABS 18.2*  --  17.5*  HGB 16.7* 17.7* 15.2*  HCT 53.1* 52.0* 49.7*  MCV 78.4*  --  79.1*  PLT 492*  --  418*    Basic Metabolic Panel: Recent Labs  Lab 10/12/21 1603 10/12/21 1825 10/13/21 0437  NA 133* 125* 133*  K 3.5 >8.5* 3.4*  CL 89*  --  96*  CO2 26  --  28  GLUCOSE 208*  --  221*  BUN <5*  --  8  CREATININE 0.72  --  0.56  CALCIUM 9.1  --  8.8*  MG  --   --  2.1    GFR: CrCl cannot be calculated (Unknown ideal weight.).  Liver Function Tests: Recent Labs  Lab  10/12/21 1603 10/13/21 0437  AST 20 12*  ALT 9 8  ALKPHOS 77 65  BILITOT 1.2 0.8  PROT 8.0 7.3  ALBUMIN 2.9* 2.5*     CBG: Recent Labs  Lab 10/13/21 1751 10/13/21 1954 10/14/21 0048 10/14/21 0400 10/14/21 0753  GLUCAP 174* 140* 111* 107* 111*     Recent Results (from the past 240 hour(s))  Resp Panel by RT-PCR (Flu A&B, Covid) Nasopharyngeal Swab     Status: None   Collection Time: 10/12/21  4:45 PM   Specimen: Nasopharyngeal Swab; Nasopharyngeal(NP) swabs in vial transport medium  Result Value Ref Range Status   SARS Coronavirus 2 by RT PCR NEGATIVE NEGATIVE Final    Comment: (NOTE) SARS-CoV-2 target nucleic acids are NOT DETECTED.  The SARS-CoV-2 RNA is generally detectable in upper respiratory specimens during the acute phase of infection. The lowest concentration of SARS-CoV-2 viral copies this assay can detect is 138 copies/mL. A negative result does not preclude SARS-Cov-2 infection and should not be used as the sole basis for treatment or other patient management decisions. A negative result may occur with  improper specimen collection/handling, submission of specimen other than nasopharyngeal swab, presence of viral mutation(s) within the areas targeted by this assay, and inadequate number of viral copies(<138 copies/mL). A negative result must be combined with clinical observations, patient history, and epidemiological information. The expected result is Negative.  Fact Sheet for Patients:  EntrepreneurPulse.com.au  Fact Sheet for Healthcare Providers:  IncredibleEmployment.be  This test is no t yet approved or cleared by the [place] FDA and  has been authorized for detection and/or diagnosis of SARS-CoV-2 by FDA under an Emergency Use Authorization (EUA). This EUA will remain  in effect (meaning this test can be used) for the duration of the COVID-19 declaration under Section 564(b)(1) of the Act,  21 U.S.C.section 360bbb-3(b)(1), unless the authorization is terminated  or revoked sooner.       Influenza A by PCR NEGATIVE NEGATIVE Final   Influenza B by PCR NEGATIVE NEGATIVE Final    Comment: (NOTE) The  Xpert Xpress SARS-CoV-2/FLU/RSV plus assay is intended as an aid in the diagnosis of influenza from Nasopharyngeal swab specimens and should not be used as a sole basis for treatment. Nasal washings and aspirates are unacceptable for Xpert Xpress SARS-CoV-2/FLU/RSV testing.  Fact Sheet for Patients: EntrepreneurPulse.com.au  Fact Sheet for Healthcare Providers: IncredibleEmployment.be  This test is not yet approved or cleared by the [place] FDA and has been authorized for detection and/or diagnosis of SARS-CoV-2 by FDA under an Emergency Use Authorization (EUA). This EUA will remain in effect (meaning this test can be used) for the duration of the COVID-19 declaration under Section 564(b)(1) of the Act, 21 U.S.C. section 360bbb-3(b)(1), unless the authorization is terminated or revoked.  Performed at [facility], [place] [address] [place], [place] [address]   Blood culture (routine x 2)     Status: None (Preliminary result)   Collection Time: 10/12/21  5:49 PM   Specimen: BLOOD LEFT HAND  Result Value Ref Range Status   Specimen Description BLOOD LEFT HAND  Final   Special Requests   Final    BOTTLES DRAWN AEROBIC AND ANAEROBIC Blood Culture adequate volume   Culture   Final    NO GROWTH 2 DAYS Performed at [facility], [place] [address] [place], [place] [address]    Report Status PENDING  Incomplete  Blood culture (routine x 2)     Status: None (Preliminary result)   Collection Time: 10/12/21  6:13 PM   Specimen: BLOOD LEFT FOREARM  Result Value Ref Range Status   Specimen Description BLOOD LEFT FOREARM  Final   Special Requests   Final    BOTTLES DRAWN AEROBIC AND ANAEROBIC Blood Culture adequate volume    Culture   Final    NO GROWTH 2 DAYS Performed at [facility], [place] [address], [place], [place] [address]    Report Status PENDING  Incomplete  MRSA Next Gen by PCR, Nasal     Status: None   Collection Time: 10/13/21  8:59 AM   Specimen: Nasal Mucosa; Nasal Swab  Result Value Ref Range Status   MRSA by PCR Next Gen NOT DETECTED NOT DETECTED Final    Comment: (NOTE) The GeneXpert MRSA Assay (FDA approved for NASAL specimens only), is one component of a comprehensive MRSA colonization surveillance program. It is not intended to diagnose MRSA infection nor to guide or monitor treatment for MRSA infections. Test performance is not FDA approved in patients less than 55 years old. Performed at [facility], [place] [address] [place],  [address]       Radiology Studies: CT Angio Chest PE W and/or Wo Contrast  Result Date: 10/12/2021 CLINICAL DATA:  Pulmonary embolism (PE) suspected, high prob. Shortness of breath EXAM: CT ANGIOGRAPHY CHEST WITH CONTRAST TECHNIQUE: Multidetector CT imaging of the chest was performed using the standard protocol during bolus administration of intravenous contrast. Multiplanar CT image reconstructions and MIPs were obtained to evaluate the vascular anatomy. RADIATION DOSE REDUCTION: This exam was performed according to the departmental dose-optimization program which includes automated exposure control, adjustment of the mA and/or kV according to patient size and/or use of iterative reconstruction technique. CONTRAST:  37m OMNIPAQUE IOHEXOL 350 MG/ML SOLN COMPARISON:  09/13/2020 FINDINGS: Cardiovascular: No filling defects in the pulmonary arteries to suggest pulmonary emboli. Heart is normal size. Aorta is normal caliber. Mediastinum/Nodes: Significantly worsening mediastinal adenopathy. Conglomerate subcarinal nodal mass has a short axis diameter of 4.7 cm. Mild bilateral hilar  adenopathy is similar to prior study. Lungs/Pleura: Worsening masses in  the right upper lobe. Central right upper lobe mass currently measures 5.3 x 4.7 cm compared to 2.0 x 1.8 cm previously. Peripheral right upper lobe mass lesion measures 3.7 x 3.6 cm compared to 2.8 x 2.1 cm previously. Moderate right pleural effusion. Small right side pneumothorax noted, best seen overlying the peripheral right upper lobe mass. Moderate centrilobular emphysema. Airspace disease seen bilaterally in the lower lobes, left greater than right as well as in the right upper lobe. Findings concerning for pneumonia. Upper Abdomen: Imaging into the upper abdomen demonstrates no acute findings. Musculoskeletal: Chest wall soft tissues are unremarkable. No acute bony abnormality. Review of the MIP images confirms the above findings. IMPRESSION: No evidence of pulmonary embolus. Worsening masses in the right upper lobe concerning for worsening lung cancer. Markedly worsening mediastinal adenopathy. Moderate right pleural effusion. Small right pneumothorax of unknown etiology. Patchy bilateral airspace opacities, left greater than right concerning for pneumonia. Aortic Atherosclerosis (ICD10-I70.0) and Emphysema (ICD10-J43.9). Electronically Signed   By: Rolm Baptise M.D.   On: 10/12/2021 21:01   DG Chest Port 1 View  Result Date: 10/12/2021 CLINICAL DATA:  Shortness of breath EXAM: PORTABLE CHEST 1 VIEW COMPARISON:  07/13/2020 FINDINGS: Right Port-A-Cath remains in place, unchanged. Heart is normal size. Patchy bilateral airspace disease, worsening since prior study. Rounded masslike opacity in the right mid lung again noted suspect small right effusion. Probable mediastinal and hilar adenopathy with bilateral hilar fullness. IMPRESSION: Right mid lung mass with bilateral hilar adenopathy and mediastinal adenopathy suspected. Patchy bilateral airspace disease concerning for pneumonia. Small right effusion. Electronically Signed   By: Rolm Baptise M.D.   On: 10/12/2021 17:19       LOS: 1 day   [place] [facility] Pager on [email or web address]  10/14/2021, 9:39 AM

## 2021-10-14 NOTE — Assessment & Plan Note (Addendum)
Somnolence likely due to narcotics and benzodiazepine.  Avoid sedative agents as much as possible.  Benzodiazepines were discontinued.

## 2021-10-14 NOTE — Progress Notes (Signed)
[place] [facility]) hospitalized hospice patient visit 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 According to her son the patient began experiencing symptoms of worsening lethargy and shortness of breath on Friday 1.20 and then again on Saturday 1.21 when EMS was called and patient was taken and admitted to the hospital for further management. She was admitted to [facility] on 1.21 with a diagnosis of acute on chronic respiratory failure and Pneumonia. Per Dr. Tomasa Hosteller with [facility] this is a related hospice admission. [facility] was notified by a neighbor of hospital admission on 1.23 Exchanged report with patient's bedside nurse Rey who reports patient has remained relatively lethargic and has required both bipap, non-rebreather and venti- mask at different times due to low oxygen saturations. They are working on decreasing some of the sedating medications she is on. She is currently being treated with IVAB and is on venti mask at 14L during visit. She will wake when her name is called loudly but then goes right back to sleep. Patient's son Holly Weber is a bedside and reports he was planning on moving in with his mother due to her increasing care needs and this will likely be the plan when she returns home. He reports he was not present when EMS was called.  Patient is inpatient appropriate due to need for IV antibiotics and current high oxygen demands.  Vital Signs-  98.2/113/20   125/81   spO2- 92% on 14L Intake/Output- 550/0 recorded Abnormal labs-  pCO2 49.4, pO2 61, AcidBase 7, Bicarb 31.5, Chloride 96, Glucose 129, Albumin 2.4, WBC 19.4,  Diagnostics-   PORTABLE CHEST 1 VIEW  COMPARISON:  Previous studies including the examination of 10/12/2021  FINDINGS: Transverse diameter of heart is within normal limits. Thoracic aorta is ectatic. Right hilum is prominent. There is homogeneous opacity in the  right parahilar region. These findings have not changed significantly. Increased interstitial markings are seen in the left mid and left lower lung fields with interval worsening of pneumonia. There is blunting of lateral CP angles. There is no pneumothorax. Tip of chest port is seen in the superior vena cava close to the right atrium.  IMPRESSION: There is abnormal prominence of right hilum and homogeneous opacity in the right parahilar region suggesting possible pneumonia or neoplastic process. There is interval worsening of interstitial infiltrate in the left mid and left lower lung fields. Possible small bilateral pleural effusions.  Electronically Signed   By: Elmer Picker M.D.   On: 10/14/2021 12:09 IV/PRN Meds- Maxipime 2g q8hours, Vibramycin 1656m q12hours, Flagyl 5072mq12hours, albuterol 2.5neb x1, Haldol 56m18mV x1, Ativan 0.56mg57m, Oxycodone 10mg69mours  Problem List Acute on chronic respiratory failure with hypoxia (HCC) It appears that patient uses oxygen at home as well.  Came in with saturations in the 70s.  Found to have pneumonia on imaging studies.  Started on antibiotics.   Initially placed on BiPAP and then subsequently removed.  Overnight she became agitated and had to be placed on nonrebreather initially and then transitioned to Ventimask.  Somnolent this morning.  Saturations noted to be in the early 90s. 40sG from last night reviewed. CT angiogram did not show any PE but small pneumothorax was noted.  Moderate right pleural effusion was noted.   Will repeat chest x-ray today. Pneumonia- (present on admission) In the setting of known malignancy.  WBC noted to be elevated.  Lactic acid was also mildly  elevated.  Started on broad-spectrum antibiotics with vancomycin and cefepime and flagyl.  Azithromycin was added.  MRSA PCR negative.  Vancomycin has been discontinued.  Follow-up on blood cultures.  Respiratory status remains tenuous.  Acute metabolic  encephalopathy- (present on admission) Somnolence likely due to narcotics and benzodiazepine.  Was given Haldol earlier today.  Avoid sedative agents as much as possible.  Will discontinue Ativan.  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  Seen by Dr. Lorenso Courier with medical oncology in the past 2 years.  Currently under hospice care.  Noted to be on multiple different long-term pain medications including methadone as well as fentanyl patch.  Also noted to be in Oxy IR.  Has been quite somnolent.  Pain medications were adjusted.  Continuing just methadone for now.  Holding off on fentanyl patch.  Oxycodone dose is also being decreased.  Avoid excessive use of sedative medications.  We will stop her Ativan.   Discharge Planning- Ongoing Family Contact- son Holly Weber at bedside IDT- Updated Goals of care - Ongoing, son reports patient's wish would be to recover enough to return home Should patient need ambulance transfer at discharge please use [facility]) as they contract this service for our active hospice patients.  Hospital transfer and medication list placed on patient's hard chart.  Jhonnie Garner, Therapist, sports, BSN, [facility] Liaison [phone]

## 2021-10-14 NOTE — Progress Notes (Addendum)
Pt taken off NRB and placed on 10L venti-mask.  Pt's 02 goal is 92% pr above. SP02 is 93% on 10L. will continue to monitor

## 2021-10-14 NOTE — Progress Notes (Signed)
At 0030 this patient was jumping out of bed because she urinated on herself. We worked to calm her down and clean her up but she became uncooperative and was trying to force through [place] to get out of bed. I gave her haldol and she calmed down but her O2 sats dropped and maintained at 83-87. Respiratory advised to put her on the venti mask. We put her on and maxed her out at 14L but she was still between 89-91 O2 sat. We switched her to a nonrebreather and her sats came up to 92-93. She is currently sleeping and on the nonrebreather. Respiratory therapist said she will come check on her when she comes to the floor between 0200-0300. Will continue to monitor pt.

## 2021-10-14 NOTE — Progress Notes (Signed)
Pt has PRN BIPAP orders, no distress noted at this time.

## 2021-10-15 ENCOUNTER — Inpatient Hospital Stay (HOSPITAL_COMMUNITY): Payer: Medicaid Other

## 2021-10-15 DIAGNOSIS — Z7952 Long term (current) use of systemic steroids: Secondary | ICD-10-CM

## 2021-10-15 DIAGNOSIS — J9621 Acute and chronic respiratory failure with hypoxia: Secondary | ICD-10-CM | POA: Diagnosis not present

## 2021-10-15 DIAGNOSIS — R0602 Shortness of breath: Secondary | ICD-10-CM

## 2021-10-15 DIAGNOSIS — G9341 Metabolic encephalopathy: Secondary | ICD-10-CM | POA: Diagnosis not present

## 2021-10-15 DIAGNOSIS — F1410 Cocaine abuse, uncomplicated: Secondary | ICD-10-CM | POA: Diagnosis not present

## 2021-10-15 DIAGNOSIS — J189 Pneumonia, unspecified organism: Secondary | ICD-10-CM | POA: Diagnosis not present

## 2021-10-15 DIAGNOSIS — E119 Type 2 diabetes mellitus without complications: Secondary | ICD-10-CM

## 2021-10-15 DIAGNOSIS — Z66 Do not resuscitate: Secondary | ICD-10-CM

## 2021-10-15 DIAGNOSIS — C7A8 Other malignant neuroendocrine tumors: Secondary | ICD-10-CM | POA: Diagnosis not present

## 2021-10-15 LAB — POCT I-STAT EG7
Acid-Base Excess: 8 mmol/L — ABNORMAL HIGH (ref 0.0–2.0)
Bicarbonate: 32.5 mmol/L — ABNORMAL HIGH (ref 20.0–28.0)
Calcium, Ion: 0.88 mmol/L — CL (ref 1.15–1.40)
HCT: 52 % — ABNORMAL HIGH (ref 36.0–46.0)
Hemoglobin: 17.7 g/dL — ABNORMAL HIGH (ref 12.0–15.0)
O2 Saturation: 99 %
Potassium: >8.5 mmol/L (ref 3.5–5.1)
Sodium: 125 mmol/L — ABNORMAL LOW (ref 135–145)
TCO2: 34 mmol/L — ABNORMAL HIGH (ref 22–32)
pCO2, Ven: 44.3 mmHg (ref 44.0–60.0)
pH, Ven: 7.474 — ABNORMAL HIGH (ref 7.250–7.430)
pO2, Ven: 122 mmHg — ABNORMAL HIGH (ref 32.0–45.0)

## 2021-10-15 LAB — HEMOGLOBIN A1C
Hgb A1c MFr Bld: 10.8 % — ABNORMAL HIGH (ref 4.8–5.6)
Mean Plasma Glucose: 263 mg/dL

## 2021-10-15 LAB — CBC
HCT: 45.2 % (ref 36.0–46.0)
Hemoglobin: 14.2 g/dL (ref 12.0–15.0)
MCH: 24.9 pg — ABNORMAL LOW (ref 26.0–34.0)
MCHC: 31.4 g/dL (ref 30.0–36.0)
MCV: 79.2 fL — ABNORMAL LOW (ref 80.0–100.0)
Platelets: 367 10*3/uL (ref 150–400)
RBC: 5.71 MIL/uL — ABNORMAL HIGH (ref 3.87–5.11)
RDW: 14.1 % (ref 11.5–15.5)
WBC: 14.2 10*3/uL — ABNORMAL HIGH (ref 4.0–10.5)
nRBC: 0 % (ref 0.0–0.2)

## 2021-10-15 LAB — BASIC METABOLIC PANEL
Anion gap: 15 (ref 5–15)
BUN: 8 mg/dL (ref 6–20)
CO2: 26 mmol/L (ref 22–32)
Calcium: 8.6 mg/dL — ABNORMAL LOW (ref 8.9–10.3)
Chloride: 94 mmol/L — ABNORMAL LOW (ref 98–111)
Creatinine, Ser: 0.65 mg/dL (ref 0.44–1.00)
GFR, Estimated: >60 mL/min (ref >60)
Glucose, Bld: 118 mg/dL — ABNORMAL HIGH (ref 70–99)
Potassium: 3.2 mmol/L — ABNORMAL LOW (ref 3.5–5.1)
Sodium: 135 mmol/L (ref 135–145)

## 2021-10-15 LAB — GLUCOSE, CAPILLARY
Glucose-Capillary: 110 mg/dL — ABNORMAL HIGH (ref 70–99)
Glucose-Capillary: 113 mg/dL — ABNORMAL HIGH (ref 70–99)
Glucose-Capillary: 119 mg/dL — ABNORMAL HIGH (ref 70–99)
Glucose-Capillary: 130 mg/dL — ABNORMAL HIGH (ref 70–99)

## 2021-10-15 LAB — BLOOD GAS, ARTERIAL
Acid-Base Excess: 3.8 mmol/L — ABNORMAL HIGH (ref 0.0–2.0)
Bicarbonate: 28.3 mmol/L — ABNORMAL HIGH (ref 20.0–28.0)
FIO2: 76
O2 Saturation: 90.6 %
Patient temperature: 36.7
pCO2 arterial: 45.6 mmHg (ref 32.0–48.0)
pH, Arterial: 7.408 (ref 7.350–7.450)
pO2, Arterial: 59.1 mmHg — ABNORMAL LOW (ref 83.0–108.0)

## 2021-10-15 MED ORDER — LORAZEPAM 2 MG/ML PO CONC: 1.0000 mg | ORAL | Status: DC | PRN

## 2021-10-15 MED ORDER — ACETAMINOPHEN 325 MG PO TABS: 650.0000 mg | ORAL_TABLET | Freq: Four times a day (QID) | ORAL | Status: DC | PRN

## 2021-10-15 MED ORDER — GLYCOPYRROLATE 1 MG PO TABS: 1.0000 mg | ORAL_TABLET | ORAL | Status: DC | PRN

## 2021-10-15 MED ORDER — HALOPERIDOL LACTATE 5 MG/ML IJ SOLN: 2.0000 mg | Freq: Four times a day (QID) | INTRAMUSCULAR | Status: DC | PRN

## 2021-10-15 MED ORDER — HALOPERIDOL 2 MG PO TABS: 2.0000 mg | ORAL_TABLET | Freq: Four times a day (QID) | ORAL | 0 refills | Status: AC | PRN

## 2021-10-15 MED ORDER — POLYVINYL ALCOHOL 1.4 % OP SOLN
1.0000 [drp] | Freq: Four times a day (QID) | OPHTHALMIC | Status: DC | PRN
  Filled 2021-10-15: qty 15

## 2021-10-15 MED ORDER — ONDANSETRON 4 MG PO TBDP: 4.0000 mg | ORAL_TABLET | Freq: Four times a day (QID) | ORAL | Status: DC | PRN

## 2021-10-15 MED ORDER — LORAZEPAM 2 MG/ML IJ SOLN
0.5000 mg | Freq: Once | INTRAMUSCULAR | Status: AC
  Administered 2021-10-15: 15:00:00 0.5 mg via INTRAVENOUS
  Filled 2021-10-15: qty 1

## 2021-10-15 MED ORDER — HALOPERIDOL LACTATE 2 MG/ML PO CONC
2.0000 mg | Freq: Four times a day (QID) | ORAL | Status: DC | PRN
  Filled 2021-10-15: qty 1

## 2021-10-15 MED ORDER — HALOPERIDOL 1 MG PO TABS
2.0000 mg | ORAL_TABLET | Freq: Four times a day (QID) | ORAL | Status: DC | PRN
  Filled 2021-10-15: qty 2

## 2021-10-15 MED ORDER — GLYCOPYRROLATE 0.2 MG/ML IJ SOLN: 0.2000 mg | INTRAMUSCULAR | Status: DC | PRN

## 2021-10-15 MED ORDER — ACETAMINOPHEN 650 MG RE SUPP: 650.0000 mg | RECTAL | 0 refills | Status: AC | PRN

## 2021-10-15 MED ORDER — POTASSIUM CHLORIDE 10 MEQ/100ML IV SOLN
10.0000 meq | INTRAVENOUS | Status: AC
  Administered 2021-10-15 (×2): 10 meq via INTRAVENOUS
  Filled 2021-10-15: qty 100

## 2021-10-15 MED ORDER — LORAZEPAM 1 MG PO TABS: 1.0000 mg | ORAL_TABLET | ORAL | Status: DC | PRN

## 2021-10-15 MED ORDER — FENTANYL 50 MCG/HR TD PT72
1.0000 | MEDICATED_PATCH | TRANSDERMAL | Status: DC
  Administered 2021-10-15: 16:00:00 1 via TRANSDERMAL
  Filled 2021-10-15: qty 1

## 2021-10-15 MED ORDER — BIOTENE DRY MOUTH MT LIQD
15.0000 mL | Freq: Two times a day (BID) | OROMUCOSAL | Status: DC
  Administered 2021-10-15: 16:00:00 15 mL via TOPICAL

## 2021-10-15 MED ORDER — POLYVINYL ALCOHOL 1.4 % OP SOLN: 1.0000 [drp] | Freq: Four times a day (QID) | OPHTHALMIC | 0 refills | Status: AC | PRN

## 2021-10-15 MED ORDER — MORPHINE SULFATE (PF) 2 MG/ML IV SOLN
4.0000 mg | INTRAVENOUS | Status: DC | PRN
  Administered 2021-10-15 (×2): 4 mg via INTRAVENOUS
  Filled 2021-10-15 (×2): qty 2

## 2021-10-15 MED ORDER — LORAZEPAM 2 MG/ML IJ SOLN
1.0000 mg | INTRAMUSCULAR | Status: DC | PRN
  Administered 2021-10-15: 18:00:00 1 mg via INTRAVENOUS
  Filled 2021-10-15: qty 1

## 2021-10-15 MED ORDER — ONDANSETRON HCL 4 MG PO TABS: 4.0000 mg | ORAL_TABLET | Freq: Four times a day (QID) | ORAL | 0 refills | Status: AC | PRN

## 2021-10-15 MED ORDER — ACETAMINOPHEN 650 MG RE SUPP: 650.0000 mg | Freq: Four times a day (QID) | RECTAL | Status: DC | PRN

## 2021-10-15 MED ORDER — ONDANSETRON HCL 4 MG/2ML IJ SOLN: 4.0000 mg | Freq: Four times a day (QID) | INTRAMUSCULAR | Status: DC | PRN

## 2021-10-15 MED ORDER — MORPHINE SULFATE (PF) 2 MG/ML IV SOLN: 4.0000 mg | INTRAVENOUS | 0 refills | Status: AC | PRN

## 2021-10-15 MED ORDER — GLYCOPYRROLATE 1 MG PO TABS: 1.0000 mg | ORAL_TABLET | ORAL | 0 refills | Status: AC | PRN

## 2021-10-15 NOTE — TOC Progression Note (Signed)
Transition of Care [facility]) - Progression Note    Patient Details  Name: Holly Weber MRN: [id number] Date of Birth: 11/13/1965  Transition of Care [facility]) CM/SW [place], RN Phone Number:[phone]  10/15/2021, 3:55 PM  Clinical Narrative:    [facility] acknowledges consult for residential hospice. Patient  is active with Manufacturing [facility], [facility] liason has already been notified when consult entered. Patient has been approved for [facility] and in communication with Daphene Calamity for transition to [facility]. TOC will follow.         Expected Discharge Plan and Services                                                 Social Determinants of Health (SDOH) Interventions    Readmission Risk Interventions No flowsheet data found.

## 2021-10-15 NOTE — Progress Notes (Signed)
Report called to [facility]. Pt belongings (3 bags of clothing) send home with Orpah Greek. [facility] here to transfer patient. Right forearm IV left in place per Hopsice nurse request.    One navy blue lanyard and silver colored necklace given to Leonides Grills (son) upon discharge. He was still at bedside.    Leonides Grills stated he called Jac Canavan (son) to inform him of the patients transfer.

## 2021-10-15 NOTE — Progress Notes (Signed)
ABG drawn and sent to the lab.

## 2021-10-15 NOTE — Progress Notes (Addendum)
[place] 6E[facility]) hospitalized hospice patient visit  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 According to her son the patient began experiencing symptoms of worsening lethargy and shortness of breath on Friday 1.20 and then again on Saturday 1.21 when EMS was called and patient was taken and admitted to the hospital for further management. She was admitted to [facility] on 1.21 with a diagnosis of acute on chronic respiratory failure and Pneumonia. Per Dr. Tomasa Hosteller with [facility] this is a related hospice admission. [facility] was notified by a neighbor of hospital admission on 1.23  Visited patient at bedside. She was calm and eyes closed. I did not disturb her per report of her being agitated earlier. Received report from RN. Spoke with family who had just had a meeting with Palliative regarding [place]. Reviewed [place] with family and answered questions.   Patient is inpatient appropriate due to need for IV antibiotics and current high oxygen demands.   VS: 98, 157/96, 120, 16, 93% Venturi mask 14L   Abnormal labs:  10/15/21 02:58 pO2, Arterial: 59.1 (L) Acid-Base Excess: 3.8 (H) Bicarbonate: 28.3 (H) 10/15/21 03:22 Potassium: 3.2 (L) Chloride: 94 (L) Glucose: 118 (H) Calcium: 8.6 (L) 10/15/21 03:22 WBC: 14.2 (H) RBC: 5.71 (H) MCV: 79.2 (L) MCH: 24.9 (L)   Diagnostics  Cxr IMPRESSION: No new abnormality. No interval change in the bilateral lung opacities and pleural effusions, right upper lobe perihilar mass. Overall aeration seems unchanged.   Discharge Planning- Transfer to [facility]- Multiple family members at bedside IDT- Updated Goals of care - DNR. Comfort care.  Should patient need ambulance transfer at discharge please use [facility]) as they contract this service for our active hospice patients.   Please do not hesitate to  call with questions.   Thank you,   Farrel Gordon, RN, [facility] Liaison   [phone]

## 2021-10-15 NOTE — Progress Notes (Addendum)
Overnight progress note  Notified by RN that patient complaining of shortness of breath.  On 14 L Venturi mask and desatting to the mid to high 80s.  No increased work of breathing.  Tachycardic with heart rate in the 120s.  Blood pressure 146/99.  She was given albuterol nebulizer treatment.  Patient seen and examined at bedside.  Very somnolent but arousable.  She is not able to give any history.  Rhonchi appreciated upon auscultation of the lungs.  Not tachypneic.  Satting above 92% on 15 L Venturi mask.  Tachycardic with heart rate in the 110s.  Blood pressure stable.  CTA chest done 1/21 showing no PE.  Worsening masses in the right upper lobe concerning for worsening lung cancer.  Moderate right pleural effusion.  Small right pneumothorax.  Patchy bilateral airspace opacities, left greater than right concerning for pneumonia.  Chest x-ray done yesterday showing worsening of interstitial infiltrate in the left mid and lower lung fields.  Patient is on antibiotics for pneumonia.  -Repeat chest x-ray done tonight showing no new abnormality. -ABG showing worsening hypoxia (pH 7.40, PCO2 45, PO2 59) despite patient being on Venturi mask. -Palliative care team involved for goals of care discussion and patient remains full code.  I spoke to the patient's son [name] at bedside who informed me that patient told family she wants resuscitative efforts.  He wants the patient to remain full code until his brother is here in the morning to have further discussions with the palliative care team. -PCCM (Dr. Elwyn Reach) consulted, recommended BiPAP and ongoing goals of care discussion.   Addendum/update: Notified by RN that patient was not able to tolerate BiPAP.  Placed back on Venturi mask and satting in the low 90s.  PCCM notified, will see the patient.  Appreciate help.

## 2021-10-15 NOTE — Progress Notes (Addendum)
Manufacturing engineer [facility]) Hospital Liaison Note  Received request from family for family interest in [facility]. Services explained to family at length.  Approval for [employer] is determined by [facility] MD. Once [facility] MD has determined [facility] eligibility, [place] will update hospital staff and family. Patient has been approved for [employer].  Addendum: 3:59p--Consent forms to be completed & [facility] to be notified of patient D/C and transport arranged. TOC/Ella and Attending Physician/Dr. Ander Slade notified of DC plan.   Please send signed DNR form with patient and RN call report to [phone].   [facility] has been notified for transport vs. [facility] as she is an active hospice patient. [facility] dispatcher, [phone], notified [facility] of cancellation as transport previously arranged with them.   Please do not hesitate to call with any hospice related questions.    Thank you for the opportunity to participate in this patient's care.  Daphene Calamity, MSW [facility] Liaison  [phone]

## 2021-10-15 NOTE — Progress Notes (Signed)
Pt satting 88-91% on 14L Venturi. Breathing mildly labored. Critical care NP bedside.

## 2021-10-15 NOTE — Progress Notes (Signed)
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  Noted to be hypoxic with saturations in the 70s.  Nursing following multiple levels along with a clear plastic bag with white substance in it.  This was given to security.  Patient was placed on BiPAP.  Hospitalize for further management.  CT scan of the chest raised concern for pneumonia.  Started on IV antibiotics.  Remains somnolent.  Prognosis is guarded.  Overnight events noted.  Did not tolerate BiPAP.  Critical care medicine has been consulted.  Consultants: Palliative care.  Critical care medicine.  Procedures: None    Subjective: Noted to be on Ventimask.  Somnolent but arousable.  Son is at bedside.  Overnight events noted.     Assessment/Plan:  * Acute on chronic respiratory failure with hypoxia (HCC) It appears that patient uses oxygen at home as well.  Came in with saturations in the 70s.  Found to have pneumonia on imaging studies.  Started on antibiotics.   Initially placed on BiPAP and then subsequently removed.  Currently on Ventimask. Respiratory status remains tenuous.  She was given a dose of Lasix yesterday.  CT angiogram did not show any PE but small pneumothorax and moderate right pleural effusion was noted. Did not tolerate BiPAP overnight.  ABG reviewed. Critical care medicine has been consulted.  Palliative care to meet with family today.  Patient's previously known wishes were to have full scope of care.    Pneumonia- (present on admission) In the setting of known malignancy.  WBC noted to be elevated.  Lactic acid was  also mildly elevated.  Started on broad-spectrum antibiotics with vancomycin and cefepime and flagyl.  Azithromycin was added.  MRSA PCR negative.  Vancomycin has been discontinued.  Follow-up on blood cultures.  Respiratory status remains tenuous. WBC is better.  Acute metabolic encephalopathy- (present on admission) Somnolence likely due to narcotics and benzodiazepine.  Avoid sedative agents as much as possible.  Benzodiazepines were discontinued.  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  Seen by Dr. Lorenso Courier with medical oncology within the past 2 years.  Currently under hospice care but was full code.   Noted to be on multiple different long-term pain medications including methadone as well as fentanyl patch.  Also noted to be in Oxy IR.  Due to excessive somnolence pain medication doses were adjusted.  Holding off on fentanyl patch.  Ativan was discontinued.  Accelerated hypertension Occasional high readings noted.  Noted to have sinus tachycardia. Potassium to be repleted.  Hyperglycemia- (present on admission) Possibly from steroids.  She is noted to be on prednisone 10 mg daily.  Continue SSI.  CBGs have improved.  HbA1c is 10.8.  Seems to have diabetes mellitus type 2.  Home medication list was reviewed again.  Not noted to be on any glucose lowering agents at home.  Current chronic use of systemic steroids Noted to be on prednisone daily.  Continuing for now.  Electrolyte abnormality- (present on admission) Hyponatremia Hypokalemia  Sodium level has improved.  Potassium remains low and will be repleted.  Magnesium was 1.7 yesterday.  IV drug user- (present on admission) Longstanding history of same.  Urine drug screen is pending  Cocaine abuse ([place])- (present on admission) Longstanding history of same  Hospice care patient - yet remains a FULL CODE Discussions held with patient's sons as well as sisters.   Palliative care is also following.  Plan is for family meeting this afternoon at 1:30.       DVT Prophylaxis: Lovenox Code Status: Full code Family Communication: Discussed with son at bedside. Disposition Plan: Hopefully return home when improved  Status is: Inpatient  Remains inpatient appropriate because: Acute respiratory failure with hypoxia       Medications: Scheduled:  enoxaparin (LOVENOX) injection  30 mg Subcutaneous Q24H   insulin aspart  0-15 Units Subcutaneous Q4H   methadone  5 mg Oral TID   predniSONE  10 mg Oral Daily   Continuous:  ceFEPime (MAXIPIME) IV 2 g (10/15/21 0735)   doxycycline (VIBRAMYCIN) IV 100 mg (10/15/21 0531)   metronidazole 500 mg (10/14/21 2212)   AXE:[id number], albuterol, benzonatate, haloperidol lactate, ondansetron **OR** ondansetron (ZOFRAN) IV, oxyCODONE  Antibiotics: Anti-infectives (From admission, onward)    Start     Dose/Rate Route Frequency Ordered Stop   10/14/21 1600  doxycycline (VIBRAMYCIN) 100 mg in sodium chloride 0.9 % 250 mL IVPB        100 mg 125 mL/hr over 120 Minutes Intravenous Every 12 hours 10/14/21 1251 10-29-2021 1559   10/13/21 1400  azithromycin (ZITHROMAX) 500 mg in sodium chloride 0.9 % 250 mL IVPB  Status:  Discontinued        500 mg 250 mL/hr over 60 Minutes Intravenous Every 24 hours 10/13/21 1337 10/14/21 1251   10/13/21 1000  vancomycin (VANCOREADY) IVPB 750 mg/150 mL  Status:  Discontinued        750 mg 150 mL/hr over 60 Minutes Intravenous Every 12 hours 10/12/21 2043 10/13/21 1337   10/13/21 0600  ceFEPIme (MAXIPIME) 2 g in sodium chloride 0.9 % 100 mL IVPB        2 g 200 mL/hr over 30 Minutes Intravenous Every 8 hours 10/12/21 2239     10/12/21 2245  ceFEPIme (MAXIPIME) 2 g in sodium chloride 0.9 % 100 mL IVPB  Status:  Discontinued        2 g 200 mL/hr over 30 Minutes Intravenous Every 8 hours 10/12/21 2238 10/12/21 2239   10/12/21 2045  vancomycin (VANCOREADY) IVPB 1500 mg/300 mL         1,500 mg 150 mL/hr over 120 Minutes Intravenous  Once 10/12/21 2043 10/13/21 0203   10/12/21 2015  vancomycin (VANCOCIN) IVPB 1000 mg/200 mL premix  Status:  Discontinued        1,000 mg 200 mL/hr over 60 Minutes Intravenous  Once 10/12/21 2003 10/12/21 2043   10/12/21 2000  metroNIDAZOLE (FLAGYL) IVPB 500 mg        500 mg 100 mL/hr over 60 Minutes Intravenous Every 12 hours 10/12/21 1945     10/12/21 1945  ceFEPIme (MAXIPIME) 2 g in sodium chloride 0.9 % 100 mL IVPB        2 g 200 mL/hr over 30 Minutes Intravenous  Once 10/12/21 1944 10/12/21 2126       Objective:  Vital Signs  Vitals:   10/14/21 2033 10/15/21 0141 10/15/21 0419 10/15/21 0832  BP: 134/90   (!) 175/94  Pulse: (!) 115  (!) 118 (!) 122  Resp: 18  18   Temp: 98 F (36.7 C)  TempSrc: Axillary     SpO2:  93% 97% (!) 89%   No intake or output data in the 24 hours ending 10/15/21 0930  There were no vitals filed for this visit.   General appearance: Somnolent but arousable. Resp: Tachypneic.  No use of accessory muscles.  Diminished air entry at the bases.  No wheezing or rhonchi.  Crackles appreciated. Cardio: S1-S2 is tachycardic regular.  No S3-S4.  No rubs murmurs or bruit GI: Abdomen is soft.  Nontender nondistended.  Bowel sounds are present normal.  No masses organomegaly Extremities: No edema.   Neurologic: No focal neurological deficits.     Lab Results:  Data Reviewed: I have personally reviewed labs and imaging study reports  CBC: Recent Labs  Lab 10/12/21 1603 10/12/21 1825 10/13/21 0437 10/14/21 1026 10/15/21 0322  WBC 20.8*  --  19.7* 19.4* 14.2*  NEUTROABS 18.2*  --  17.5*  --   --   HGB 16.7* 17.7* 15.2* 13.7 14.2  HCT 53.1* 52.0* 49.7* 45.3 45.2  MCV 78.4*  --  79.1* 79.9* 79.2*  PLT 492*  --  418* 411* 367     Basic Metabolic Panel: Recent Labs  Lab 10/12/21 1603 10/12/21 1825 10/13/21 0437 10/14/21 1026 10/15/21 0322  NA 133* 125* 133* 135 135  K 3.5 >8.5* 3.4*  3.8 3.2*  CL 89*  --  96* 96* 94*  CO2 26  --  28 23 26   GLUCOSE 208*  --  221* 129* 118*  BUN <5*  --  8 10 8   CREATININE 0.72  --  0.56 0.69 0.65  CALCIUM 9.1  --  8.8* 9.0 8.6*  MG  --   --  2.1 1.7  --      GFR: CrCl cannot be calculated (Unknown ideal weight.).  Liver Function Tests: Recent Labs  Lab 10/12/21 1603 10/13/21 0437 10/14/21 1026  AST 20 12* 19  ALT 9 8 10   ALKPHOS 77 65 52  BILITOT 1.2 0.8 0.8  PROT 8.0 7.3 6.9  ALBUMIN 2.9* 2.5* 2.4*      CBG: Recent Labs  Lab 10/14/21 1659 10/14/21 2122 10/15/21 0028 10/15/21 0541 10/15/21 0829  GLUCAP 178* 111* 110* 119* 130*      Recent Results (from the past 240 hour(s))  Resp Panel by RT-PCR (Flu A&B, Covid) Nasopharyngeal Swab     Status: None   Collection Time: 10/12/21  4:45 PM   Specimen: Nasopharyngeal Swab; Nasopharyngeal(NP) swabs in vial transport medium  Result Value Ref Range Status   SARS Coronavirus 2 by RT PCR NEGATIVE NEGATIVE Final    Comment: (NOTE) SARS-CoV-2 target nucleic acids are NOT DETECTED.  The SARS-CoV-2 RNA is generally detectable in upper respiratory specimens during the acute phase of infection. The lowest concentration of SARS-CoV-2 viral copies this assay can detect is 138 copies/mL. A negative result does not preclude SARS-Cov-2 infection and should not be used as the sole basis for treatment or other patient management decisions. A negative result may occur with  improper specimen collection/handling, submission of specimen other than nasopharyngeal swab, presence of viral mutation(s) within the areas targeted by this assay, and inadequate number of viral copies(<138 copies/mL). A negative result must be combined with clinical observations, patient history, and epidemiological information. The expected result is Negative.  Fact Sheet for Patients:  EntrepreneurPulse.com.au  Fact Sheet for Healthcare Providers:   IncredibleEmployment.be  This test is no t yet approved or cleared by the [place] FDA and  has been  authorized for detection and/or diagnosis of SARS-CoV-2 by FDA under an Emergency Use Authorization (EUA). This EUA will remain  in effect (meaning this test can be used) for the duration of the COVID-19 declaration under Section 564(b)(1) of the Act, 21 U.S.C.section 360bbb-3(b)(1), unless the authorization is terminated  or revoked sooner.       Influenza A by PCR NEGATIVE NEGATIVE Final   Influenza B by PCR NEGATIVE NEGATIVE Final    Comment: (NOTE) The Xpert Xpress SARS-CoV-2/FLU/RSV plus assay is intended as an aid in the diagnosis of influenza from Nasopharyngeal swab specimens and should not be used as a sole basis for treatment. Nasal washings and aspirates are unacceptable for Xpert Xpress SARS-CoV-2/FLU/RSV testing.  Fact Sheet for Patients: EntrepreneurPulse.com.au  Fact Sheet for Healthcare Providers: IncredibleEmployment.be  This test is not yet approved or cleared by the [place] FDA and has been authorized for detection and/or diagnosis of SARS-CoV-2 by FDA under an Emergency Use Authorization (EUA). This EUA will remain in effect (meaning this test can be used) for the duration of the COVID-19 declaration under Section 564(b)(1) of the Act, 21 U.S.C. section 360bbb-3(b)(1), unless the authorization is terminated or revoked.  Performed at [facility], [place] [address] [place], [place] [address]   Blood culture (routine x 2)     Status: None (Preliminary result)   Collection Time: 10/12/21  5:49 PM   Specimen: BLOOD LEFT HAND  Result Value Ref Range Status   Specimen Description BLOOD LEFT HAND  Final   Special Requests   Final    BOTTLES DRAWN AEROBIC AND ANAEROBIC Blood Culture adequate volume   Culture   Final    NO GROWTH 3 DAYS Performed at [facility], [place] [address]  [place], [place] [address]    Report Status PENDING  Incomplete  Blood culture (routine x 2)     Status: None (Preliminary result)   Collection Time: 10/12/21  6:13 PM   Specimen: BLOOD LEFT FOREARM  Result Value Ref Range Status   Specimen Description BLOOD LEFT FOREARM  Final   Special Requests   Final    BOTTLES DRAWN AEROBIC AND ANAEROBIC Blood Culture adequate volume   Culture   Final    NO GROWTH 3 DAYS Performed at [facility], [place] [address] [place], [place] [address]    Report Status PENDING  Incomplete  MRSA Next Gen by PCR, Nasal     Status: None   Collection Time: 10/13/21  8:59 AM   Specimen: Nasal Mucosa; Nasal Swab  Result Value Ref Range Status   MRSA by PCR Next Gen NOT DETECTED NOT DETECTED Final    Comment: (NOTE) The GeneXpert MRSA Assay (FDA approved for NASAL specimens only), is one component of a comprehensive MRSA colonization surveillance program. It is not intended to diagnose MRSA infection nor to guide or monitor treatment for MRSA infections. Test performance is not FDA approved in patients less than 48 years old. Performed at [facility], [place] [address], [place],  [address]        Radiology Studies: DG CHEST PORT 1 VIEW  Result Date: 10/15/2021 CLINICAL DATA:  Shortness of breath. EXAM: PORTABLE CHEST 1 VIEW COMPARISON:  Portable chest today at 10:25 a.m. CTA chest 10/12/2021. FINDINGS: 2:45 a.m., 10/15/2021. there is a right upper lobe perihilar mass approaching 6 cm again shown. Splaying of the carina is consistent with subcarinal adenopathy known to be present. Ill-defined patchy airspace disease is  again noted in the left greater than right mid to lower lung fields and a moderate right pleural effusion with small left effusion. There is no new or interval worsening lung opacity. The cardiac size is normal. Right IJ port catheter terminates in the distal SVC. There is aortic atherosclerosis. No appreciable pneumothorax. IMPRESSION:  No new abnormality. No interval change in the bilateral lung opacities and pleural effusions, right upper lobe perihilar mass. Overall aeration seems unchanged. Electronically Signed   By: Telford Nab M.D.   On: 10/15/2021 03:03   DG CHEST PORT 1 VIEW  Result Date: 10/14/2021 CLINICAL DATA:  Hypoxia EXAM: PORTABLE CHEST 1 VIEW COMPARISON:  Previous studies including the examination of 10/12/2021 FINDINGS: Transverse diameter of heart is within normal limits. Thoracic aorta is ectatic. Right hilum is prominent. There is homogeneous opacity in the right parahilar region. These findings have not changed significantly. Increased interstitial markings are seen in the left mid and left lower lung fields with interval worsening of pneumonia. There is blunting of lateral CP angles. There is no pneumothorax. Tip of chest port is seen in the superior vena cava close to the right atrium. IMPRESSION: There is abnormal prominence of right hilum and homogeneous opacity in the right parahilar region suggesting possible pneumonia or neoplastic process. There is interval worsening of interstitial infiltrate in the left mid and left lower lung fields. Possible small bilateral pleural effusions. Electronically Signed   By: Elmer Picker M.D.   On: 10/14/2021 12:09       LOS: 2 days   [facility] Pager on [email or web address]  10/15/2021, 9:30 AM

## 2021-10-15 NOTE — Discharge Summary (Signed)
Physician Discharge Summary  Patient ID: Holly Weber MRN: [id number] DOB/AGE: Dec 17, 1965 56 y.o.  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  Had been in hospice care prior to admission but was made full code full code She was started on antibiotics for possible pneumonia which was present on admission  Metabolic encephalopathy secondary to medications  Neuroendocrine carcinoma metastatic to multiple sites including brain, lungs  With progressive disease and poor prognosis, discussions With family and decision was made to make patient comfort measures and home with hospice            SIGNIFICANT DIAGNOSTIC STUDIES CT scan of the chest with moderate right pleural effusion, Large mediastinal mass and adenopathy  MICRO DATA  Blood culture 10/12/2021 negative  ANTIBIOTICS Cefepime 1/21-1/24 Azithromycin 1/22 Doxycycline 1/23 Metronidazole 1/21-1/24 Vancomycin 1/21-1/22  CONSULTS Palliative care consulted 1/22 PCCM consulted 1/24   TUBES / LINES    Discharge Exam: General: Very frail Neuro: Lethargic CV: S1-S2 appreciated PULM:decreased AE GI:BS appreciated Extremities: no edema  Vitals:   10/15/21 1130 10/15/21 1200 10/15/21 1300 10/15/21 1400  BP: (!) 142/94 (!) 162/70 (!) 157/96 132/79  Pulse: (!) 116 (!) 119 (!) 126 (!) 119  Resp: 19 17 16 13   Temp:      TempSrc:      SpO2: 93% 92% 93% 93%  Weight:  58.5 kg    Height:         Discharge Labs  BMET Recent Labs  Lab 10/12/21 1603 10/12/21 1825  10/13/21 0437 10/14/21 1026 10/15/21 0322  NA 133* 125*   125* 133* 135 135  K 3.5 >8.5*   >8.5* 3.4* 3.8 3.2*  CL 89*  --  96* 96* 94*  CO2 26  --  28 23 26   GLUCOSE 208*  --  221* 129* 118*  BUN <5*  --  8 10 8   CREATININE 0.72  --  0.56 0.69 0.65  CALCIUM 9.1  --  8.8* 9.0 8.6*  MG  --   --  2.1 1.7  --     CBC Recent Labs  Lab 10/13/21 0437 10/14/21 1026 10/15/21 0322  HGB 15.2* 13.7 14.2  HCT 49.7* 45.3 45.2  WBC 19.7* 19.4* 14.2*  PLT 418* 411* 367    Anti-Coagulation No results for input(s): INR in the last 168 hours. Discharge Instructions     Change dressing (specify)   Complete by: As directed    Dressing change: once times per day using routine.   Diet - low sodium heart healthy   Complete by: As directed    Increase activity slowly   Complete by: As directed         Allergies as of 10/15/2021       Reactions   Asa [aspirin] Other (See Comments)   Ulcerative colitis flares.   Nsaids Other (See Comments)   Ulcerative colitis  flares   Peanut-containing Drug Products Other (See Comments)   Stomach upset   Penicillins Rash, Other (See Comments)   Has patient had a PCN reaction causing immediate rash, facial/tongue/throat swelling, SOB or lightheadedness with hypotension:unsure Has patient had a PCN reaction causing severe rash involving mucus membranes or skin necrosis:unsure Has patient had a PCN reaction that required hospitalization:No Has patient had a PCN reaction occurring within the last 10 years:Yes If all of the above answers are "NO", then may proceed with Cephalosporin use.        Medication List     TAKE these medications    acetaminophen 650 MG suppository Commonly known as: TYLENOL Place 1 suppository (650 mg total) rectally every 4 (four) hours as needed for fever or mild pain (headache).   albuterol 108 (90 Base) MCG/ACT inhaler Commonly known as: VENTOLIN HFA Inhale 2 puffs into the lungs every 4 (four) hours as needed  for shortness of breath or wheezing.   benzonatate 100 MG capsule Commonly known as: TESSALON Take 200 mg by mouth every 8 (eight) hours as needed for cough.   [employer] Antacid/Anti-Gas 200-200-20 MG/5ML suspension Generic drug: alum & mag hydroxide-simeth Take 15 mLs by mouth every 4 (four) hours as needed for indigestion or heartburn.   [employer] Cough DM 30 MG/5ML liquid Generic drug: dextromethorphan Take 5 mLs by mouth 2 (two) times daily as needed for cough.   fentaNYL 50 MCG/HR Commonly known as: [place] 1 patch onto the skin every 3 (three) days.   glycopyrrolate 1 MG tablet Commonly known as: ROBINUL Take 1 tablet (1 mg total) by mouth every 4 (four) hours as needed (excessive secretions).   guaiFENesin 100 MG/5ML liquid Commonly known as: ROBITUSSIN Take 10 mLs by mouth every 4 (four) hours as needed for cough or to loosen phlegm.   haloperidol 2 MG tablet Commonly known as: HALDOL Take 1 tablet (2 mg total) by mouth every 6 (six) hours as needed for agitation (or delirium). What changed:  medication strength how much to take when to take this reasons to take this   ipratropium-albuterol 0.5-2.5 (3) MG/3ML Soln Commonly known as: DUONEB Inhale 3 mLs into the lungs every 8 (eight) hours as needed (shortness of breath).   levETIRAcetam 500 MG tablet Commonly known as: KEPPRA TAKE 1 TABLET (500 MG TOTAL) BY MOUTH 2 (TWO) TIMES DAILY.   LORazepam 0.5 MG tablet Commonly known as: ATIVAN Take 0.5 mg by mouth every 4 (four) hours as needed for anxiety.   mesalamine 1.2 g EC tablet Commonly known as: LIALDA Take 2 tablets (2.4 g total) by mouth daily with breakfast.   methadone 5 MG tablet Commonly known as: DOLOPHINE Take 5 mg by mouth 3 (three) times daily.   methimazole 10 MG tablet Commonly known as: TAPAZOLE TAKE 2 TABLETS (20 MG TOTAL) BY MOUTH DAILY.   morphine 2 MG/ML injection Inject 2 mLs (4 mg total) into the vein every 30 (thirty) minutes as  needed (dyspnea, increased work of breathing, RR >25, distress).   ondansetron 4 MG tablet Commonly known as: ZOFRAN Take 1 tablet (4 mg total) by mouth every 6 (six) hours as needed for nausea. What changed:  medication strength how much to take when to take this reasons to take this   oxyCODONE 5 MG immediate release tablet Commonly known as: Oxy IR/ROXICODONE Take 1 tablet (5 mg total) by mouth every 4 (four) hours as needed for severe pain.   Oxycodone HCl 20 MG Tabs Take  1 tablet by mouth every 4 (four) hours as needed.   OXYGEN Inhale 4-5 L into the lungs continuous.   polyvinyl alcohol 1.4 % ophthalmic solution Commonly known as: LIQUIFILM TEARS Place 1 drop into both eyes 4 (four) times daily as needed for dry eyes.   potassium chloride SA 20 MEQ tablet Commonly known as: KLOR-CON M Take 1 tablet (20 mEq total) by mouth 2 (two) times daily.   predniSONE 10 MG tablet Commonly known as: DELTASONE Take 10 mg by mouth daily.   prochlorperazine 10 MG tablet Commonly known as: COMPAZINE TAKE 1 TABLET (10 MG TOTAL) BY MOUTH EVERY 4 (FOUR) HOURS AS NEEDED FOR NAUSEA OR VOMITING. MAY CRUSH, MIX WITH WATER AND GIVE SUBLIN<MORE>   propranolol 20 MG tablet Commonly known as: INDERAL TAKE 1 TABLET (20 MG TOTAL) BY MOUTH 2 (TWO) TIMES DAILY.   Zinc Oxide 40 % Pste Apply 1 application topically as needed (incontient episode).               Discharge Care Instructions  (From admission, onward)           Start     Ordered   10/15/21 0000  Change dressing (specify)       Comments: Dressing change: once times per day using routine.   10/15/21 1711              Disposition:  Discharge to [facility]  Discharged Condition: Holly Weber has met maximum benefit of inpatient care and is medically stable and cleared for discharge.  Patient is pending follow up as above.      Time spent on disposition:  40 Minutes.   Signed: Sherrilyn Rist,  MD [facility] [phone]

## 2021-10-15 NOTE — Assessment & Plan Note (Signed)
Noted to be on prednisone daily.  Continuing for now.

## 2021-10-15 NOTE — Progress Notes (Addendum)
Attempted to call [facility] to give report. No answer; will try again in 15 minutes.    Attempted to call [facility] several times. No answer.

## 2021-10-15 NOTE — Consult Note (Signed)
NAME:  Holly Weber, MRN:  [id number], DOB:  05-Apr-1966, LOS: 2 ADMISSION DATE:  10/12/2021, CONSULTATION DATE: 10/15/2021 REFERRING MD: [facility], CHIEF COMPLAINT: Hypoxic respiratory failure  History of Present Illness:  56 year old female with extensive past medical history of substance abuse IV cocaine IV drug abuse recent sexual transmitted diseases he was diagnosed in 72 with neuroendocrine tumor with metastases to the brain and lung.  She did not undergo hospice but is now been changed to a full CODE STATUS per her son and will be transferred to intensive care unit for further evaluation and treatment.  Pertinent  Medical History   Past Medical History:  Diagnosis Date   Cancer ([place])    Colitis, ulcerative ([place])    Depression    Hepatitis C    Hypertension    Stroke [facility])    Thyroid disease      Significant Hospital Events: Including procedures, antibiotic start and stop dates in addition to other pertinent events   Questionable transfer to intensive care unit 10/15/2020  Interim History / Subjective:  Worsening respiratory distress due to progressive neuroendocrine cancer  Objective   Blood pressure 134/90, pulse (!) 118, temperature 98 F (36.7 C), temperature source Axillary, resp. rate 18, SpO2 97 %.    FiO2 (%):  [55 %] 55 %  No intake or output data in the 24 hours ending 10/15/21 0745 There were no vitals filed for this visit.  Examination: General: Frail female looks much older than stated age HENT: Pupils equal react to light Lungs: Poor air excursion coarse rhonchi Cardiovascular: Heart sounds are distant Abdomen: Abdomen soft Extremities: With multiple areas of scabs Neuro: Lethargy  Resolved Hospital Problem list     Assessment & Plan:  Acute on chronic hypoxic respiratory failure in the setting of metastatic neuroendocrine tumor of the lung with metastases to the brain diagnosed on 04/07/2020 admission hospital in [facility].  CT of the  chest shows extensive mediastinal hilar as well as lower neck lymphadenopathy is noted to have 2 masses in the right lung associated with the pleura and 1 with the parenchyma.  She did receive 1 cycle of carboplatin/Eetoposide.  Most recently she has been in the hospice program and now has been to a full CODE STATUS per her son.  Currently on stepdown unit with increasing shortness of breath hypoxia pulmonary critical care asked to evaluate for possible transfer to the intensive care unit.  Note she has had continued drug abuse IV cocaine and narcotics and was recently treated for trichomonas and chlamydia Transferred intensive care unit if unable to establish no CODE BLUE status Continue oxygen Does not tolerate BiPAP Increase sedation as needed Doubt she could tolerate thoracentesis at this time. Most viable option is to remain in hospice with goals of care being comfort Meeting is scheduled for today with family and hospice.  Best Practice (right click and "Reselect all SmartList Selections" daily)   Diet/type: Regular consistency (see orders) DVT prophylaxis: not indicated GI prophylaxis: PPI Lines: N/A Foley:  N/A Code Status:  full code Last date of multidisciplinary goals of care discussion [tbd]  Labs   CBC: Recent Labs  Lab 10/12/21 1603 10/12/21 1825 10/13/21 0437 10/14/21 1026 10/15/21 0322  WBC 20.8*  --  19.7* 19.4* 14.2*  NEUTROABS 18.2*  --  17.5*  --   --   HGB 16.7* 17.7* 15.2* 13.7 14.2  HCT 53.1* 52.0* 49.7* 45.3 45.2  MCV 78.4*  --  79.1* 79.9* 79.2*  PLT 492*  --  418* 411* 321    Basic Metabolic Panel: Recent Labs  Lab 10/12/21 1603 10/12/21 1825 10/13/21 0437 10/14/21 1026 10/15/21 0322  NA 133* 125* 133* 135 135  K 3.5 >8.5* 3.4* 3.8 3.2*  CL 89*  --  96* 96* 94*  CO2 26  --  28 23 26   GLUCOSE 208*  --  221* 129* 118*  BUN <5*  --  8 10 8   CREATININE 0.72  --  0.56 0.69 0.65  CALCIUM 9.1  --  8.8* 9.0 8.6*  MG  --   --  2.1 1.7  --     GFR: CrCl cannot be calculated (Unknown ideal weight.). Recent Labs  Lab 10/12/21 1603 10/12/21 1722 10/12/21 2002 10/13/21 0437 10/14/21 1026 10/15/21 0322  WBC 20.8*  --   --  19.7* 19.4* 14.2*  LATICACIDVEN  --  2.0* 2.2*  --   --   --     Liver Function Tests: Recent Labs  Lab 10/12/21 1603 10/13/21 0437 10/14/21 1026  AST 20 12* 19  ALT 9 8 10   ALKPHOS 77 65 52  BILITOT 1.2 0.8 0.8  PROT 8.0 7.3 6.9  ALBUMIN 2.9* 2.5* 2.4*   No results for input(s): LIPASE, AMYLASE in the last 168 hours. No results for input(s): AMMONIA in the last 168 hours.  ABG    Component Value Date/Time   PHART 7.408 10/15/2021 0258   PCO2ART 45.6 10/15/2021 0258   PO2ART 59.1 (L) 10/15/2021 0258   HCO3 28.3 (H) 10/15/2021 0258   TCO2 34 (H) 10/12/2021 1825   O2SAT 90.6 10/15/2021 0258     Coagulation Profile: No results for input(s): INR, PROTIME in the last 168 hours.  Cardiac Enzymes: No results for input(s): CKTOTAL, CKMB, CKMBINDEX, TROPONINI in the last 168 hours.  HbA1C: Hgb A1c MFr Bld  Date/Time Value Ref Range Status  10/13/2021 06:16 PM 10.8 (H) 4.8 - 5.6 % Final    Comment:    (NOTE)         Prediabetes: 5.7 - 6.4         Diabetes: >6.4         Glycemic control for adults with diabetes: <7.0   07/14/2020 02:25 PM 7.0 (H) 4.8 - 5.6 % Final    Comment:    (NOTE)         Prediabetes: 5.7 - 6.4         Diabetes: >6.4         Glycemic control for adults with diabetes: <7.0     CBG: Recent Labs  Lab 10/14/21 1150 10/14/21 1659 10/14/21 2122 10/15/21 0028 10/15/21 0541  GLUCAP 116* 178* 111* 110* 119*    Review of Systems:   na  Past Medical History:  She,  has a past medical history of Cancer ([place]), Colitis, ulcerative ([place]), Depression, Hepatitis C, Hypertension, Stroke ([place]), and Thyroid disease.   Surgical History:   Past Surgical History:  Procedure Laterality Date   ARM WOUND REPAIR / CLOSURE     COLONOSCOPY     OPEN REDUCTION INTERNAL  FIXATION (ORIF) DISTAL RADIAL FRACTURE Left 01/07/2018   Procedure: OPEN REDUCTION INTERNAL FIXATION (ORIF) DISTAL RADIAL FRACTURE;  Surgeon: Leanora Cover, MD;  Location: [place];  Service: Orthopedics;  Laterality: Left;   TEE WITHOUT CARDIOVERSION N/A 12/17/2016   Procedure: TRANSESOPHAGEAL ECHOCARDIOGRAM (TEE);  Surgeon: Josue Hector, MD;  Location: [place];  Service: Cardiovascular;  Laterality: N/A;   TONSILLECTOMY  Social History:   reports that she has been smoking cigarettes. She has been smoking an average of 1.5 packs per day. She has never used smokeless tobacco. She reports current alcohol use. She reports current drug use. Drugs: Cocaine and Marijuana.   Family History:  Her family history includes Hypertension in her father and mother.   Allergies Allergies  Allergen Reactions   Asa [Aspirin] Other (See Comments)    Ulcerative colitis flares.   Nsaids Other (See Comments)    Ulcerative colitis flares   Peanut-Containing Drug Products Other (See Comments)    Stomach upset   Penicillins Rash and Other (See Comments)    Has patient had a PCN reaction causing immediate rash, facial/tongue/throat swelling, SOB or lightheadedness with hypotension:unsure Has patient had a PCN reaction causing severe rash involving mucus membranes or skin necrosis:unsure Has patient had a PCN reaction that required hospitalization:No Has patient had a PCN reaction occurring within the last 10 years:Yes If all of the above answers are "NO", then may proceed with Cephalosporin use.      Home Medications  Prior to Admission medications   Medication Sig Start Date End Date Taking? Authorizing Provider  albuterol (VENTOLIN HFA) 108 (90 Base) MCG/ACT inhaler Inhale 2 puffs into the lungs every 4 (four) hours as needed for shortness of breath or wheezing. 08/08/21  Yes [provider]  benzonatate (TESSALON) 100 MG capsule Take 200 mg by mouth every 8 (eight)  hours as needed for cough. 10/03/21  Yes [provider]  CVS ANTACID/ANTI-GAS 200-200-20 MG/5ML suspension Take 15 mLs by mouth every 4 (four) hours as needed for indigestion or heartburn. 10/08/21  Yes [provider]  CVS COUGH DM 30 MG/5ML liquid Take 5 mLs by mouth 2 (two) times daily as needed for cough. 05/09/21  Yes [provider]  fentaNYL (DURAGESIC) 50 MCG/HR Place 1 patch onto the skin every 3 (three) days. 10/06/21  Yes [provider]  guaiFENesin (ROBITUSSIN) 100 MG/5ML liquid Take 10 mLs by mouth every 4 (four) hours as needed for cough or to loosen phlegm.   Yes [provider]  ipratropium-albuterol (DUONEB) 0.5-2.5 (3) MG/3ML SOLN Inhale 3 mLs into the lungs every 8 (eight) hours as needed (shortness of breath). 07/25/21  Yes [provider]  LORazepam (ATIVAN) 0.5 MG tablet Take 0.5 mg by mouth every 4 (four) hours as needed for anxiety.   Yes [provider]  methadone (DOLOPHINE) 5 MG tablet Take 5 mg by mouth 3 (three) times daily. 09/24/21  Yes [provider]  ondansetron (ZOFRAN) 8 MG tablet Take 1 tablet (8 mg total) by mouth every 8 (eight) hours as needed for nausea or vomiting. 08/03/20  Yes Orson Slick, MD  Oxycodone HCl 20 MG TABS Take 1 tablet by mouth every 4 (four) hours as needed. 10/08/21  Yes [provider]  OXYGEN Inhale 4-5 L into the lungs continuous.   Yes [provider]  predniSONE (DELTASONE) 10 MG tablet Take 10 mg by mouth daily. 09/17/21  Yes [provider]  Zinc Oxide 40 % PSTE Apply 1 application topically as needed (incontient episode).   Yes [provider]  haloperidol (HALDOL) 5 MG tablet Take 5 mg by mouth every 4 (four) hours as needed. 10/08/21   [provider]  levETIRAcetam (KEPPRA) 500 MG tablet TAKE 1 TABLET (500 MG TOTAL) BY MOUTH 2 (TWO) TIMES DAILY. Patient not taking: Reported on 10/13/2021 07/18/20 07/18/21  [place] Eric  J,  DO  mesalamine (LIALDA) 1.2 g EC tablet Take 2 tablets (2.4 g total) by mouth daily with breakfast. Patient not taking: Reported on 08/29/2020 07/19/20 10/17/20  [place] Donnamarie Poag, DO  methimazole (TAPAZOLE) 10 MG tablet TAKE 2 TABLETS (20 MG TOTAL) BY MOUTH DAILY. Patient not taking: Reported on 10/13/2021 07/18/20 07/18/21  [place] Eric J, DO  oxyCODONE (OXY IR/ROXICODONE) 5 MG immediate release tablet Take 1 tablet (5 mg total) by mouth every 4 (four) hours as needed for severe pain. Patient not taking: Reported on 10/13/2021 08/17/20   Orson Slick, MD  potassium chloride SA (KLOR-CON) 20 MEQ tablet Take 1 tablet (20 mEq total) by mouth 2 (two) times daily. Patient not taking: Reported on 08/29/2020 08/03/20   Orson Slick, MD  prochlorperazine (COMPAZINE) 10 MG tablet TAKE 1 TABLET (10 MG TOTAL) BY MOUTH EVERY 4 (FOUR) HOURS AS NEEDED FOR NAUSEA OR VOMITING. MAY CRUSH, MIX WITH WATER AND GIVE SUBLIN<MORE> Patient not taking: Reported on 10/13/2021 07/18/20 07/18/21  [place] Donnamarie Poag, DO  propranolol (INDERAL) 20 MG tablet TAKE 1 TABLET (20 MG TOTAL) BY MOUTH 2 (TWO) TIMES DAILY. Patient not taking: Reported on 10/13/2021 07/18/20 07/18/21  [place] Eric J, DO     Critical care time: 32 min  Richardson Landry Tarisha Fader ACNP Acute Care Nurse Practitioner [place] Please consult Amion 10/15/2021, 7:45 AM

## 2021-10-15 NOTE — Progress Notes (Signed)
Pt safely transferred to 2M06. Report & HOC given to RN Maudie Mercury. All questions and concerns addressed.

## 2021-10-15 NOTE — Progress Notes (Signed)
Was asked to see the patient who was hospice but now full code due to desaturation. Patient is on venti mask w pox 96% not in distress but very frail and occasionally gets agitated. Spoke with son Holly Weber at bedside to get his siblings ASAP to discuss with morning tam about goals of care since he can not make a decision himself.   Consult note will be done by morning ICU team.

## 2021-10-15 NOTE — Progress Notes (Signed)
At [place], pt c/o unable to breathe on 14L Venturi mast, O2sat 88%. RT was called to request breathing treatment. At 0200, pt again c/o unable to breathe, stating albuterol neb did not help; pt became agitated, trying to climb out of bed, O2sat 88-92%, RR 25-29, HR 120s. Paged on call physician at [place], ABG and CXR ordered and obtained, physician assessed pt and pt's son updated at bedside.  BiPAP was ordered; pt unable to tolerate and continued to take off mask; Venturi mask reapplied at 14L at 0455. Paged physician to notify. Awaiting callback.

## 2021-10-15 NOTE — Progress Notes (Signed)
Daily Progress Note   Patient Name: Holly Weber       Date: 10/15/2021 DOB: [birth year]  Age: 56 y.o. MRN#: [id number] Attending Physician: Laurin Coder, MD Primary Care Physician: Patient, No Pcp Per (Inactive) Admit Date: 10/12/2021  Reason for Consultation/Follow-up: Establishing goals of care  Subjective: Chart review performed. Discussed case with Dr. Carylon Perches Medical Director.   Discussed case with [facility] hospice liaison - reviewed prior [employer] conversations had with hospice staff and patient.   1:30 PM Went to visit patient at bedside - family/visitors arriving for family meeting. Patient was lying in bed asleep - she briefly opens her eyes to voice/gentle touch. Signs/non-verbal gestures of discomfort noted. No respiratory distress, increased work of breathing, or secretions noted. She is on venimask.   Met 7 family members in 25M conference room along with Dr. Olalere/CCM - sister/[name], sister/Tina, friend/Diana and her adult daughter, aunt/Sharon, son/Shelton, son/Samuel. Was later in the meeting joined by other sons [place] and [place]. Sister/Tina provided copies of HCPOA listing herself as patient's decision maker.  Dr. Ander Slade reviewed medical updates. We discussed patient's current illness and what it means in the larger context of patient's on-going co-morbidities and metastatic cancer in detail.  Natural disease trajectory and expectations at EOL were discussed also in detail. I attempted to elicit values and goals of care important to the patient. The difference between aggressive medical intervention and comfort care was considered in light of the patient's goals of care. Reviewed that it is anticipated patient will continue to decline and comfort care was recommended. We  discussed that time is limited no matter which path is chosen (aggressive vs comfort) and I encouraged the family to consider how she would want to spend her last days to week. Family feel the hospital is a "depressing environment" and would want her to be able to see the sun and nature.   Residential hospice was discussed in detail, including their rooms with windows and visible gardens. Family feel this environment is what the patient would want but worry she may pass away in transport. Provided reassurance that residential hospice referral could be cancelled (would anticipate hospital death) at any time if patient's condition changed and it was felt they were too unstable for transfer, but it is not guaranteed she would not pass. Family are agreeable  to [employer] transfer.  Family are clear they would want the patient to have a peaceful passing. Family were agreeable to DNR/DNI with understanding that she would not receive CPR, defibrillation, ACLS medications, or intubation.   Visit also consisted of discussions dealing with the complex and emotionally intense issues of symptom management and palliative care in the setting of serious and potentially life-threatening illness.   Discussed with patient/family the importance of continued conversation with each other and the medical providers regarding overall plan of care and treatment options, ensuring decisions are within the context of the patients values and GOCs.    Questions and concerns were addressed. The patient/family was encouraged to call with questions and/or concerns. PMT card was provided.  Went back to patient's bedside with two sons - discussed comfort care in front of patient and she nodded her head as if in agreement and son seemed to find peace in this.   Called [facility] hospice liaison to provide updates from meeting today and request liaison visit with family.   Length of Stay: 2  Current Medications: Scheduled Meds:    enoxaparin (LOVENOX) injection  30 mg Subcutaneous Q24H   insulin aspart  0-15 Units Subcutaneous Q4H   methadone  5 mg Oral TID   predniSONE  10 mg Oral Daily    Continuous Infusions:  ceFEPime (MAXIPIME) IV 2 g (10/15/21 0735)   doxycycline (VIBRAMYCIN) IV 100 mg (10/15/21 0531)   metronidazole 500 mg (10/15/21 0933)   potassium chloride 10 mEq (10/15/21 1102)    PRN Meds: acetaminophen, albuterol, benzonatate, haloperidol lactate, ondansetron **OR** ondansetron (ZOFRAN) IV, oxyCODONE  Physical Exam Vitals and nursing note reviewed.  Constitutional:      General: She is not in acute distress.    Appearance: She is ill-appearing.  Pulmonary:     Effort: No respiratory distress.  Skin:    General: Skin is warm and dry.  Neurological:     Mental Status: She is lethargic.     Motor: Weakness present.  Psychiatric:        Mood and Affect: Mood is anxious.        Speech: She is noncommunicative.        Cognition and Memory: Cognition is impaired. Memory is impaired.            Vital Signs: BP (!) 175/94 (BP Location: Right Arm)    Pulse (!) 122    Temp 98 F (36.7 C) (Oral)    Resp 18    Ht 5' 4"  (1.626 m)    SpO2 (!) 89%    BMI 28.00 kg/m  SpO2: SpO2: (!) 89 % O2 Device: O2 Device: Venturi Mask O2 Flow Rate: O2 Flow Rate (L/min): 14 L/min  Intake/output summary: No intake or output data in the 24 hours ending 10/15/21 1155 LBM: Last BM Date: 10/12/21 Baseline Weight:   Most recent weight:         Palliative Assessment/Data: PPS 10%      Patient Active Problem List   Diagnosis Date Noted   Current chronic use of systemic steroids 10/15/2021   DM (diabetes mellitus), type 2 ([place]) 16/09/930   Acute metabolic encephalopathy 35/57/3220   Electrolyte abnormality 10/13/2021   Acute respiratory failure ([place]) 10/13/2021   Pressure injury of skin 10/13/2021   Acute on chronic respiratory failure with hypoxia ([place]) 10/12/2021   Sinus tachycardia  07/18/2020   Colitis 07/17/2020   Hospice care patient - yet remains a FULL CODE    Seizure (  [place]) 07/13/2020   Hyperthyroidism 07/13/2020   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 She was hypoxic with O2 saturation in the 70's and subsequently placed on BiPAP in the ED. CT chest concerning for pneumonia and also showed significantly worsening mediastinal adenopathy as well as worsening masses in the right upper lobe concerning for worsening lung cancer.   Cancer diagnosed in July 2021. She received 1 cycle of chemotherapy, but did not have interest in pursuing further treatment. In October 2021, sh was released early from prison on medical leave.  She established with [facility], but her last oncology visit was December 2021.   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 IV drug user Hospice care patient Terminal care  Recommendations/Plan: Initiated full comfort measures Now DNR/DNI Family interested in patient transfer to [facility] liaison and TOC notified,  TOC consult placed Added orders for EOL symptom management and to reflect full comfort measures, as well as discontinued orders that were not focused on comfort Restarted fentanyl 22mg patch Continue methadone, prednisone, benzonatate as long as she's able to tolerate POs Started morphine PRN pain/dyspnea, robinul PRN secretions, haldol PRN agitation, ativan PRN anxiety, biotene BID mouth care, liquifilm tears PRN dry eyes Unrestricted visitation orders were placed per current [place] policy  Copy of HCPOA obtained - listed sister/Tina Dimorier - will be scanned into Vynca/ACP tab. Nursing to provide frequent assessments and administer PRN medications as clinically necessary to ensure EOL comfort PMT will continue to follow and support holistically   Goals of Care and Additional Recommendations: Limitations on Scope of Treatment: Full Comfort Care  Code Status:    Code Status Orders  (From admission, onward)           Start     Ordered   10/13/21 0437  Full code  Continuous        10/13/21 0436           Code Status History     Date Active Date Inactive Code Status Order ID Comments User Context   07/17/2020 2326 07/18/2020 1839 Full Code [id number] Cox, ABriant Cedar DO ED   07/13/2020 0825 07/14/2020 2036 Full Code [id number] YKarmen Bongo MD ED   10/19/2018 0328 10/21/2018 2000 Full Code [id number] DToy Baker MD ED   12/27/2016 0544 12/30/2016 1631 Full Code [id number] GEtta Quill DO ED   12/22/2016 0903 12/24/2016 0724 Full Code [id number] MWaldemar Dickens MD ED   12/13/2016 1701 12/22/2016 0122 Full Code [id number] LMaren Reamer MD ED       Prognosis:  < 2 weeks  Discharge Planning: Hospice  facility  Care plan was discussed with primary RN, patient's family, Dr. Ander Slade, [facility], [facility] hospice liaison  Thank you for allowing the Palliative Medicine Team to assist in the care of this patient.   Total Time 120 minutes Prolonged  Time Billed  yes       Greater than 50%  of this time was spent counseling and coordinating care related to the above assessment and plan.  Lin Landsman, NP  Please contact Palliative Medicine Team phone at [phone] for questions and concerns.

## 2021-10-17 LAB — CULTURE, BLOOD (ROUTINE X 2)
Culture: NO GROWTH
Culture: NO GROWTH
Special Requests: ADEQUATE
Special Requests: ADEQUATE

## 2021-10-21 ENCOUNTER — Telehealth: Payer: Self-pay

## 2021-10-21 NOTE — Telephone Encounter (Signed)
Fax notification received from [facility] advising pt passed away on 2021/11/12 at 6:31pm.

## 2021-10-23 DEATH — deceased
